# Patient Record
Sex: Female | Born: 1950
Health system: Southern US, Community
[De-identification: ages and names within clinical notes are randomized; demographics above are authoritative.]

## PROBLEM LIST (undated history)

## (undated) DIAGNOSIS — R011 Cardiac murmur, unspecified: Secondary | ICD-10-CM

## (undated) DIAGNOSIS — F419 Anxiety disorder, unspecified: Secondary | ICD-10-CM

## (undated) DIAGNOSIS — M1990 Unspecified osteoarthritis, unspecified site: Secondary | ICD-10-CM

## (undated) DIAGNOSIS — I10 Essential (primary) hypertension: Secondary | ICD-10-CM

## (undated) DIAGNOSIS — M109 Gout, unspecified: Secondary | ICD-10-CM

## (undated) DIAGNOSIS — M797 Fibromyalgia: Secondary | ICD-10-CM

## (undated) DIAGNOSIS — E119 Type 2 diabetes mellitus without complications: Secondary | ICD-10-CM

## (undated) DIAGNOSIS — T7840XA Allergy, unspecified, initial encounter: Secondary | ICD-10-CM

## (undated) DIAGNOSIS — E785 Hyperlipidemia, unspecified: Secondary | ICD-10-CM

## (undated) DIAGNOSIS — I6529 Occlusion and stenosis of unspecified carotid artery: Secondary | ICD-10-CM

## (undated) DIAGNOSIS — K219 Gastro-esophageal reflux disease without esophagitis: Secondary | ICD-10-CM

## (undated) HISTORY — PX: BREAST EXCISIONAL BIOPSY: SUR124

## (undated) HISTORY — DX: Fibromyalgia: M79.7

## (undated) HISTORY — PX: CHOLECYSTECTOMY: SHX55

## (undated) HISTORY — DX: Gastro-esophageal reflux disease without esophagitis: K21.9

## (undated) HISTORY — DX: Cardiac murmur, unspecified: R01.1

## (undated) HISTORY — DX: Anxiety disorder, unspecified: F41.9

## (undated) HISTORY — PX: JOINT REPLACEMENT: SHX530

## (undated) HISTORY — DX: Unspecified osteoarthritis, unspecified site: M19.90

## (undated) HISTORY — PX: EYE SURGERY: SHX253

## (undated) HISTORY — DX: Gout, unspecified: M10.9

## (undated) HISTORY — DX: Occlusion and stenosis of unspecified carotid artery: I65.29

## (undated) HISTORY — DX: Type 2 diabetes mellitus without complications: E11.9

## (undated) HISTORY — DX: Allergy, unspecified, initial encounter: T78.40XA

## (undated) HISTORY — PX: ABDOMINAL HYSTERECTOMY: SHX81

---
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
## 1998-12-06 ENCOUNTER — Other Ambulatory Visit: Admission: RE | Admit: 1998-12-06 | Discharge: 1998-12-06 | Payer: Self-pay | Admitting: Obstetrics and Gynecology

## 2000-05-03 ENCOUNTER — Emergency Department (HOSPITAL_COMMUNITY): Admission: EM | Admit: 2000-05-03 | Discharge: 2000-05-03 | Payer: Self-pay | Admitting: Internal Medicine

## 2004-04-30 ENCOUNTER — Emergency Department (HOSPITAL_COMMUNITY): Admission: EM | Admit: 2004-04-30 | Discharge: 2004-04-30 | Payer: Self-pay | Admitting: Emergency Medicine

## 2005-04-23 ENCOUNTER — Encounter: Admission: RE | Admit: 2005-04-23 | Discharge: 2005-04-23 | Payer: Self-pay | Admitting: General Surgery

## 2005-04-24 ENCOUNTER — Ambulatory Visit (HOSPITAL_BASED_OUTPATIENT_CLINIC_OR_DEPARTMENT_OTHER): Admission: RE | Admit: 2005-04-24 | Discharge: 2005-04-24 | Payer: Self-pay | Admitting: General Surgery

## 2005-04-24 ENCOUNTER — Ambulatory Visit (HOSPITAL_COMMUNITY): Admission: RE | Admit: 2005-04-24 | Discharge: 2005-04-24 | Payer: Self-pay | Admitting: General Surgery

## 2005-04-24 ENCOUNTER — Encounter (INDEPENDENT_AMBULATORY_CARE_PROVIDER_SITE_OTHER): Payer: Self-pay | Admitting: *Deleted

## 2006-06-26 ENCOUNTER — Ambulatory Visit: Payer: Self-pay | Admitting: Gastroenterology

## 2006-07-10 ENCOUNTER — Ambulatory Visit: Payer: Self-pay | Admitting: Gastroenterology

## 2007-12-15 ENCOUNTER — Encounter: Admission: RE | Admit: 2007-12-15 | Discharge: 2008-02-10 | Payer: Self-pay | Admitting: Internal Medicine

## 2009-02-04 ENCOUNTER — Ambulatory Visit: Payer: Self-pay | Admitting: Cardiovascular Disease

## 2009-02-05 ENCOUNTER — Encounter (INDEPENDENT_AMBULATORY_CARE_PROVIDER_SITE_OTHER): Payer: Self-pay | Admitting: *Deleted

## 2009-02-05 ENCOUNTER — Observation Stay (HOSPITAL_COMMUNITY): Admission: EM | Admit: 2009-02-05 | Discharge: 2009-02-06 | Payer: Self-pay | Admitting: Emergency Medicine

## 2009-06-03 ENCOUNTER — Encounter: Admission: RE | Admit: 2009-06-03 | Discharge: 2009-06-03 | Payer: Self-pay | Admitting: Internal Medicine

## 2009-08-09 ENCOUNTER — Ambulatory Visit (HOSPITAL_COMMUNITY): Admission: RE | Admit: 2009-08-09 | Discharge: 2009-08-10 | Payer: Self-pay | Admitting: Orthopedic Surgery

## 2010-04-10 IMAGING — CR DG CHEST 2V
2 series · 2 of 2 positions shown · non-contrast
Comparison: 04/23/2005

CLINICAL DATA: Chest pain for several days, worst tonight

CHEST - 2 VIEW

[w chest pa]
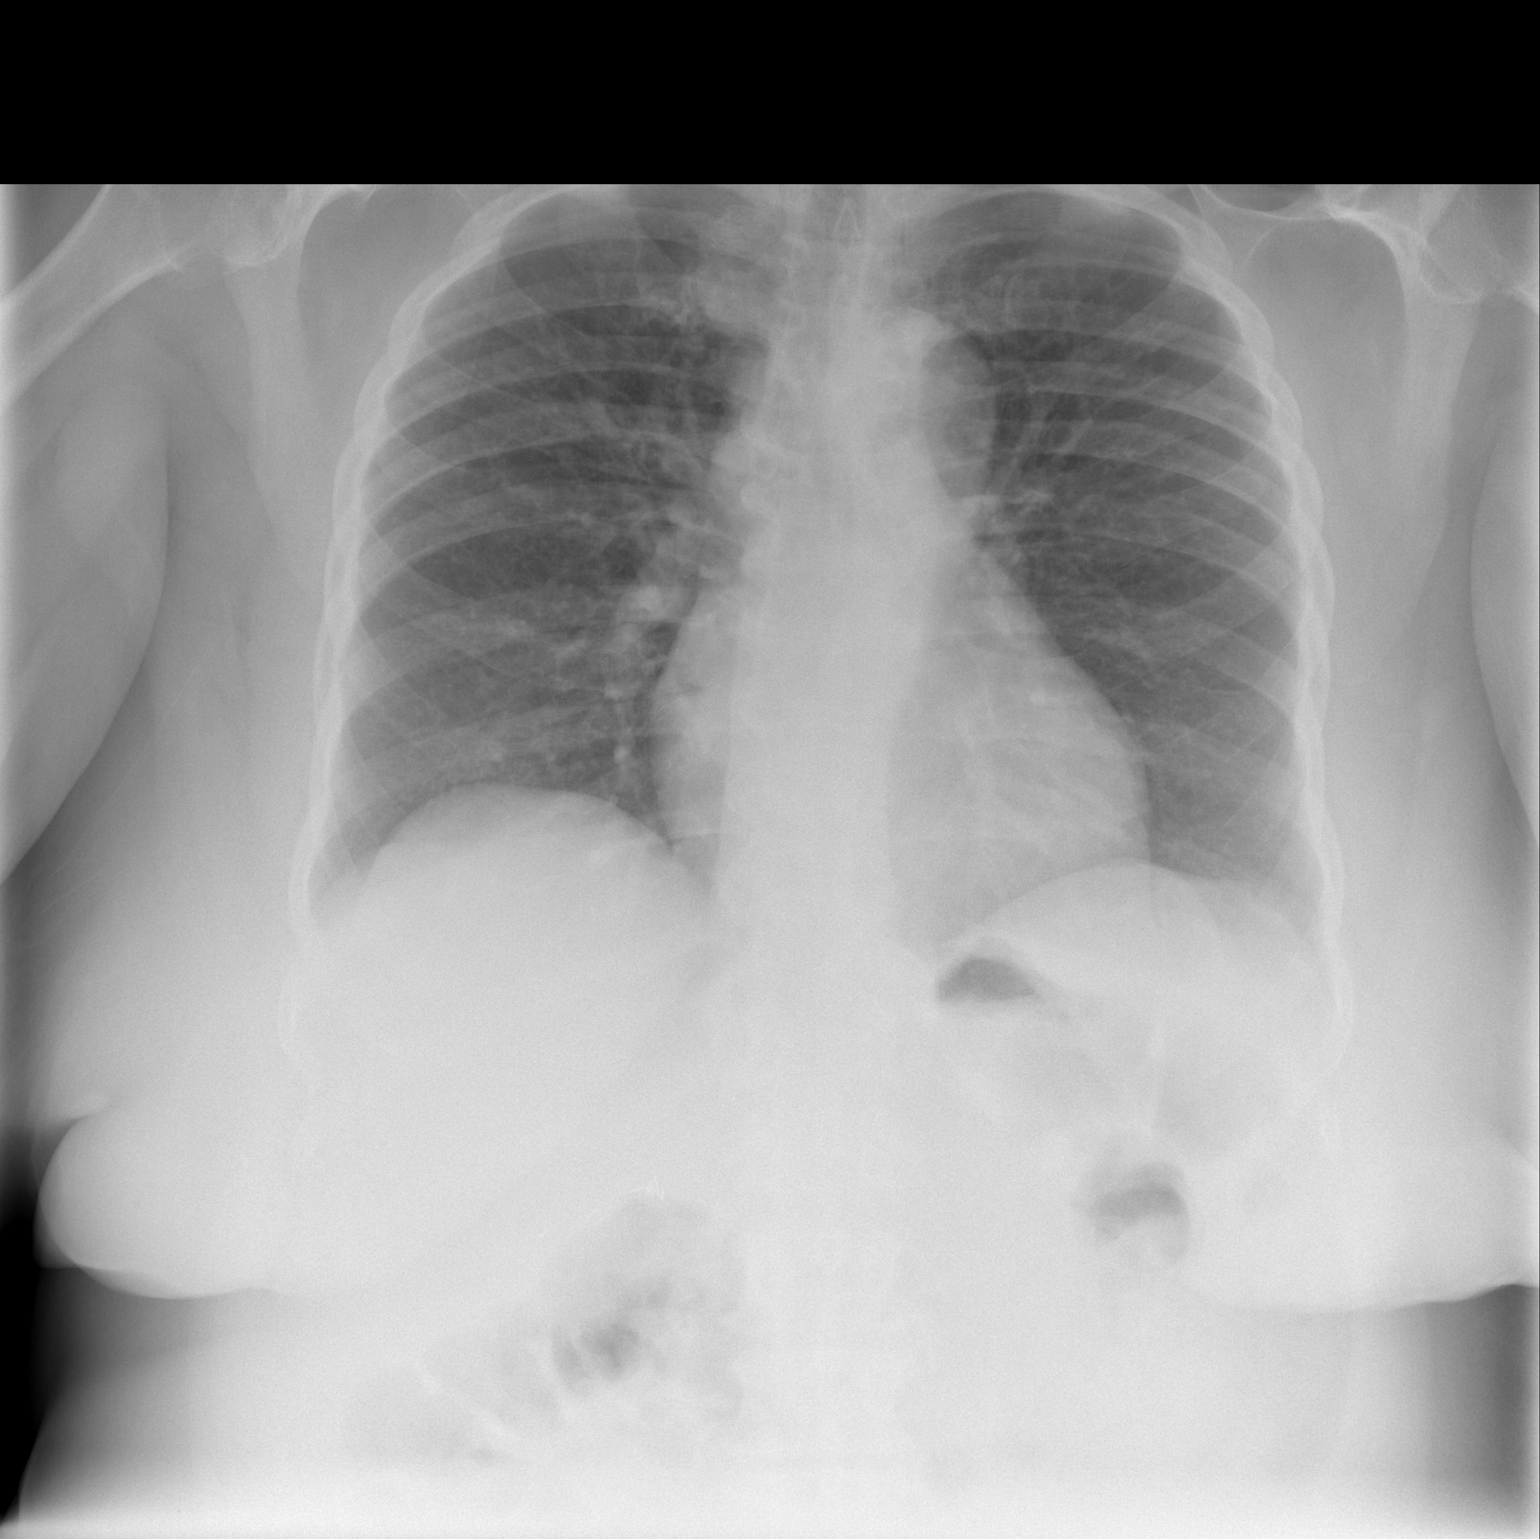

[w chest lat]
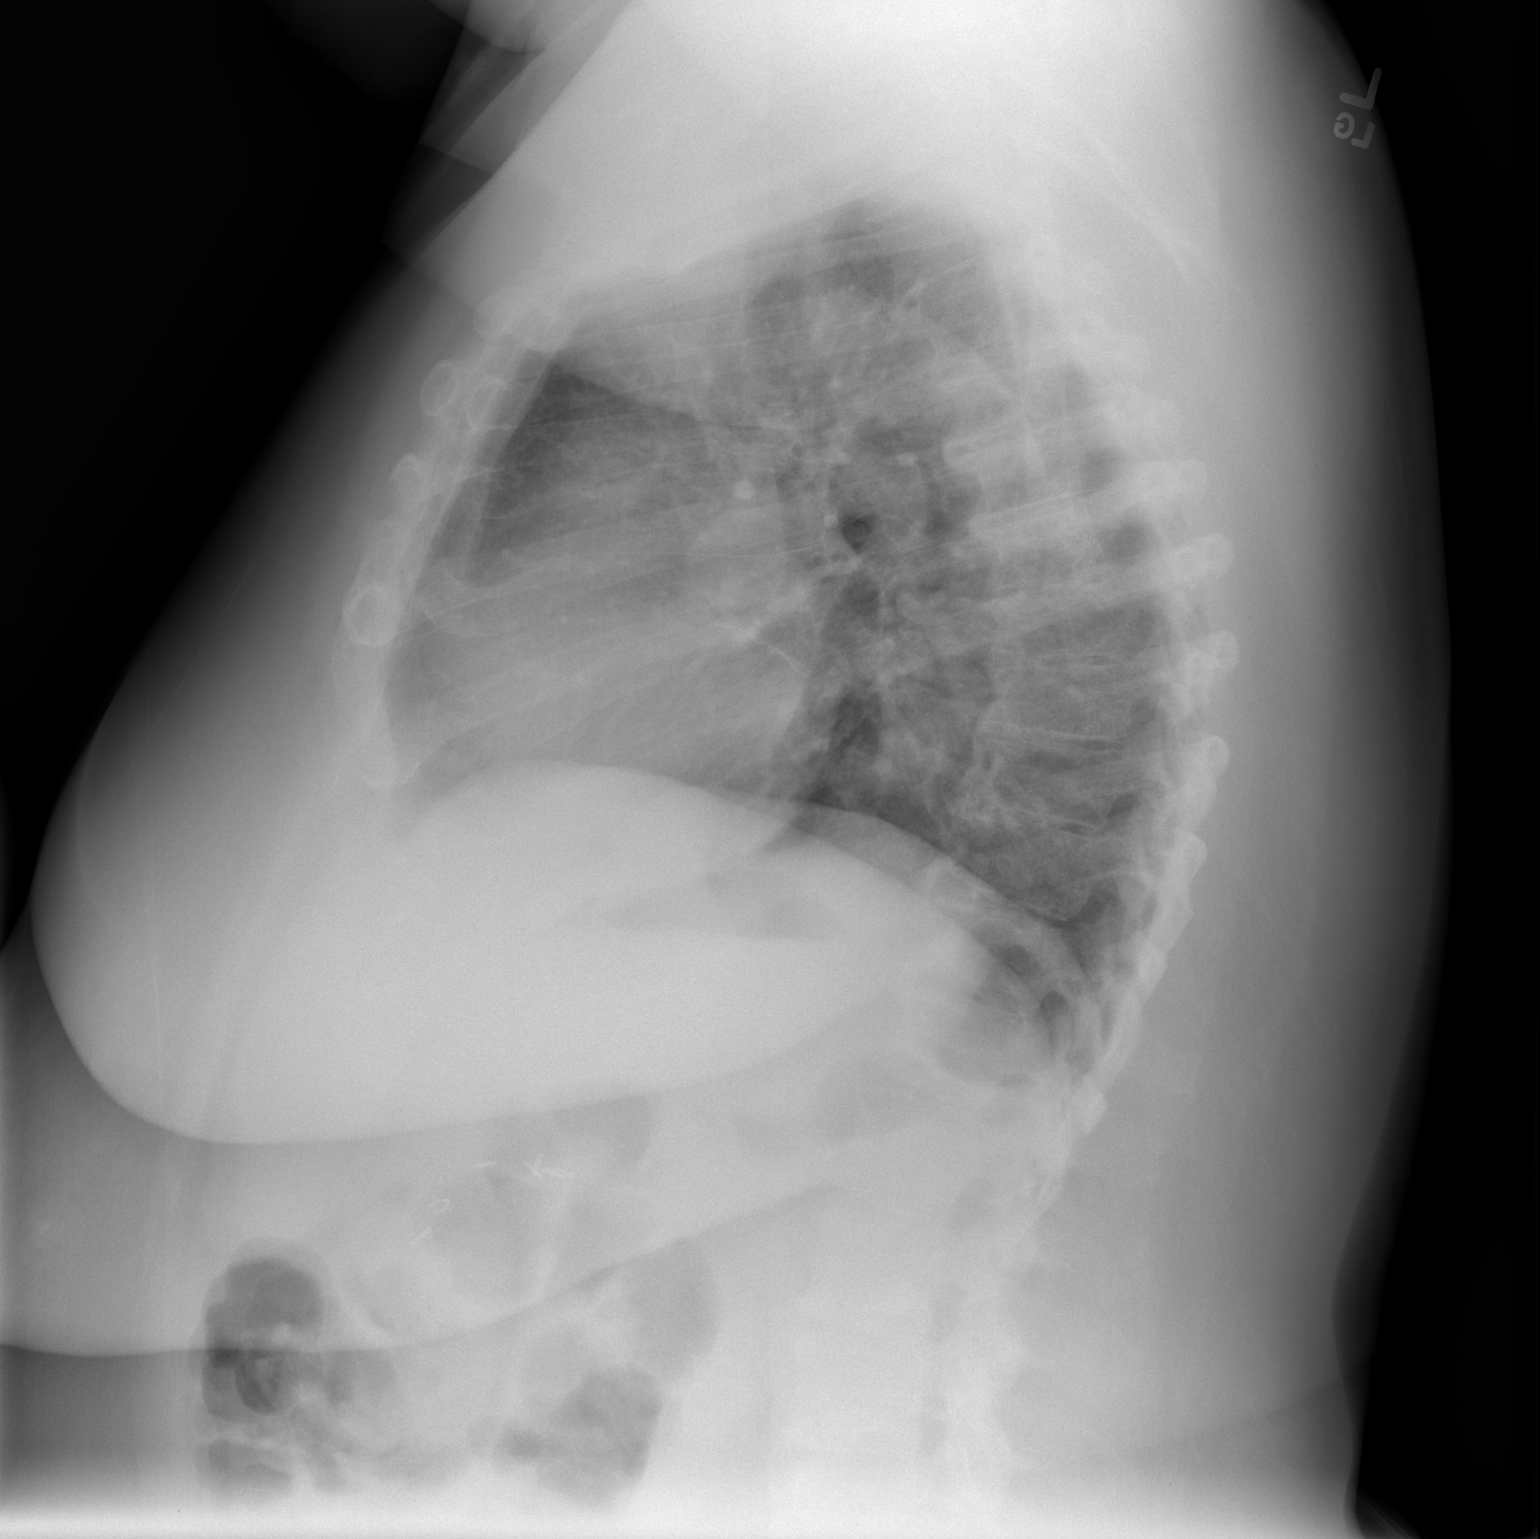

[2 of 2 positions shown; findings below may reference images not displayed]

FINDINGS: Mild cardiomegaly.  Mild vascular congestion without
overt failure or pneumonia.  Calcified tortuous aorta.  Bones
unremarkable. Previous cholecystectomy. Worsening aeration compared
with priors.
IMPRESSION: Cardiomegaly.  Mild vascular congestion without frank CHF.

## 2011-01-23 LAB — BASIC METABOLIC PANEL
CO2: 29 mEq/L (ref 19–32)
GFR calc Af Amer: >60 mL/min (ref >60)
Glucose, Bld: 138 mg/dL — ABNORMAL HIGH (ref 70–99)

## 2011-01-23 LAB — GLUCOSE, CAPILLARY: Glucose-Capillary: 112 mg/dL — ABNORMAL HIGH (ref 70–99)

## 2011-01-23 LAB — COMPREHENSIVE METABOLIC PANEL
ALT: 23 U/L (ref 0–35)
AST: 27 U/L (ref 0–37)
Albumin: 3.9 g/dL (ref 3.5–5.2)
Alkaline Phosphatase: 81 U/L (ref 39–117)
Calcium: 9.8 mg/dL (ref 8.4–10.5)
Creatinine, Ser: 0.82 mg/dL (ref 0.4–1.2)
GFR calc non Af Amer: >60 mL/min (ref >60)
Glucose, Bld: 80 mg/dL (ref 70–99)
Total Bilirubin: 0.4 mg/dL (ref 0.3–1.2)
Total Protein: 7.1 g/dL (ref 6.0–8.3)

## 2011-01-23 LAB — URINALYSIS, ROUTINE W REFLEX MICROSCOPIC
Glucose, UA: NEGATIVE mg/dL
Ketones, ur: NEGATIVE mg/dL
Nitrite: NEGATIVE
Protein, ur: NEGATIVE mg/dL
Specific Gravity, Urine: 1.018 (ref 1.005–1.030)
pH: 5.5 (ref 5.0–8.0)

## 2011-01-23 LAB — URINE MICROSCOPIC-ADD ON

## 2011-01-23 LAB — CBC: RDW: 14.1 % (ref 11.5–15.5)

## 2011-01-23 LAB — APTT: aPTT: 25 seconds (ref 24–37)

## 2011-01-29 LAB — LIPID PANEL
Cholesterol: 169 mg/dL (ref 0–200)
HDL: 35 mg/dL — ABNORMAL LOW (ref >39)
LDL Cholesterol: 115 mg/dL — ABNORMAL HIGH (ref 0–99)
Total CHOL/HDL Ratio: 4.8 RATIO
Triglycerides: 94 mg/dL (ref <150)

## 2011-01-29 LAB — BASIC METABOLIC PANEL
BUN: 7 mg/dL (ref 6–23)
CO2: 29 mEq/L (ref 19–32)
Calcium: 9.4 mg/dL (ref 8.4–10.5)
Glucose, Bld: 263 mg/dL — ABNORMAL HIGH (ref 70–99)
Sodium: 137 mEq/L (ref 135–145)

## 2011-01-29 LAB — GLUCOSE, CAPILLARY
Glucose-Capillary: 198 mg/dL — ABNORMAL HIGH (ref 70–99)
Glucose-Capillary: 263 mg/dL — ABNORMAL HIGH (ref 70–99)
Glucose-Capillary: 264 mg/dL — ABNORMAL HIGH (ref 70–99)

## 2011-01-29 LAB — URINE MICROSCOPIC-ADD ON

## 2011-01-29 LAB — COMPREHENSIVE METABOLIC PANEL
AST: 29 U/L (ref 0–37)
Alkaline Phosphatase: 115 U/L (ref 39–117)
Calcium: 9.2 mg/dL (ref 8.4–10.5)
Chloride: 102 mEq/L (ref 96–112)
Sodium: 137 mEq/L (ref 135–145)
Total Bilirubin: 1.2 mg/dL (ref 0.3–1.2)
Total Protein: 7 g/dL (ref 6.0–8.3)

## 2011-01-29 LAB — URINALYSIS, ROUTINE W REFLEX MICROSCOPIC
Glucose, UA: >1000 mg/dL — AB
Hgb urine dipstick: NEGATIVE
Leukocytes, UA: NEGATIVE
pH: 6 (ref 5.0–8.0)

## 2011-01-29 LAB — POCT CARDIAC MARKERS
CKMB, poc: 6.8 ng/mL (ref 1.0–8.0)
CKMB, poc: 8.7 ng/mL (ref 1.0–8.0)
Myoglobin, poc: 112 ng/mL (ref 12–200)
Troponin i, poc: <0.05 ng/mL (ref 0.00–0.09)

## 2011-01-29 LAB — CK TOTAL AND CKMB (NOT AT ARMC)
CK, MB: 9.3 ng/mL — ABNORMAL HIGH (ref 0.3–4.0)
Relative Index: 2.3 (ref 0.0–2.5)
Total CK: 403 U/L — ABNORMAL HIGH (ref 7–177)

## 2011-01-29 LAB — DIFFERENTIAL
Basophils Absolute: 0 10*3/uL (ref 0.0–0.1)
Basophils Relative: 0 % (ref 0–1)
Eosinophils Relative: 3 % (ref 0–5)
Monocytes Relative: 7 % (ref 3–12)
Neutro Abs: 4.8 10*3/uL (ref 1.7–7.7)

## 2011-01-29 LAB — CBC
Hemoglobin: 13.9 g/dL (ref 12.0–15.0)
Platelets: 214 10*3/uL (ref 150–400)
RBC: 4.59 MIL/uL (ref 3.87–5.11)
RDW: 13.6 % (ref 11.5–15.5)
WBC: 7.2 10*3/uL (ref 4.0–10.5)

## 2011-01-29 LAB — POCT I-STAT, CHEM 8
Chloride: 102 mEq/L (ref 96–112)
HCT: 46 % (ref 36.0–46.0)
Sodium: 138 mEq/L (ref 135–145)

## 2011-01-29 LAB — PROTIME-INR
INR: 1 (ref 0.00–1.49)
Prothrombin Time: 12.9 seconds (ref 11.6–15.2)

## 2011-01-29 LAB — CARDIAC PANEL(CRET KIN+CKTOT+MB+TROPI)
Relative Index: 2.4 (ref 0.0–2.5)
Troponin I: 0.01 ng/mL (ref 0.00–0.06)

## 2011-02-24 ENCOUNTER — Emergency Department (HOSPITAL_COMMUNITY): Payer: 59

## 2011-02-24 ENCOUNTER — Emergency Department (HOSPITAL_COMMUNITY)
Admission: EM | Admit: 2011-02-24 | Discharge: 2011-02-25 | Disposition: A | Discharge status: Home / Self Care | Payer: 59 | Attending: Emergency Medicine | Admitting: Emergency Medicine

## 2011-02-24 DIAGNOSIS — E119 Type 2 diabetes mellitus without complications: Secondary | ICD-10-CM | POA: Insufficient documentation

## 2011-02-24 DIAGNOSIS — R0609 Other forms of dyspnea: Secondary | ICD-10-CM | POA: Insufficient documentation

## 2011-02-24 DIAGNOSIS — R109 Unspecified abdominal pain: Secondary | ICD-10-CM | POA: Insufficient documentation

## 2011-02-24 DIAGNOSIS — I10 Essential (primary) hypertension: Secondary | ICD-10-CM | POA: Insufficient documentation

## 2011-02-24 DIAGNOSIS — E7800 Pure hypercholesterolemia, unspecified: Secondary | ICD-10-CM | POA: Insufficient documentation

## 2011-02-24 DIAGNOSIS — R0789 Other chest pain: Secondary | ICD-10-CM | POA: Insufficient documentation

## 2011-02-24 DIAGNOSIS — R748 Abnormal levels of other serum enzymes: Secondary | ICD-10-CM | POA: Insufficient documentation

## 2011-02-24 DIAGNOSIS — R0989 Other specified symptoms and signs involving the circulatory and respiratory systems: Secondary | ICD-10-CM | POA: Insufficient documentation

## 2011-02-24 DIAGNOSIS — R110 Nausea: Secondary | ICD-10-CM | POA: Insufficient documentation

## 2011-02-24 DIAGNOSIS — IMO0001 Reserved for inherently not codable concepts without codable children: Secondary | ICD-10-CM | POA: Insufficient documentation

## 2011-02-24 LAB — URINALYSIS, ROUTINE W REFLEX MICROSCOPIC
Bilirubin Urine: NEGATIVE
Nitrite: NEGATIVE
Specific Gravity, Urine: 1.021 (ref 1.005–1.030)
pH: 6 (ref 5.0–8.0)

## 2011-02-24 LAB — COMPREHENSIVE METABOLIC PANEL
ALT: 20 U/L (ref 0–35)
AST: 24 U/L (ref 0–37)
Calcium: 10.4 mg/dL (ref 8.4–10.5)
GFR calc Af Amer: >60 mL/min (ref >60)
Sodium: 135 mEq/L (ref 135–145)
Total Protein: 8.6 g/dL — ABNORMAL HIGH (ref 6.0–8.3)

## 2011-02-24 LAB — POCT CARDIAC MARKERS
CKMB, poc: 14.4 ng/mL (ref 1.0–8.0)
Myoglobin, poc: 295 ng/mL (ref 12–200)

## 2011-02-24 LAB — CK TOTAL AND CKMB (NOT AT ARMC): Relative Index: 2.1 (ref 0.0–2.5)

## 2011-02-24 LAB — DIFFERENTIAL
Basophils Absolute: 0 10*3/uL (ref 0.0–0.1)
Lymphocytes Relative: 33 % (ref 12–46)
Monocytes Relative: 9 % (ref 3–12)
Neutro Abs: 4.5 10*3/uL (ref 1.7–7.7)

## 2011-02-24 LAB — CBC
HCT: 40.8 % (ref 36.0–46.0)
Hemoglobin: 14 g/dL (ref 12.0–15.0)
WBC: 8 10*3/uL (ref 4.0–10.5)

## 2011-02-24 LAB — TROPONIN I: Troponin I: <0.3 ng/mL (ref <0.30)

## 2011-02-24 LAB — LIPASE, BLOOD: Lipase: 116 U/L — ABNORMAL HIGH (ref 11–59)

## 2011-02-25 ENCOUNTER — Encounter (HOSPITAL_COMMUNITY): Payer: Self-pay

## 2011-02-25 ENCOUNTER — Emergency Department (HOSPITAL_COMMUNITY): Payer: 59

## 2011-02-25 MED ORDER — IOHEXOL 300 MG/ML  SOLN
100.0000 mL | Freq: Once | INTRAMUSCULAR | Status: AC | PRN
  Administered 2011-02-25: 100 mL via INTRAVENOUS

## 2011-03-04 NOTE — H&P (Signed)
NAMEMarland Peters  NAYSHA, SHOLL NO.:  [id number]   MEDICAL RECORD NO.:  [id number]          PATIENT TYPE:  INP   LOCATION:                               FACILITY:  [facility]   PHYSICIAN:  Christell Faith, MD   DATE OF BIRTH:  02-27-1951   DATE OF ADMISSION:  02/05/2009  DATE OF DISCHARGE:                              HISTORY & PHYSICAL   PRIMARY CARE PHYSICIAN:  Pearson Grippe.   CARDIOLOGIST:  Previously unassigned and will be admitted to Dr. Lady Deutscher.   CHIEF COMPLAINT:  Chest pain.   HISTORY OF PRESENT ILLNESS:  This is a 60 year old African American  female who has been having intermittent left-sided chest pressure since  yesterday evening.  It is described as a catching feeling in her chest  that evolves into a pressure associated with shortness of breath.  The  discomfort radiates around the left side of her chest and down to her  flank.  At its worst tonight it was 8/10 in severity and the patient was  so uncomfortable she could not sleep.  She has had similar symptoms in  the past but never so severe.  In the emergency department she reports  that nitroglycerin was beneficial.  She is currently pain free.   PAST MEDICAL HISTORY:  1. Diabetes mellitus type 2, diagnosed 2 years ago.  2. Hypertension.  3. Gout.  4. Depression.  5. Fibromyalgia.  6. History of total abdominal hysterectomy.  7. History of cholecystectomy.  8. Chronic CK enzyme elevation.  Status post muscle biopsy, negative      for polymyositis 2 years ago.   SOCIAL HISTORY:  Lives in [place] with her husband.  She works at a  call center.  She smokes one pack a day of cigarettes, alcohol use is  rare.  No regular exercise.   FAMILY HISTORY:  Mother is alive with diabetes, she has also had a valve  replacement.  Father is alive with borderline diabetes.   REVIEW OF SYSTEMS:  Positive for recent URI treated with antibiotics and  positive for indigestion.   ALLERGIES:  NONE.    MEDICINES:  1. Metformin 500 mg p.o. daily.  2. Glipizide 10 mg p.o. t.i.d.  3. Pravachol 40 mg p.o. daily.  4. Lotrel 10/40 mg p.o. daily.  5. HCTZ 25 mg p.o. daily.  6. Allopurinol 100 mg p.o. daily.  7. Paxil 20 mg p.o. daily.  8. Meloxicam 15 mg p.o. daily.  9. Aspirin 81 mg p.o. daily.   PHYSICAL EXAM:  Temperature 97.5, pulse 88, respiratory rate 12, blood  pressure 141/84, saturation 99% on room air, weight 258 pounds.  GENERAL:  This is a very pleasant obese African American female in no  acute distress.  Pupils are round and reactive.  Sclerae are clear.  Mucous membranes are  moist.  Dentition is good.  NECK:  Supple, neck veins are flat, no carotid bruits, no cervical  adenopathy, no thyromegaly.  CARDIAC:  Normal rate, regular rhythm, no murmurs, 2+ dorsalis pedis and  radial pulses bilaterally.  LUNGS:  Clear to auscultation  bilaterally without wheezing or rales.  ABDOMEN:  Obese, soft, nontender, nondistended.  EXTREMITIES:  No edema, no rash, no petechiae.  NEUROLOGIC:  Awake, alert, oriented x3, 5/5 strength in all four  extremities, sensation is intact.  Facial expressions are normal and  symmetric.   DIAGNOSTIC TESTS:  Chest x-ray shows mild cardiomegaly with mild  vascular congestion but no edema.  EKG shows sinus rhythm 85 beats a  minute, no ST or T-wave changes.   LABS:  White blood cell 7.2, hemoglobin 13.9, platelets 214, sodium 138,  potassium 4.9, BUN 15, creatinine 0.8, glucose 273, AST 29, ALT 25, alk  phos 115, CK 403, CK-MB 9.3, troponin 0.02.   IMPRESSION:  A 60 year old African American female with multiple cardiac  risk factors and atypical chest pain.   PLAN:  1. Admit to Dr. Reyes Ivan to oupatient observation unit, monitor on      telemetry.  2. Rule out myocardial infarction by cycling serial EKGs, cardiac      enzymes.  3. Treat with aspirin and therapeutic Lovenox for the time being.  4. Check fasting lipid panel and continue statin.   5. We will hold her metformin while she is in the hospital, continue      glipizide and cover with insulin as needed.  6. Continue her home antihypertensives Lotrel and hydrochlorothiazide.  7. Tobacco cessation will be stressed.  8. She will need a stress test either an inpatient or outpatient.      Christell Faith, MD  Electronically Signed     NDL/MEDQ  D:  02/05/2009  T:  02/05/2009  Job:  [id number]

## 2011-03-07 NOTE — Discharge Summary (Signed)
Brandi Peters, MAIDEN               ACCOUNT NO.:  [id number]   MEDICAL RECORD NO.:  [id number]          PATIENT TYPE:  OBV   LOCATION:  1420                         FACILITY:  [facility]   PHYSICIAN:  Elmore Guise., M.D.DATE OF BIRTH:  16-Jan-1951   DATE OF ADMISSION:  02/04/2009  DATE OF DISCHARGE:  02/06/2009                               DISCHARGE SUMMARY   DISCHARGE DIAGNOSES:  1. Atypical chest pain.  2. Obesity.  3. Diabetes mellitus.  4. Hypertension.  5. Dyslipidemia.   A 59 year old [place] American female with multiple medical problems who  presented with off and on chest discomfort.  She was admitted for  observation.   HOSPITAL COURSE:  Her hospital course was uncomplicated.  She ruled out  for myocardial infarction.  Her ECG was normal.  She had an  echocardiogram performed which showed normal LV size and function with  an EF of 55-60%.  She had no wall motion abnormalities.  She was noted  to have mild aortic valve insufficiency.  She became chest-pain-free on  arrival.  She was treated with nitroglycerin and morphine.  She has now  been up and ambulatory with no further problems.  She has had no further  chest pain.  No orthostatic symptoms.  Her telemetry has shown normal  sinus rhythm.   DISCHARGE MEDICATIONS:  1. Paxil 20 mg daily.  2. Metformin 500 mg daily.  3. Allopurinol 100 mg daily.  4. Glipizide 10 mg three times daily.  5. Lotrel 10/40 mg daily.  6. Pravachol 40 mg daily.  7. Meloxicam 15 mg daily.  8. HCTZ 25 mg daily.   These are all as before.   Her new medications include:   1. Aspirin 81 mg daily.  2. Imdur 30 mg daily.   FOLLOWUP:  She will follow up with Dr. Reyes Ivan in two weeks.  If her  symptoms continue, she will be scheduled for outpatient stress test.  She may return to her normal work activities next Monday, April 26.      Elmore Guise., M.D.  Electronically Signed     TWK/MEDQ  D:  02/07/2009  T:  02/07/2009  Job:   [id number]   cc:   Dr. Selena Batten, [facility]

## 2011-03-07 NOTE — Op Note (Signed)
Brandi Peters, Brandi Peters               ACCOUNT NO.:  [id number]   MEDICAL RECORD NO.:  [id number]          PATIENT TYPE:  AMB   LOCATION:  DSC                          FACILITY:  [facility]   PHYSICIAN:  Angelia Mould. Derrell Lolling, M.D.DATE OF BIRTH:  [birth year]   DATE OF PROCEDURE:  04/24/2005  DATE OF DISCHARGE:                                 OPERATIVE REPORT   PREOPERATIVE DIAGNOSIS:  Polymyositis.   POSTOPERATIVE DIAGNOSIS:  Polymyositis.   OPERATION PERFORMED:  Left thigh muscle biopsy (quadriceps).   SURGEON:  Angelia Mould. Derrell Lolling, M.D.   OPERATIVE INDICATIONS:  A 60 year old black female with morbid obesity. She  has numerous rheumatologic complaints. Laboratory work shows an elevated CPK  and positive SSB antibiotic consistent with polymyositis. She has had  medication trials which were not effective. Dr.  Phylliss Bob has requested a left  thigh quadriceps muscle biopsy. She is brought to operating room electively.   OPERATIVE TECHNIQUE:  The patient was brought to operating room and placed  on the operating table. She was monitored and sedated by the anesthesia  department. The left thigh was prepped and draped in a sterile fashion. One  percent Xylocaine with epinephrine was used as local infiltration  anesthetic. A vertically oriented, sagittally oriented incision was made in  the left anterior thigh slightly lateral to the midline of the thigh.  Dissection was carried down through the subcutaneous tissue, which was quite  thick, until we encountered the deep investing fascia of the quadriceps  muscle. We incised the fascia, investing the quadriceps muscle  longitudinally and exposing the muscle. Self-retaining retractors were  placed.   We dissected out and excised three separate muscle specimens. Each of these  specimens was 2.5-3 cm long and about 1 cm in width. The muscles were  clamped proximally and distally with hemostats and the muscle excised. The  muscle was fixed to a tongue blade  with staples at full length, placed in  moistened gauze and sent to the laboratory. The cut ends of the muscle were  ligated with 2-0 Vicryl ties. After all three specimens were obtained, the  wound  was inspected. There was no bleeding. The wound was irrigated with saline.  The deep subcuticular was closed with skin staples. Clean bandages were  placed and the patient was taken to the recovery room in stable condition.  The estimated blood loss was about 10-15 mL. The sponge, needle and  instrument counts were correct.       HMI/MEDQ  D:  04/24/2005  T:  04/24/2005  Job:  [id number]   cc:   Areatha Keas, M.D.  [address]  [address]  [place]  [place] [address]  Fax: [phone]   Janae Bridgeman. Eloise Harman., M.D.  [address]  [place]  [place] [address]  Fax: [phone]

## 2012-04-29 IMAGING — CT CT ABD-PELV W/ CM
2 of 5 series · 17 of 46 positions shown, 19 images · IV contrast (omnipaque)
Comparison: None

CLINICAL DATA: Upper abdominal pain.  WBC 8.0.  Elevated lipase.
Prior cholecystectomy and hysterectomy.

CT ABDOMEN AND PELVIS WITH CONTRAST
TECHNIQUE: Multidetector CT imaging of the abdomen and pelvis was
performed following the standard protocol during bolus
administration of intravenous contrast.
Contrast: 100 ml Omnipaque 300

[Series 2: rtn ap with st · axial · 0.68mm/px · z∈[+916,+1276]mm · 14 of 80 slices shown, 16 images]
[im 4/80  soft-tissue]
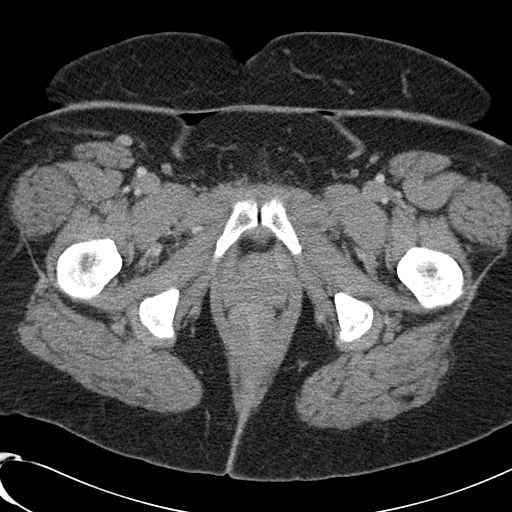
[im 4/80  bone]
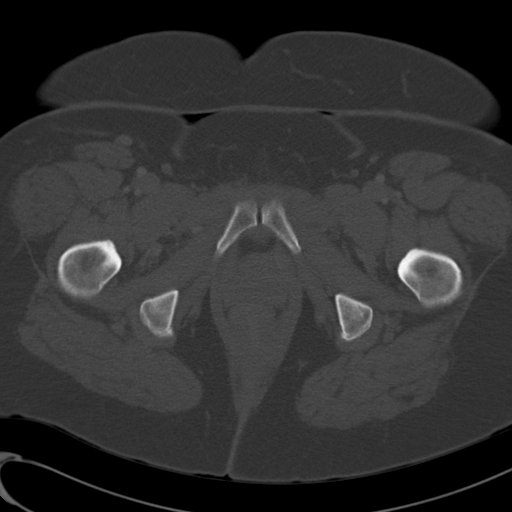
[im 12/80  soft-tissue]
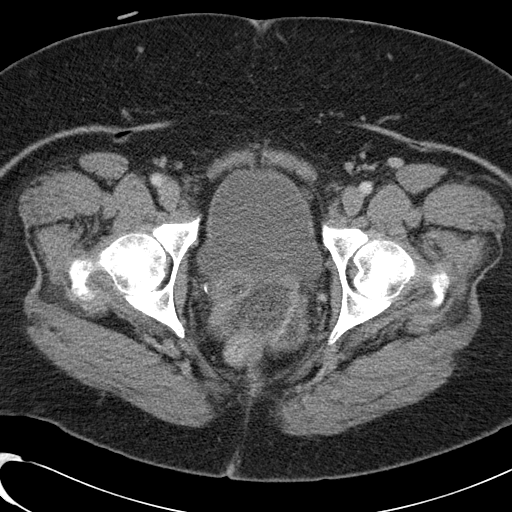
[im 16/80  soft-tissue]
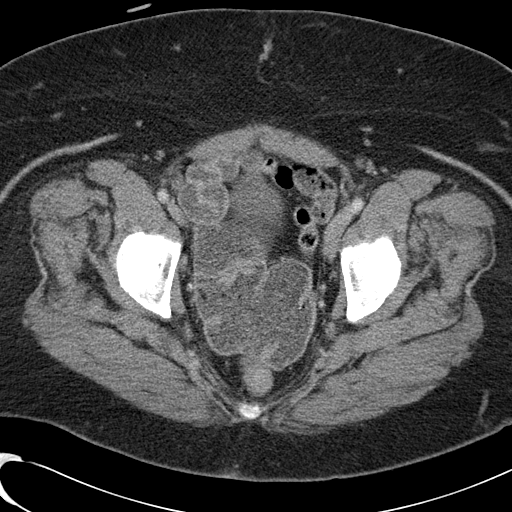
[im 20/80  soft-tissue]
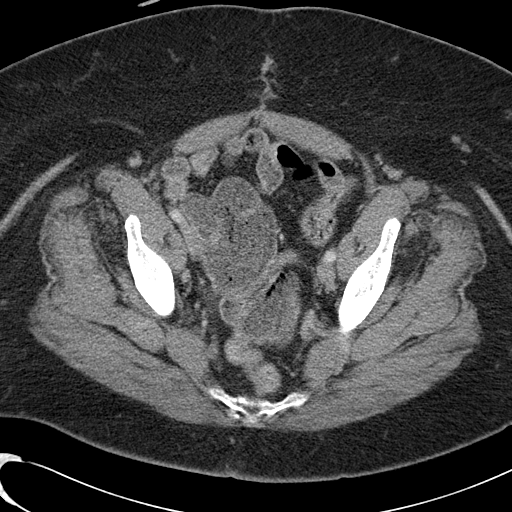
[im 28/80  soft-tissue]
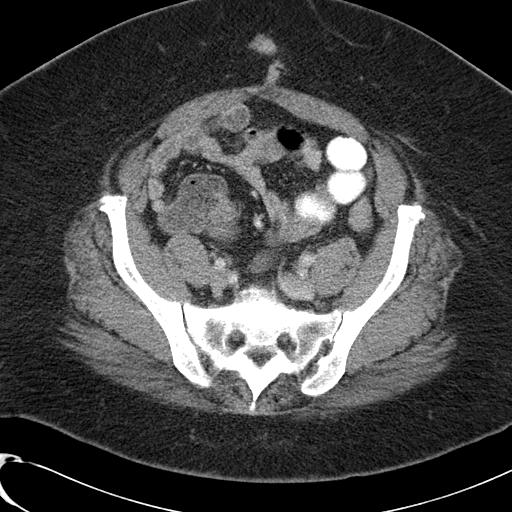
[im 32/80  soft-tissue]
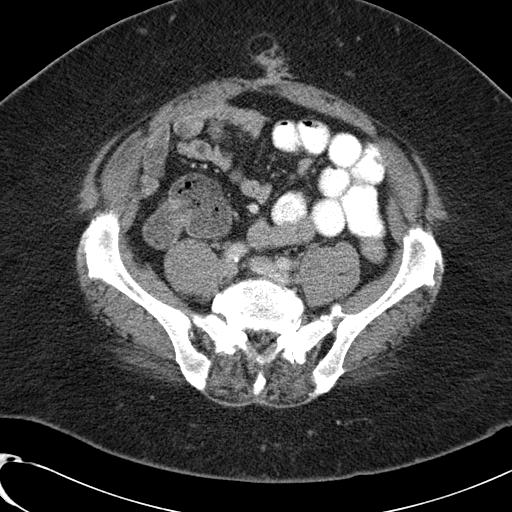
[im 36/80  soft-tissue]
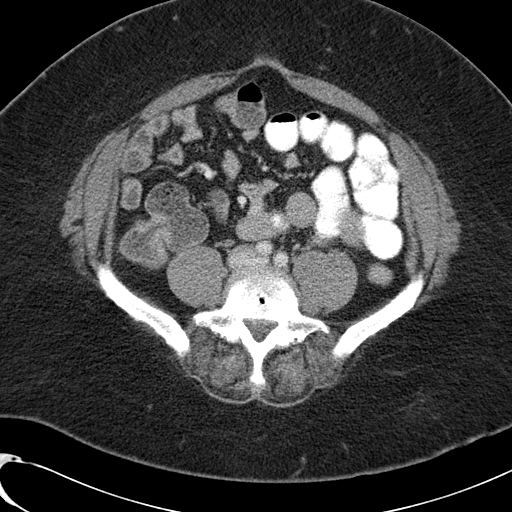
[im 44/80  soft-tissue]
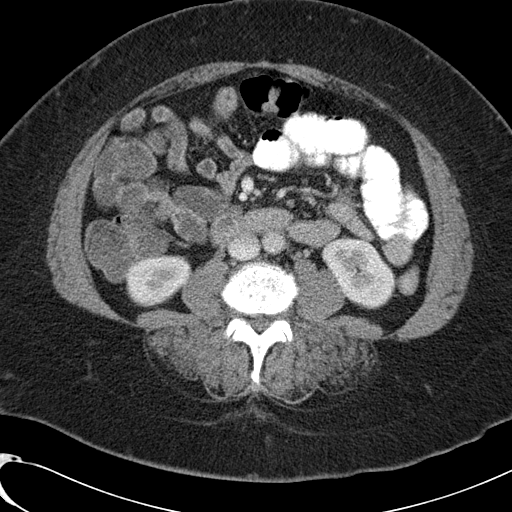
[im 48/80  soft-tissue]
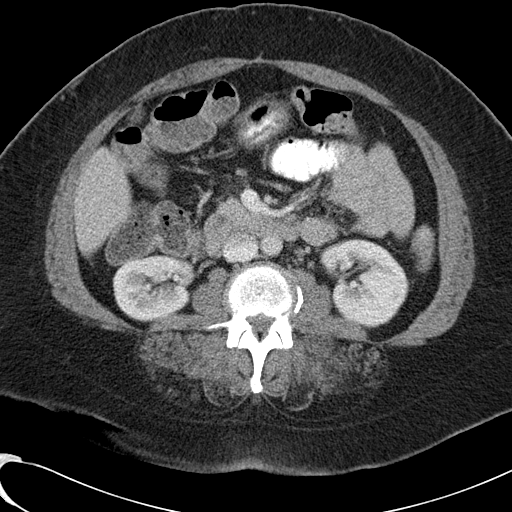
[im 48/80  bone]
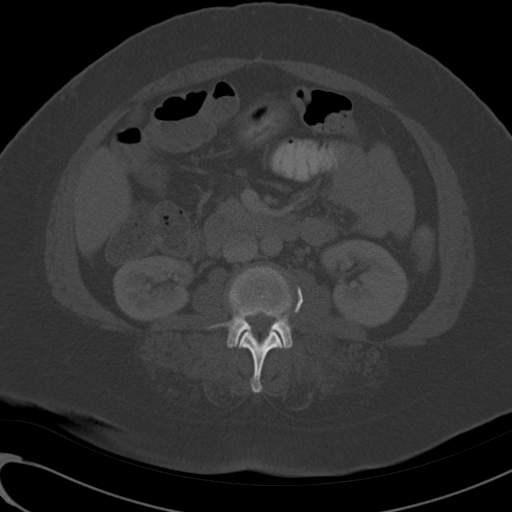
[im 52/80  soft-tissue]
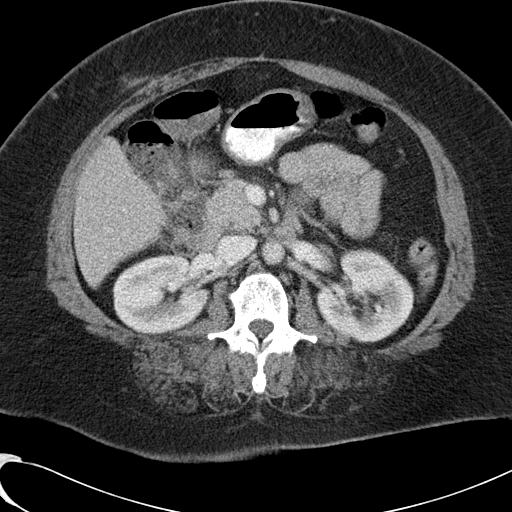
[im 60/80  soft-tissue]
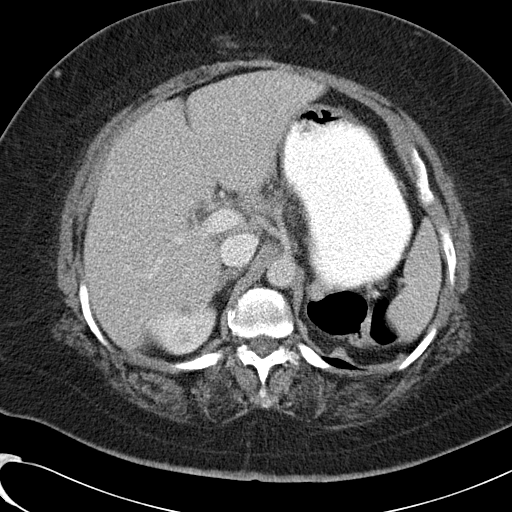
[im 64/80  soft-tissue]
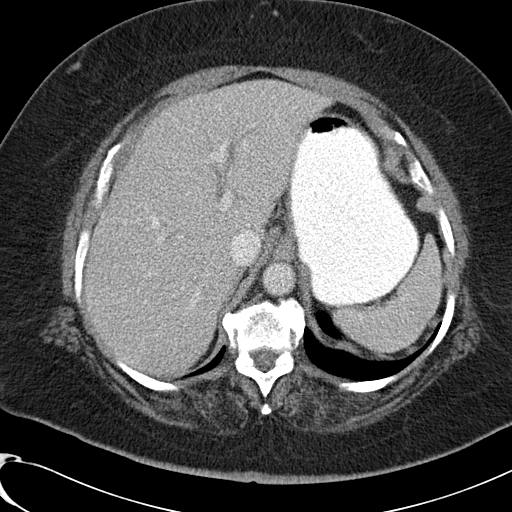
[im 68/80  soft-tissue]
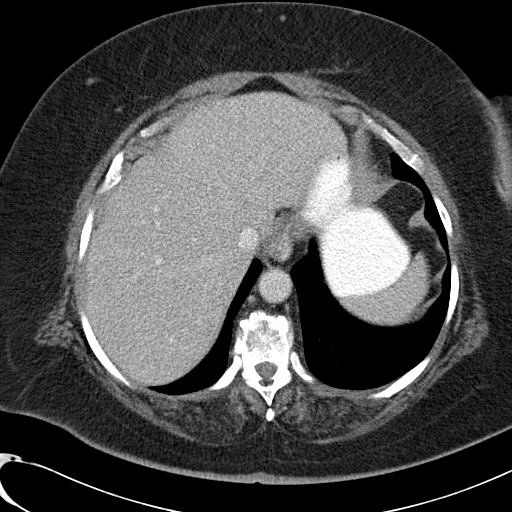
[im 76/80  soft-tissue]
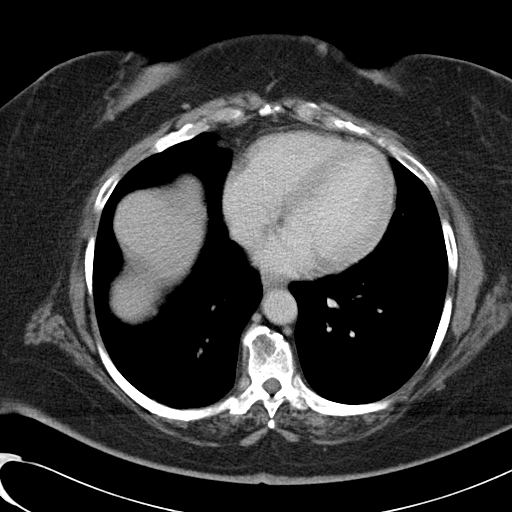

[Series 602: <mpr thick range> · coronal · 0.82mm/px · 3 of 86 slices shown]
[im 29/86  soft-tissue]
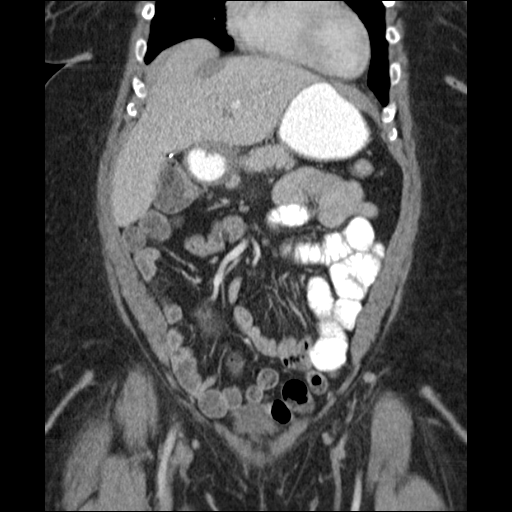
[im 38/86  soft-tissue]
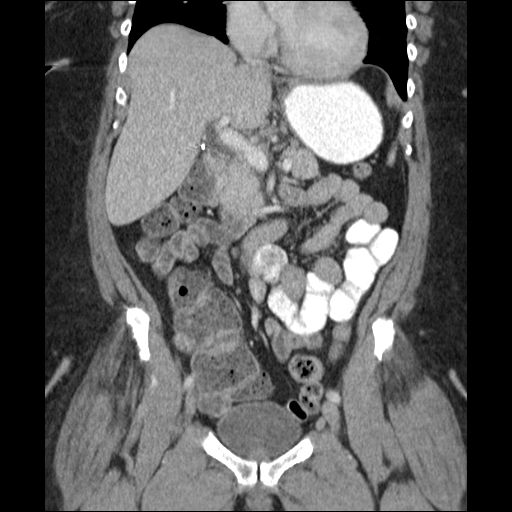
[im 48/86  soft-tissue]
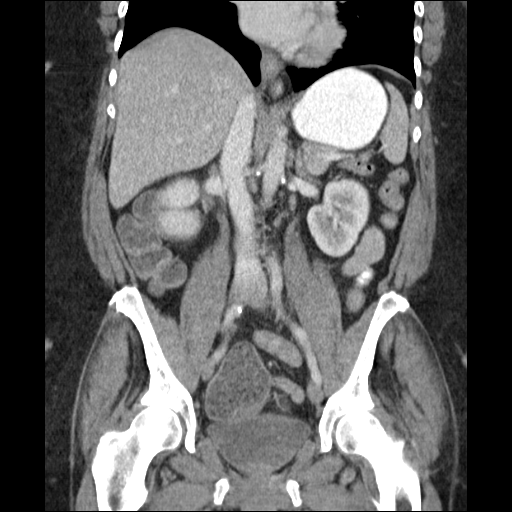

[17 of 46 positions shown; findings below may reference images not displayed]

FINDINGS: Lung bases are clear.  Surgical absence of the
gallbladder.  No bile duct dilatation.  The pancreas is
unremarkable.  No peripancreatic infiltration or edema.  Spleen
size is normal.  Small accessory spleens.  No adrenal gland
nodules.  Kidneys are symmetrical without solid mass or
hydronephrosis.  Normal caliber abdominal aorta.  Stomach and small
bowel are not distended.  No abdominal ascites or free air.  Small
periumbilical hernia containing fat.

Pelvis:  The bladder wall is not thickened.  Surgical absence of
the uterus.  The appendix is not demonstrated but no inflammatory
changes are identified.Mild degenerative changes in the spine.
IMPRESSION: Pancreatic parenchyma is homogeneous and no inflammatory changes
are demonstrated around the pancreas.  Surgical absence of the
gallbladder.  No bile duct dilatation.  Small periumbilical hernia
containing fat.

## 2012-06-10 ENCOUNTER — Other Ambulatory Visit: Payer: Self-pay | Admitting: Obstetrics and Gynecology

## 2012-06-18 ENCOUNTER — Other Ambulatory Visit: Payer: Self-pay | Admitting: Obstetrics and Gynecology

## 2012-06-18 DIAGNOSIS — R928 Other abnormal and inconclusive findings on diagnostic imaging of breast: Secondary | ICD-10-CM

## 2012-06-23 ENCOUNTER — Ambulatory Visit
Admission: RE | Admit: 2012-06-23 | Discharge: 2012-06-23 | Disposition: A | Discharge status: Home / Self Care | Payer: 59 | Source: Ambulatory Visit | Attending: Obstetrics and Gynecology | Admitting: Obstetrics and Gynecology

## 2012-06-23 ENCOUNTER — Other Ambulatory Visit: Payer: Self-pay | Admitting: Obstetrics and Gynecology

## 2012-06-23 DIAGNOSIS — R928 Other abnormal and inconclusive findings on diagnostic imaging of breast: Secondary | ICD-10-CM

## 2012-06-23 DIAGNOSIS — R921 Mammographic calcification found on diagnostic imaging of breast: Secondary | ICD-10-CM

## 2012-06-23 HISTORY — PX: BREAST BIOPSY: SHX20

## 2012-06-24 ENCOUNTER — Ambulatory Visit
Admission: RE | Admit: 2012-06-24 | Discharge: 2012-06-24 | Disposition: A | Discharge status: Home / Self Care | Payer: 59 | Source: Ambulatory Visit | Attending: Obstetrics and Gynecology | Admitting: Obstetrics and Gynecology

## 2012-06-24 DIAGNOSIS — R921 Mammographic calcification found on diagnostic imaging of breast: Secondary | ICD-10-CM

## 2014-11-22 ENCOUNTER — Other Ambulatory Visit: Payer: Self-pay | Admitting: Obstetrics and Gynecology

## 2014-11-22 DIAGNOSIS — R928 Other abnormal and inconclusive findings on diagnostic imaging of breast: Secondary | ICD-10-CM

## 2014-11-22 DIAGNOSIS — R921 Mammographic calcification found on diagnostic imaging of breast: Secondary | ICD-10-CM

## 2014-11-30 ENCOUNTER — Ambulatory Visit
Admission: RE | Admit: 2014-11-30 | Discharge: 2014-11-30 | Disposition: A | Discharge status: Home / Self Care | Payer: 59 | Source: Ambulatory Visit | Attending: Obstetrics and Gynecology | Admitting: Obstetrics and Gynecology

## 2014-11-30 DIAGNOSIS — R928 Other abnormal and inconclusive findings on diagnostic imaging of breast: Secondary | ICD-10-CM

## 2014-11-30 DIAGNOSIS — R921 Mammographic calcification found on diagnostic imaging of breast: Secondary | ICD-10-CM

## 2015-05-24 DIAGNOSIS — R0602 Shortness of breath: Secondary | ICD-10-CM | POA: Diagnosis not present

## 2015-05-24 DIAGNOSIS — I10 Essential (primary) hypertension: Secondary | ICD-10-CM | POA: Diagnosis not present

## 2015-06-07 DIAGNOSIS — I4510 Unspecified right bundle-branch block: Secondary | ICD-10-CM | POA: Diagnosis not present

## 2015-06-07 DIAGNOSIS — R0609 Other forms of dyspnea: Secondary | ICD-10-CM | POA: Diagnosis not present

## 2015-06-07 DIAGNOSIS — E1165 Type 2 diabetes mellitus with hyperglycemia: Secondary | ICD-10-CM | POA: Diagnosis not present

## 2015-06-07 DIAGNOSIS — R0989 Other specified symptoms and signs involving the circulatory and respiratory systems: Secondary | ICD-10-CM | POA: Diagnosis not present

## 2015-06-14 DIAGNOSIS — E780 Pure hypercholesterolemia: Secondary | ICD-10-CM | POA: Diagnosis not present

## 2015-06-14 DIAGNOSIS — R635 Abnormal weight gain: Secondary | ICD-10-CM | POA: Diagnosis not present

## 2015-06-14 DIAGNOSIS — E119 Type 2 diabetes mellitus without complications: Secondary | ICD-10-CM | POA: Diagnosis not present

## 2015-06-14 DIAGNOSIS — I10 Essential (primary) hypertension: Secondary | ICD-10-CM | POA: Diagnosis not present

## 2015-09-12 DIAGNOSIS — E7800 Pure hypercholesterolemia, unspecified: Secondary | ICD-10-CM | POA: Diagnosis not present

## 2015-09-12 DIAGNOSIS — E119 Type 2 diabetes mellitus without complications: Secondary | ICD-10-CM | POA: Diagnosis not present

## 2015-09-12 DIAGNOSIS — I10 Essential (primary) hypertension: Secondary | ICD-10-CM | POA: Diagnosis not present

## 2015-09-12 DIAGNOSIS — R635 Abnormal weight gain: Secondary | ICD-10-CM | POA: Diagnosis not present

## 2015-09-19 DIAGNOSIS — M549 Dorsalgia, unspecified: Secondary | ICD-10-CM | POA: Diagnosis not present

## 2015-09-19 DIAGNOSIS — I10 Essential (primary) hypertension: Secondary | ICD-10-CM | POA: Diagnosis not present

## 2015-09-19 DIAGNOSIS — Z23 Encounter for immunization: Secondary | ICD-10-CM | POA: Diagnosis not present

## 2015-09-19 DIAGNOSIS — E7800 Pure hypercholesterolemia, unspecified: Secondary | ICD-10-CM | POA: Diagnosis not present

## 2015-09-19 DIAGNOSIS — E119 Type 2 diabetes mellitus without complications: Secondary | ICD-10-CM | POA: Diagnosis not present

## 2015-11-08 DIAGNOSIS — H25011 Cortical age-related cataract, right eye: Secondary | ICD-10-CM | POA: Diagnosis not present

## 2015-11-08 DIAGNOSIS — H2511 Age-related nuclear cataract, right eye: Secondary | ICD-10-CM | POA: Diagnosis not present

## 2015-11-08 DIAGNOSIS — H2513 Age-related nuclear cataract, bilateral: Secondary | ICD-10-CM | POA: Diagnosis not present

## 2015-11-08 DIAGNOSIS — H25013 Cortical age-related cataract, bilateral: Secondary | ICD-10-CM | POA: Diagnosis not present

## 2015-11-08 DIAGNOSIS — E119 Type 2 diabetes mellitus without complications: Secondary | ICD-10-CM | POA: Diagnosis not present

## 2015-11-14 DIAGNOSIS — H25012 Cortical age-related cataract, left eye: Secondary | ICD-10-CM | POA: Diagnosis not present

## 2015-11-14 DIAGNOSIS — H2512 Age-related nuclear cataract, left eye: Secondary | ICD-10-CM | POA: Diagnosis not present

## 2015-11-14 DIAGNOSIS — H25011 Cortical age-related cataract, right eye: Secondary | ICD-10-CM | POA: Diagnosis not present

## 2015-11-14 DIAGNOSIS — H2511 Age-related nuclear cataract, right eye: Secondary | ICD-10-CM | POA: Diagnosis not present

## 2015-11-21 DIAGNOSIS — H25012 Cortical age-related cataract, left eye: Secondary | ICD-10-CM | POA: Diagnosis not present

## 2015-11-21 DIAGNOSIS — H2512 Age-related nuclear cataract, left eye: Secondary | ICD-10-CM | POA: Diagnosis not present

## 2015-12-12 DIAGNOSIS — E662 Morbid (severe) obesity with alveolar hypoventilation: Secondary | ICD-10-CM | POA: Diagnosis not present

## 2015-12-12 DIAGNOSIS — E1165 Type 2 diabetes mellitus with hyperglycemia: Secondary | ICD-10-CM | POA: Diagnosis not present

## 2015-12-12 DIAGNOSIS — E7800 Pure hypercholesterolemia, unspecified: Secondary | ICD-10-CM | POA: Diagnosis not present

## 2015-12-12 DIAGNOSIS — R0609 Other forms of dyspnea: Secondary | ICD-10-CM | POA: Diagnosis not present

## 2016-03-26 DIAGNOSIS — M545 Low back pain: Secondary | ICD-10-CM | POA: Diagnosis not present

## 2016-03-26 DIAGNOSIS — M1288 Other specific arthropathies, not elsewhere classified, other specified site: Secondary | ICD-10-CM | POA: Diagnosis not present

## 2016-03-26 DIAGNOSIS — M5136 Other intervertebral disc degeneration, lumbar region: Secondary | ICD-10-CM | POA: Diagnosis not present

## 2016-03-27 DIAGNOSIS — Z961 Presence of intraocular lens: Secondary | ICD-10-CM | POA: Diagnosis not present

## 2016-04-09 DIAGNOSIS — M5136 Other intervertebral disc degeneration, lumbar region: Secondary | ICD-10-CM | POA: Diagnosis not present

## 2016-04-29 DIAGNOSIS — M5136 Other intervertebral disc degeneration, lumbar region: Secondary | ICD-10-CM | POA: Diagnosis not present

## 2016-05-09 DIAGNOSIS — M5136 Other intervertebral disc degeneration, lumbar region: Secondary | ICD-10-CM | POA: Diagnosis not present

## 2016-05-21 DIAGNOSIS — M545 Low back pain: Secondary | ICD-10-CM | POA: Diagnosis not present

## 2016-05-21 DIAGNOSIS — M5136 Other intervertebral disc degeneration, lumbar region: Secondary | ICD-10-CM | POA: Diagnosis not present

## 2016-05-21 DIAGNOSIS — M1288 Other specific arthropathies, not elsewhere classified, other specified site: Secondary | ICD-10-CM | POA: Diagnosis not present

## 2016-06-24 DIAGNOSIS — I10 Essential (primary) hypertension: Secondary | ICD-10-CM | POA: Diagnosis not present

## 2016-06-24 DIAGNOSIS — E7800 Pure hypercholesterolemia, unspecified: Secondary | ICD-10-CM | POA: Diagnosis not present

## 2016-06-24 DIAGNOSIS — E119 Type 2 diabetes mellitus without complications: Secondary | ICD-10-CM | POA: Diagnosis not present

## 2016-07-01 DIAGNOSIS — I10 Essential (primary) hypertension: Secondary | ICD-10-CM | POA: Diagnosis not present

## 2016-07-01 DIAGNOSIS — E119 Type 2 diabetes mellitus without complications: Secondary | ICD-10-CM | POA: Diagnosis not present

## 2016-07-01 DIAGNOSIS — E7800 Pure hypercholesterolemia, unspecified: Secondary | ICD-10-CM | POA: Diagnosis not present

## 2016-08-01 DIAGNOSIS — I10 Essential (primary) hypertension: Secondary | ICD-10-CM | POA: Diagnosis not present

## 2016-08-01 DIAGNOSIS — E119 Type 2 diabetes mellitus without complications: Secondary | ICD-10-CM | POA: Diagnosis not present

## 2016-08-01 DIAGNOSIS — E7800 Pure hypercholesterolemia, unspecified: Secondary | ICD-10-CM | POA: Diagnosis not present

## 2016-10-27 DIAGNOSIS — I10 Essential (primary) hypertension: Secondary | ICD-10-CM | POA: Diagnosis not present

## 2016-10-27 DIAGNOSIS — E119 Type 2 diabetes mellitus without complications: Secondary | ICD-10-CM | POA: Diagnosis not present

## 2016-10-27 DIAGNOSIS — E7800 Pure hypercholesterolemia, unspecified: Secondary | ICD-10-CM | POA: Diagnosis not present

## 2016-10-30 DIAGNOSIS — Z0000 Encounter for general adult medical examination without abnormal findings: Secondary | ICD-10-CM | POA: Diagnosis not present

## 2016-10-30 DIAGNOSIS — E119 Type 2 diabetes mellitus without complications: Secondary | ICD-10-CM | POA: Diagnosis not present

## 2016-10-30 DIAGNOSIS — E7800 Pure hypercholesterolemia, unspecified: Secondary | ICD-10-CM | POA: Diagnosis not present

## 2016-10-30 DIAGNOSIS — I10 Essential (primary) hypertension: Secondary | ICD-10-CM | POA: Diagnosis not present

## 2017-02-10 ENCOUNTER — Other Ambulatory Visit: Payer: Self-pay | Admitting: Internal Medicine

## 2017-02-10 DIAGNOSIS — Z1231 Encounter for screening mammogram for malignant neoplasm of breast: Secondary | ICD-10-CM

## 2017-03-05 ENCOUNTER — Ambulatory Visit
Admission: RE | Admit: 2017-03-05 | Discharge: 2017-03-05 | Disposition: A | Discharge status: Home / Self Care | Payer: Medicare Other | Source: Ambulatory Visit | Attending: Internal Medicine | Admitting: Internal Medicine

## 2017-03-05 DIAGNOSIS — Z1231 Encounter for screening mammogram for malignant neoplasm of breast: Secondary | ICD-10-CM | POA: Diagnosis not present

## 2017-03-26 DIAGNOSIS — L218 Other seborrheic dermatitis: Secondary | ICD-10-CM | POA: Diagnosis not present

## 2017-03-26 DIAGNOSIS — L309 Dermatitis, unspecified: Secondary | ICD-10-CM | POA: Diagnosis not present

## 2017-04-16 DIAGNOSIS — L218 Other seborrheic dermatitis: Secondary | ICD-10-CM | POA: Diagnosis not present

## 2017-05-04 DIAGNOSIS — E119 Type 2 diabetes mellitus without complications: Secondary | ICD-10-CM | POA: Diagnosis not present

## 2017-05-04 DIAGNOSIS — I10 Essential (primary) hypertension: Secondary | ICD-10-CM | POA: Diagnosis not present

## 2017-05-04 DIAGNOSIS — R5383 Other fatigue: Secondary | ICD-10-CM | POA: Diagnosis not present

## 2017-05-08 DIAGNOSIS — E7800 Pure hypercholesterolemia, unspecified: Secondary | ICD-10-CM | POA: Diagnosis not present

## 2017-05-08 DIAGNOSIS — I10 Essential (primary) hypertension: Secondary | ICD-10-CM | POA: Diagnosis not present

## 2017-05-08 DIAGNOSIS — Z23 Encounter for immunization: Secondary | ICD-10-CM | POA: Diagnosis not present

## 2017-05-08 DIAGNOSIS — E119 Type 2 diabetes mellitus without complications: Secondary | ICD-10-CM | POA: Diagnosis not present

## 2017-05-27 DIAGNOSIS — I351 Nonrheumatic aortic (valve) insufficiency: Secondary | ICD-10-CM | POA: Diagnosis not present

## 2017-06-03 DIAGNOSIS — I351 Nonrheumatic aortic (valve) insufficiency: Secondary | ICD-10-CM | POA: Diagnosis not present

## 2017-06-03 DIAGNOSIS — R0989 Other specified symptoms and signs involving the circulatory and respiratory systems: Secondary | ICD-10-CM | POA: Diagnosis not present

## 2017-06-03 DIAGNOSIS — R0609 Other forms of dyspnea: Secondary | ICD-10-CM | POA: Diagnosis not present

## 2017-06-03 DIAGNOSIS — I10 Essential (primary) hypertension: Secondary | ICD-10-CM | POA: Diagnosis not present

## 2017-07-07 DIAGNOSIS — R0989 Other specified symptoms and signs involving the circulatory and respiratory systems: Secondary | ICD-10-CM | POA: Diagnosis not present

## 2017-08-10 DIAGNOSIS — E119 Type 2 diabetes mellitus without complications: Secondary | ICD-10-CM | POA: Diagnosis not present

## 2017-08-10 DIAGNOSIS — E7800 Pure hypercholesterolemia, unspecified: Secondary | ICD-10-CM | POA: Diagnosis not present

## 2017-08-17 DIAGNOSIS — E7800 Pure hypercholesterolemia, unspecified: Secondary | ICD-10-CM | POA: Diagnosis not present

## 2017-08-17 DIAGNOSIS — G8929 Other chronic pain: Secondary | ICD-10-CM | POA: Diagnosis not present

## 2017-08-17 DIAGNOSIS — E119 Type 2 diabetes mellitus without complications: Secondary | ICD-10-CM | POA: Diagnosis not present

## 2017-08-17 DIAGNOSIS — I10 Essential (primary) hypertension: Secondary | ICD-10-CM | POA: Diagnosis not present

## 2017-08-17 DIAGNOSIS — Z23 Encounter for immunization: Secondary | ICD-10-CM | POA: Diagnosis not present

## 2017-08-17 DIAGNOSIS — M545 Low back pain: Secondary | ICD-10-CM | POA: Diagnosis not present

## 2017-08-25 DIAGNOSIS — M545 Low back pain: Secondary | ICD-10-CM | POA: Diagnosis not present

## 2017-08-25 DIAGNOSIS — E119 Type 2 diabetes mellitus without complications: Secondary | ICD-10-CM | POA: Diagnosis not present

## 2017-08-25 DIAGNOSIS — G8929 Other chronic pain: Secondary | ICD-10-CM | POA: Diagnosis not present

## 2017-08-25 DIAGNOSIS — I10 Essential (primary) hypertension: Secondary | ICD-10-CM | POA: Diagnosis not present

## 2017-08-25 DIAGNOSIS — Z5181 Encounter for therapeutic drug level monitoring: Secondary | ICD-10-CM | POA: Diagnosis not present

## 2017-08-27 DIAGNOSIS — Z6841 Body Mass Index (BMI) 40.0 and over, adult: Secondary | ICD-10-CM | POA: Diagnosis not present

## 2017-08-27 DIAGNOSIS — I10 Essential (primary) hypertension: Secondary | ICD-10-CM | POA: Diagnosis not present

## 2017-08-27 DIAGNOSIS — M5126 Other intervertebral disc displacement, lumbar region: Secondary | ICD-10-CM | POA: Diagnosis not present

## 2017-08-27 DIAGNOSIS — M4150 Other secondary scoliosis, site unspecified: Secondary | ICD-10-CM | POA: Diagnosis not present

## 2017-09-24 DIAGNOSIS — M545 Low back pain: Secondary | ICD-10-CM | POA: Diagnosis not present

## 2017-09-24 DIAGNOSIS — G8929 Other chronic pain: Secondary | ICD-10-CM | POA: Diagnosis not present

## 2017-10-29 DIAGNOSIS — E7800 Pure hypercholesterolemia, unspecified: Secondary | ICD-10-CM | POA: Diagnosis not present

## 2017-10-29 DIAGNOSIS — M545 Low back pain: Secondary | ICD-10-CM | POA: Diagnosis not present

## 2017-10-29 DIAGNOSIS — I10 Essential (primary) hypertension: Secondary | ICD-10-CM | POA: Diagnosis not present

## 2017-10-29 DIAGNOSIS — G8929 Other chronic pain: Secondary | ICD-10-CM | POA: Diagnosis not present

## 2017-10-29 DIAGNOSIS — E119 Type 2 diabetes mellitus without complications: Secondary | ICD-10-CM | POA: Diagnosis not present

## 2017-12-04 DIAGNOSIS — M48061 Spinal stenosis, lumbar region without neurogenic claudication: Secondary | ICD-10-CM | POA: Diagnosis not present

## 2017-12-04 DIAGNOSIS — M5136 Other intervertebral disc degeneration, lumbar region: Secondary | ICD-10-CM | POA: Diagnosis not present

## 2017-12-15 DIAGNOSIS — M5136 Other intervertebral disc degeneration, lumbar region: Secondary | ICD-10-CM | POA: Diagnosis not present

## 2017-12-15 DIAGNOSIS — M48061 Spinal stenosis, lumbar region without neurogenic claudication: Secondary | ICD-10-CM | POA: Diagnosis not present

## 2018-01-06 DIAGNOSIS — M5416 Radiculopathy, lumbar region: Secondary | ICD-10-CM | POA: Diagnosis not present

## 2018-01-06 DIAGNOSIS — M48061 Spinal stenosis, lumbar region without neurogenic claudication: Secondary | ICD-10-CM | POA: Diagnosis not present

## 2018-01-14 DIAGNOSIS — M5416 Radiculopathy, lumbar region: Secondary | ICD-10-CM | POA: Diagnosis not present

## 2018-01-14 DIAGNOSIS — M545 Low back pain: Secondary | ICD-10-CM | POA: Diagnosis not present

## 2018-01-27 DIAGNOSIS — M5136 Other intervertebral disc degeneration, lumbar region: Secondary | ICD-10-CM | POA: Diagnosis not present

## 2018-01-27 DIAGNOSIS — M545 Low back pain: Secondary | ICD-10-CM | POA: Diagnosis not present

## 2018-02-03 DIAGNOSIS — M545 Low back pain: Secondary | ICD-10-CM | POA: Diagnosis not present

## 2018-02-12 DIAGNOSIS — M545 Low back pain: Secondary | ICD-10-CM | POA: Diagnosis not present

## 2018-02-17 DIAGNOSIS — M5416 Radiculopathy, lumbar region: Secondary | ICD-10-CM | POA: Diagnosis not present

## 2018-02-24 DIAGNOSIS — M5416 Radiculopathy, lumbar region: Secondary | ICD-10-CM | POA: Diagnosis not present

## 2018-03-03 DIAGNOSIS — M5416 Radiculopathy, lumbar region: Secondary | ICD-10-CM | POA: Diagnosis not present

## 2018-03-04 ENCOUNTER — Other Ambulatory Visit: Payer: Self-pay | Admitting: Internal Medicine

## 2018-03-04 DIAGNOSIS — Z1231 Encounter for screening mammogram for malignant neoplasm of breast: Secondary | ICD-10-CM

## 2018-03-22 DIAGNOSIS — E119 Type 2 diabetes mellitus without complications: Secondary | ICD-10-CM | POA: Diagnosis not present

## 2018-03-22 DIAGNOSIS — I10 Essential (primary) hypertension: Secondary | ICD-10-CM | POA: Diagnosis not present

## 2018-03-22 DIAGNOSIS — N390 Urinary tract infection, site not specified: Secondary | ICD-10-CM | POA: Diagnosis not present

## 2018-03-26 ENCOUNTER — Ambulatory Visit
Admission: RE | Admit: 2018-03-26 | Discharge: 2018-03-26 | Disposition: A | Discharge status: Home / Self Care | Payer: Medicare Other | Source: Ambulatory Visit | Attending: Internal Medicine | Admitting: Internal Medicine

## 2018-03-26 DIAGNOSIS — Z1231 Encounter for screening mammogram for malignant neoplasm of breast: Secondary | ICD-10-CM | POA: Diagnosis not present

## 2018-03-29 DIAGNOSIS — Z0000 Encounter for general adult medical examination without abnormal findings: Secondary | ICD-10-CM | POA: Diagnosis not present

## 2018-03-29 DIAGNOSIS — E119 Type 2 diabetes mellitus without complications: Secondary | ICD-10-CM | POA: Diagnosis not present

## 2018-03-29 DIAGNOSIS — I10 Essential (primary) hypertension: Secondary | ICD-10-CM | POA: Diagnosis not present

## 2018-03-29 DIAGNOSIS — E7800 Pure hypercholesterolemia, unspecified: Secondary | ICD-10-CM | POA: Diagnosis not present

## 2018-06-28 DIAGNOSIS — E119 Type 2 diabetes mellitus without complications: Secondary | ICD-10-CM | POA: Diagnosis not present

## 2018-06-28 DIAGNOSIS — I10 Essential (primary) hypertension: Secondary | ICD-10-CM | POA: Diagnosis not present

## 2018-08-02 DIAGNOSIS — R011 Cardiac murmur, unspecified: Secondary | ICD-10-CM | POA: Diagnosis not present

## 2018-08-02 DIAGNOSIS — M5136 Other intervertebral disc degeneration, lumbar region: Secondary | ICD-10-CM | POA: Diagnosis not present

## 2018-08-02 DIAGNOSIS — E119 Type 2 diabetes mellitus without complications: Secondary | ICD-10-CM | POA: Diagnosis not present

## 2018-08-02 DIAGNOSIS — I10 Essential (primary) hypertension: Secondary | ICD-10-CM | POA: Diagnosis not present

## 2018-08-02 DIAGNOSIS — N183 Chronic kidney disease, stage 3 (moderate): Secondary | ICD-10-CM | POA: Diagnosis not present

## 2018-08-02 DIAGNOSIS — Z23 Encounter for immunization: Secondary | ICD-10-CM | POA: Diagnosis not present

## 2018-08-09 DIAGNOSIS — I6521 Occlusion and stenosis of right carotid artery: Secondary | ICD-10-CM | POA: Diagnosis not present

## 2018-08-09 DIAGNOSIS — R0989 Other specified symptoms and signs involving the circulatory and respiratory systems: Secondary | ICD-10-CM | POA: Diagnosis not present

## 2018-08-23 DIAGNOSIS — I351 Nonrheumatic aortic (valve) insufficiency: Secondary | ICD-10-CM | POA: Diagnosis not present

## 2018-08-23 DIAGNOSIS — I10 Essential (primary) hypertension: Secondary | ICD-10-CM | POA: Diagnosis not present

## 2018-08-23 DIAGNOSIS — R0609 Other forms of dyspnea: Secondary | ICD-10-CM | POA: Diagnosis not present

## 2018-08-23 DIAGNOSIS — R0989 Other specified symptoms and signs involving the circulatory and respiratory systems: Secondary | ICD-10-CM | POA: Diagnosis not present

## 2018-09-10 DIAGNOSIS — R0602 Shortness of breath: Secondary | ICD-10-CM | POA: Diagnosis not present

## 2018-09-10 DIAGNOSIS — R609 Edema, unspecified: Secondary | ICD-10-CM | POA: Diagnosis not present

## 2018-09-13 DIAGNOSIS — R0609 Other forms of dyspnea: Secondary | ICD-10-CM | POA: Diagnosis not present

## 2018-09-13 DIAGNOSIS — R0989 Other specified symptoms and signs involving the circulatory and respiratory systems: Secondary | ICD-10-CM | POA: Diagnosis not present

## 2018-09-13 DIAGNOSIS — I351 Nonrheumatic aortic (valve) insufficiency: Secondary | ICD-10-CM | POA: Diagnosis not present

## 2018-09-13 DIAGNOSIS — I10 Essential (primary) hypertension: Secondary | ICD-10-CM | POA: Diagnosis not present

## 2018-11-24 DIAGNOSIS — E119 Type 2 diabetes mellitus without complications: Secondary | ICD-10-CM | POA: Diagnosis not present

## 2018-11-24 DIAGNOSIS — N183 Chronic kidney disease, stage 3 (moderate): Secondary | ICD-10-CM | POA: Diagnosis not present

## 2018-12-01 DIAGNOSIS — M5136 Other intervertebral disc degeneration, lumbar region: Secondary | ICD-10-CM | POA: Diagnosis not present

## 2018-12-01 DIAGNOSIS — E119 Type 2 diabetes mellitus without complications: Secondary | ICD-10-CM | POA: Diagnosis not present

## 2018-12-01 DIAGNOSIS — I10 Essential (primary) hypertension: Secondary | ICD-10-CM | POA: Diagnosis not present

## 2018-12-01 DIAGNOSIS — R6889 Other general symptoms and signs: Secondary | ICD-10-CM | POA: Diagnosis not present

## 2018-12-01 DIAGNOSIS — J988 Other specified respiratory disorders: Secondary | ICD-10-CM | POA: Diagnosis not present

## 2018-12-15 ENCOUNTER — Other Ambulatory Visit: Payer: Self-pay | Admitting: Cardiology

## 2018-12-15 DIAGNOSIS — R0989 Other specified symptoms and signs involving the circulatory and respiratory systems: Secondary | ICD-10-CM

## 2019-01-12 ENCOUNTER — Other Ambulatory Visit: Payer: Self-pay | Admitting: Cardiology

## 2019-02-08 ENCOUNTER — Other Ambulatory Visit: Payer: Self-pay

## 2019-03-24 ENCOUNTER — Other Ambulatory Visit: Payer: Self-pay

## 2019-03-24 ENCOUNTER — Encounter: Payer: Self-pay | Admitting: Cardiology

## 2019-03-24 ENCOUNTER — Ambulatory Visit (INDEPENDENT_AMBULATORY_CARE_PROVIDER_SITE_OTHER): Payer: Medicare Other | Admitting: Cardiology

## 2019-03-24 VITALS — Ht 63.0 in | Wt 240.0 lb

## 2019-03-24 DIAGNOSIS — I10 Essential (primary) hypertension: Secondary | ICD-10-CM | POA: Diagnosis not present

## 2019-03-24 DIAGNOSIS — I6521 Occlusion and stenosis of right carotid artery: Secondary | ICD-10-CM | POA: Diagnosis not present

## 2019-03-24 DIAGNOSIS — E7800 Pure hypercholesterolemia, unspecified: Secondary | ICD-10-CM | POA: Diagnosis not present

## 2019-03-24 DIAGNOSIS — E118 Type 2 diabetes mellitus with unspecified complications: Secondary | ICD-10-CM | POA: Diagnosis not present

## 2019-03-24 DIAGNOSIS — E785 Hyperlipidemia, unspecified: Secondary | ICD-10-CM | POA: Insufficient documentation

## 2019-03-24 HISTORY — DX: Type 2 diabetes mellitus with unspecified complications: E11.8

## 2019-03-24 HISTORY — DX: Essential (primary) hypertension: I10

## 2019-03-24 HISTORY — DX: Hyperlipidemia, unspecified: E78.5

## 2019-03-24 NOTE — Progress Notes (Signed)
Virtual Visit via Telephone Note: Patient unable to use video assisted device.  This visit type was conducted due to national recommendations for restrictions regarding the COVID-19 Pandemic (e.g. social distancing).  This format is felt to be most appropriate for this patient at this time.  All issues noted in this document were discussed and addressed.  No physical exam was performed.  The patient has consented to conduct a Telehealth visit and understands insurance will be billed.   I connected  on 03/24/19 at  by TELEPHONE and verified that I am speaking with the correct person using two identifiers.   I discussed the limitations of evaluation and management by telemedicine and the availability of in person appointments. The patient expressed understanding and agreed to proceed.   I have discussed with patient regarding the safety during COVID Pandemic and steps and precautions to be taken including social distancing, frequent hand wash and use of detergent soap, gels with the patient. I asked the patient to avoid touching mouth, nose, eyes, ears with the hands. I encouraged regular walking around the neighborhood and exercise and regular diet, as long as social distancing can be maintained.  Primary Physician/Referring:  Janie Morning, DO  Patient ID: Brandi Peters, female    DOB: 10-13-1951, 68 y.o.   MRN: [id number]  Chief Complaint  Patient presents with   Hypertension   Carotid    stenosis   Follow-up    HPI: Brandi Peters  is a 68 y.o. female  with uncontrolled diabetes mellitus without complications, fibromyalgia, morbid obesity, mild to moderate aortic stenosis and mild aortic regurgitation, asymptomatic right carotid artery stenosis.  Due to bradycardia I have switched her from metoprolol to labetalol, this is her 69-monthoffice visit.  She states that she is doing well and since making changes to her medications, blood pressure has been well controlled, she has not had  any further leg edema as well.  Denies any symptoms of TIA or claudication, no visual disturbances.  No significant change in her weight recently.  Past Medical History:  Diagnosis Date   Diabetes mellitus without complication ([place]    HTN (hypertension) 03/24/2019   Hyperlipemia 03/24/2019   Hyperlipidemia    Hypertension    Type 2 diabetes mellitus with complication, without long-term current use of insulin ([place] 03/24/2019    Past Surgical History:  Procedure Laterality Date   BREAST BIOPSY Left 06/23/2012   BREAST EXCISIONAL BIOPSY Left a long time ago per pt   no visible scar    Social History   Socioeconomic History   Marital status: Married    Spouse name: Not on file   Number of children: 3   Years of education: Not on file   Highest education level: Not on file  Occupational History   Not on file  Social Needs   Financial resource strain: Not on file   Food insecurity:    Worry: Not on file    Inability: Not on file   Transportation needs:    Medical: Not on file    Non-medical: Not on file  Tobacco Use   Smoking status: Former Smoker    Packs/day: 1.00    Years: 25.00    Pack years: 25.00    Types: Cigarettes    Last attempt to quit: 2015    Years since quitting: 5.4   Smokeless tobacco: Never Used  Substance and Sexual Activity   Alcohol use: Yes    Comment: on holidays  Drug use: Never   Sexual activity: Not on file  Lifestyle   Physical activity:    Days per week: Not on file    Minutes per session: Not on file   Stress: Not on file  Relationships   Social connections:    Talks on phone: Not on file    Gets together: Not on file    Attends religious service: Not on file    Active member of club or organization: Not on file    Attends meetings of clubs or organizations: Not on file    Relationship status: Not on file   Intimate partner violence:    Fear of current or ex partner: Not on file    Emotionally abused: Not  on file    Physically abused: Not on file    Forced sexual activity: Not on file  Other Topics Concern   Not on file  Social History Narrative   Not on file   Review of Systems  Constitution: Negative for chills, decreased appetite, malaise/fatigue and weight gain.  Cardiovascular: Positive for dyspnea on exertion (stable). Negative for leg swelling and syncope.  Endocrine: Negative for cold intolerance.  Hematologic/Lymphatic: Does not bruise/bleed easily.  Musculoskeletal: Positive for back pain and joint pain. Negative for joint swelling.  Gastrointestinal: Negative for abdominal pain, anorexia, change in bowel habit, hematochezia and melena.  Neurological: Negative for headaches and light-headedness.  Psychiatric/Behavioral: Negative for depression and substance abuse.  All other systems reviewed and are negative.     Objective  Height '5\' 3"'$  (1.6 m), weight 240 lb (108.9 kg). Body mass index is 42.51 kg/m.   Physical exam not performed or limited due to virtual visit.   Please see exam details from prior visit is as below.  Physical Exam  Constitutional: She appears well-developed and well-nourished. No distress.  HENT:  Head: Atraumatic.  Eyes: Conjunctivae are normal.  Neck: Neck supple. No JVD present. No thyromegaly present.  Cardiovascular: Normal rate, regular rhythm, S1 normal and S2 normal. Exam reveals no gallop.  Murmur heard.  Systolic murmur is present with a grade of 3/6. High-pitched blowing decrescendo early diastolic murmur is present with a grade of 2/6 at the upper right sternal border radiating to the apex. Femoral and popliteal pulse difficult to feel. Normal pedal pulse. Bilateral carotid bruit present.  Trace edema.  Pulmonary/Chest: Effort normal and breath sounds normal.  Abdominal: Soft. Bowel sounds are normal.  Obese and pannus present.   Musculoskeletal: Normal range of motion.        General: No edema.  Neurological: She is alert.  Skin:  Skin is warm and dry.  Psychiatric: She has a normal mood and affect.   Radiology: No results found.  Laboratory examination:   06/29/2018: Glucose 220, creatinine 1.0, EGFR 58/67, potassium 4.7, CMP otherwise normal.  Cholesterol 176, triglycerides 85, HDL 50, hemoglobin A1c 7.6%.  PRN Meds:. There are no discontinued medications. Current Meds  Medication Sig   aspirin 81 MG chewable tablet Chew 81 mg by mouth daily.   glipiZIDE (GLUCOTROL) 5 MG tablet Take 5 mg by mouth. 2 two times daily   hydrochlorothiazide (HYDRODIURIL) 25 MG tablet Take 25 mg by mouth daily.   labetalol (NORMODYNE) 100 MG tablet Take 1 tablet by mouth twice daily   labetalol (NORMODYNE) 200 MG tablet Take 200 mg by mouth 2 (two) times daily.   metFORMIN (GLUCOPHAGE) 500 MG tablet Take by mouth 2 (two) times daily with a meal.   PARoxetine (  PAXIL) 20 MG tablet Take 20 mg by mouth daily.   pioglitazone (ACTOS) 15 MG tablet Take 15 mg by mouth daily.   pravastatin (PRAVACHOL) 40 MG tablet Take 40 mg by mouth daily.    Cardiac Studies:   Carotid artery duplex Aug 23, 2018: Stenosis in the right internal carotid artery (50-69%). Stenosis in the right external carotid artery (<50%). Antegrade right vertebral artery flow. Antegrade left vertebral artery flow. Compared to the study done on 07/07/2017, right ICA velocity has increased from less than 50%. Follow up in six months is appropriate if clinically indicated.  Echocardiogram 09/10/2018: Left ventricle cavity is normal in size. Mild to moderate concentric hypertrophy of the left ventricle. Normal global wall motion. Indeterminate diastolic function due to lack of data. Calculated EF 56%. Left atrial cavity is mildly dilated LA measures at 3.9 cm. Mild aortic regurgitation. Moderate aortic valve leaflet calcification. Moderately restricted aortic valve leaflets. Mild to at most moderate aortic valve stenosis. Aortic valve peak pressure gradient of  42  and  mean gradient of 20 mmHg, calculated aortic valve area 1.38 cm. Compared to the study done on 05/27/2017, no significant change.  Lexiscan Myoview stress test 02/24/2014: 1. Resting EKG NSR. Stress EKG was non diagnostic for ischemia. No ST-T changes of ischemia noted with pharmacologic stress testing.  Stress symptoms included nausea/vomiting and SOB. Stress terminated due to completion of protocol. 2. Perfusion imaging study demonstrated mild soft tissue attenuation in the anterior and inferior wall. There was no e/o ischemia or scar. The left ventricular systolic function was normal at 77%. This is a low risk study.  Assessment   Asymptomatic stenosis of right carotid artery  Essential hypertension  Pure hypercholesterolemia  Type 2 diabetes mellitus with complication, without long-term current use of insulin ([place])  EKG 08/23/2018: Normal sinus rhythm at rate of 60 bpm, normal axis.  Right bundle branch block.  No evidence of ischemia. No significant change from EKG 06/03/2017.  Recommendations:   Patient continues to have uncontrolled diabetes mellitus.  However fortunately she has not had complications from the same.  No significant change in her weight.  She had carotid artery duplex scheduled for February 2020 for surveillance, due to COVID-19, it is not done.  I will schedule her for the same test and I will like to see him back in the office after that.  Patient has not recorded a blood pressure, states that previously when she checked it was told to be normal.  I do not have a recent labs, lipids have been well controlled on her prior labs in September 2019.  I will see her back in 2 to 3 months following carotid artery duplex.  She is presently tolerating pravastatin and she is on 81 mg of aspirin, continue the same.  Adrian Prows, MD, [facility] 03/25/2019, 10:47 AM [facility]. [place] Pager: [phone] Office: [phone] If no answer Cell [phone]

## 2019-03-25 ENCOUNTER — Telehealth: Payer: Self-pay

## 2019-03-25 NOTE — Telephone Encounter (Signed)
Pt called says you left a msg and she didn't get the full msg if you can please give her a call back

## 2019-03-26 NOTE — Telephone Encounter (Signed)
She needs Carotid duplex and OV later. I have sent message to the front staff

## 2019-03-29 ENCOUNTER — Other Ambulatory Visit: Payer: Self-pay | Admitting: Internal Medicine

## 2019-03-29 ENCOUNTER — Other Ambulatory Visit: Payer: Self-pay | Admitting: Family Medicine

## 2019-03-29 DIAGNOSIS — Z1231 Encounter for screening mammogram for malignant neoplasm of breast: Secondary | ICD-10-CM

## 2019-04-01 DIAGNOSIS — I10 Essential (primary) hypertension: Secondary | ICD-10-CM | POA: Diagnosis not present

## 2019-04-01 DIAGNOSIS — E119 Type 2 diabetes mellitus without complications: Secondary | ICD-10-CM | POA: Diagnosis not present

## 2019-04-01 DIAGNOSIS — E7800 Pure hypercholesterolemia, unspecified: Secondary | ICD-10-CM | POA: Diagnosis not present

## 2019-04-12 DIAGNOSIS — R21 Rash and other nonspecific skin eruption: Secondary | ICD-10-CM | POA: Diagnosis not present

## 2019-04-12 DIAGNOSIS — Z7189 Other specified counseling: Secondary | ICD-10-CM | POA: Diagnosis not present

## 2019-04-12 DIAGNOSIS — N183 Chronic kidney disease, stage 3 (moderate): Secondary | ICD-10-CM | POA: Diagnosis not present

## 2019-04-12 DIAGNOSIS — I350 Nonrheumatic aortic (valve) stenosis: Secondary | ICD-10-CM | POA: Diagnosis not present

## 2019-04-12 DIAGNOSIS — M5136 Other intervertebral disc degeneration, lumbar region: Secondary | ICD-10-CM | POA: Diagnosis not present

## 2019-04-12 DIAGNOSIS — E119 Type 2 diabetes mellitus without complications: Secondary | ICD-10-CM | POA: Diagnosis not present

## 2019-04-12 DIAGNOSIS — I6529 Occlusion and stenosis of unspecified carotid artery: Secondary | ICD-10-CM | POA: Diagnosis not present

## 2019-04-12 DIAGNOSIS — R252 Cramp and spasm: Secondary | ICD-10-CM | POA: Diagnosis not present

## 2019-04-12 DIAGNOSIS — I10 Essential (primary) hypertension: Secondary | ICD-10-CM | POA: Diagnosis not present

## 2019-04-12 DIAGNOSIS — R011 Cardiac murmur, unspecified: Secondary | ICD-10-CM | POA: Diagnosis not present

## 2019-05-16 ENCOUNTER — Ambulatory Visit
Admission: RE | Admit: 2019-05-16 | Discharge: 2019-05-16 | Disposition: A | Discharge status: Home / Self Care | Payer: Medicare Other | Source: Ambulatory Visit | Attending: Family Medicine | Admitting: Family Medicine

## 2019-05-16 ENCOUNTER — Other Ambulatory Visit: Payer: Self-pay

## 2019-05-16 DIAGNOSIS — Z1231 Encounter for screening mammogram for malignant neoplasm of breast: Secondary | ICD-10-CM | POA: Diagnosis not present

## 2019-05-19 DIAGNOSIS — E119 Type 2 diabetes mellitus without complications: Secondary | ICD-10-CM | POA: Diagnosis not present

## 2019-05-19 DIAGNOSIS — I10 Essential (primary) hypertension: Secondary | ICD-10-CM | POA: Diagnosis not present

## 2019-05-19 DIAGNOSIS — I350 Nonrheumatic aortic (valve) stenosis: Secondary | ICD-10-CM | POA: Diagnosis not present

## 2019-05-19 DIAGNOSIS — E7800 Pure hypercholesterolemia, unspecified: Secondary | ICD-10-CM | POA: Diagnosis not present

## 2019-05-19 DIAGNOSIS — M5136 Other intervertebral disc degeneration, lumbar region: Secondary | ICD-10-CM | POA: Diagnosis not present

## 2019-05-19 DIAGNOSIS — R252 Cramp and spasm: Secondary | ICD-10-CM | POA: Diagnosis not present

## 2019-05-19 DIAGNOSIS — R946 Abnormal results of thyroid function studies: Secondary | ICD-10-CM | POA: Diagnosis not present

## 2019-05-19 DIAGNOSIS — Z7189 Other specified counseling: Secondary | ICD-10-CM | POA: Diagnosis not present

## 2019-05-25 ENCOUNTER — Ambulatory Visit: Payer: Medicare Other | Admitting: Registered"

## 2019-06-22 ENCOUNTER — Other Ambulatory Visit: Payer: Self-pay

## 2019-06-22 ENCOUNTER — Encounter: Payer: Medicare Other | Attending: Family Medicine | Admitting: *Deleted

## 2019-06-22 DIAGNOSIS — E118 Type 2 diabetes mellitus with unspecified complications: Secondary | ICD-10-CM | POA: Insufficient documentation

## 2019-06-22 NOTE — Patient Instructions (Signed)
Plan:  Aim for 2-3 Carb Choices per meal (30-45 grams)  Aim for 0-2 Carbs per snack if hungry  Include protein in moderation with your meals and snacks Consider reading food labels for Total Carbohydrate of foods Consider  increasing your activity level by doing some Arm Chair exercises for 10-15 minutes daily as tolerated Continue taking medication as directed by MD

## 2019-06-28 NOTE — Progress Notes (Signed)
Diabetes Self-Management Education  Visit Type: First/Initial  Appt. Start Time: 0815 Appt. End Time: 0935  06/28/2019  Brandi Peters, identified by name and date of birth, is a 68 y.o. female with a diagnosis of Diabetes: Type 2. Patient states she rarely checks her BG because it hurts her fingers. She is most interested in losing weight right now. She states her activity level is limited due to chronic back pain and fibromyalgia  ASSESSMENT  There were no vitals taken for this visit. There is no height or weight on file to calculate BMI.  Diabetes Self-Management Education - 06/22/19 0831      Visit Information   Visit Type  First/Initial      Initial Visit   Diabetes Type  Type 2    Are you currently following a meal plan?  No    Are you taking your medications as prescribed?  Yes    Date Diagnosed  5 years      Health Coping   How would you rate your overall health?  Fair      Psychosocial Assessment   Patient Belief/Attitude about Diabetes  Denial    Other persons present  Patient    Patient Concerns  Nutrition/Meal planning;Monitoring;Medication;Glycemic Control;Weight Control    Special Needs  Simplified materials    Preferred Learning Style  Auditory;Visual;Hands on    Learning Readiness  Contemplating    How often do you need to have someone help you when you read instructions, pamphlets, or other written materials from your doctor or pharmacy?  1 - Never    What is the last grade level you completed in school?  2 years college      Pre-Education Assessment   Patient understands the diabetes disease and treatment process.  Needs Instruction    Patient understands incorporating nutritional management into lifestyle.  Needs Instruction    Patient undertands incorporating physical activity into lifestyle.  Needs Instruction    Patient understands using medications safely.  Needs Instruction    Patient understands monitoring blood glucose, interpreting and using  results  Needs Instruction    Patient understands prevention, detection, and treatment of acute complications.  Needs Instruction    Patient understands prevention, detection, and treatment of chronic complications.  Needs Instruction    Patient understands how to develop strategies to address psychosocial issues.  Needs Instruction    Patient understands how to develop strategies to promote health/change behavior.  Needs Instruction      Complications   Last HgB A1C per patient/outside source  7.5 %    How often do you check your blood sugar?  3-4 times / week    Have you had a dilated eye exam in the past 12 months?  No    Have you had a dental exam in the past 12 months?  No   has dentures     Dietary Intake   Breakfast  skips often    Lunch  varies daily left overs or whatever is in refrigerator or goes out to get fast food    Snack (afternoon)  chips, PNB sandwich, fresh fruit,    Dinner  cooks for herself and husband: meat, starch, vegetables, occasionally bread and salad OR go out to eat    Snack (evening)  same as afternoon, or ice cream in bowl or cone    Beverage(s)  regular soda, crystal light, unsweet tea with Splenda, a little water      Exercise   Exercise Type  ADL's   back pain and fibromyalgia limits activity   How many days per week to you exercise?  0    How many minutes per day do you exercise?  0    Total minutes per week of exercise  0      Patient Education   Previous Diabetes Education  No    Disease state   Factors that contribute to the development of diabetes    Nutrition management   Role of diet in the treatment of diabetes and the relationship between the three main macronutrients and blood glucose level;Carbohydrate counting    Physical activity and exercise   Helped patient identify appropriate exercises in relation to his/her diabetes, diabetes complications and other health issue.    Medications  Reviewed patients medication for diabetes, action,  purpose, timing of dose and side effects.    Monitoring  Identified appropriate SMBG and/or A1C goals.;Other (comment)   informed her of Libre CGM as option for BG monitoring   Chronic complications  Relationship between chronic complications and blood glucose control    Psychosocial adjustment  Role of stress on diabetes      Individualized Goals (developed by patient)   Nutrition  Follow meal plan discussed    Physical Activity  15 minutes per day    Medications  take my medication as prescribed    Monitoring   test my blood glucose as discussed      Post-Education Assessment   Patient understands incorporating nutritional management into lifestyle.  Demonstrates understanding / competency    Patient undertands incorporating physical activity into lifestyle.  Demonstrates understanding / competency    Patient understands using medications safely.  Demonstrates understanding / competency    Patient understands monitoring blood glucose, interpreting and using results  Needs Review      Outcomes   Expected Outcomes  Demonstrated interest in learning. Expect positive outcomes    Future DMSE  PRN    Program Status  Not Completed       Individualized Plan for Diabetes Self-Management Training:   Learning Objective:  Patient will have a greater understanding of diabetes self-management. Patient education plan is to attend individual and/or group sessions per assessed needs and concerns.   Plan:   Patient Instructions  Plan:  Aim for 2-3 Carb Choices per meal (30-45 grams)  Aim for 0-2 Carbs per snack if hungry  Include protein in moderation with your meals and snacks Consider reading food labels for Total Carbohydrate of foods Consider  increasing your activity level by doing some Arm Chair exercises for 10-15 minutes daily as tolerated Continue taking medication as directed by MD  Expected Outcomes:  Demonstrated interest in learning. Expect positive outcomes  Education  material provided: A1C conversion sheet, Meal plan card and Carbohydrate counting sheet, Arm Chair Exercise handout  If problems or questions, patient to contact team via:  Phone  Future DSME appointment: PRN

## 2019-07-19 DIAGNOSIS — I872 Venous insufficiency (chronic) (peripheral): Secondary | ICD-10-CM | POA: Diagnosis not present

## 2019-08-12 DIAGNOSIS — E7800 Pure hypercholesterolemia, unspecified: Secondary | ICD-10-CM | POA: Diagnosis not present

## 2019-08-12 DIAGNOSIS — R946 Abnormal results of thyroid function studies: Secondary | ICD-10-CM | POA: Diagnosis not present

## 2019-08-12 DIAGNOSIS — E119 Type 2 diabetes mellitus without complications: Secondary | ICD-10-CM | POA: Diagnosis not present

## 2019-08-12 DIAGNOSIS — I10 Essential (primary) hypertension: Secondary | ICD-10-CM | POA: Diagnosis not present

## 2019-08-15 ENCOUNTER — Ambulatory Visit (INDEPENDENT_AMBULATORY_CARE_PROVIDER_SITE_OTHER): Payer: Medicare Other

## 2019-08-15 ENCOUNTER — Other Ambulatory Visit: Payer: Self-pay

## 2019-08-15 DIAGNOSIS — R0989 Other specified symptoms and signs involving the circulatory and respiratory systems: Secondary | ICD-10-CM | POA: Diagnosis not present

## 2019-08-16 ENCOUNTER — Other Ambulatory Visit: Payer: Self-pay | Admitting: Cardiology

## 2019-08-16 DIAGNOSIS — I6521 Occlusion and stenosis of right carotid artery: Secondary | ICD-10-CM

## 2019-08-18 DIAGNOSIS — I10 Essential (primary) hypertension: Secondary | ICD-10-CM | POA: Diagnosis not present

## 2019-08-18 DIAGNOSIS — Z23 Encounter for immunization: Secondary | ICD-10-CM | POA: Diagnosis not present

## 2019-08-18 DIAGNOSIS — N183 Chronic kidney disease, stage 3 unspecified: Secondary | ICD-10-CM | POA: Diagnosis not present

## 2019-08-18 DIAGNOSIS — I350 Nonrheumatic aortic (valve) stenosis: Secondary | ICD-10-CM | POA: Diagnosis not present

## 2019-08-18 DIAGNOSIS — I6529 Occlusion and stenosis of unspecified carotid artery: Secondary | ICD-10-CM | POA: Diagnosis not present

## 2019-08-18 DIAGNOSIS — E1122 Type 2 diabetes mellitus with diabetic chronic kidney disease: Secondary | ICD-10-CM | POA: Diagnosis not present

## 2019-08-18 DIAGNOSIS — M5136 Other intervertebral disc degeneration, lumbar region: Secondary | ICD-10-CM | POA: Diagnosis not present

## 2019-08-18 NOTE — Progress Notes (Signed)
Spoke with patient gave her results. She verbalized understanding

## 2019-09-27 ENCOUNTER — Encounter: Payer: Self-pay | Admitting: Cardiology

## 2019-09-27 ENCOUNTER — Ambulatory Visit (INDEPENDENT_AMBULATORY_CARE_PROVIDER_SITE_OTHER): Payer: Medicare Other | Admitting: Cardiology

## 2019-09-27 ENCOUNTER — Other Ambulatory Visit: Payer: Self-pay

## 2019-09-27 VITALS — BP 130/90 | HR 89 | Temp 96.2°F | Resp 12 | Ht 63.0 in | Wt 241.0 lb

## 2019-09-27 DIAGNOSIS — I10 Essential (primary) hypertension: Secondary | ICD-10-CM

## 2019-09-27 DIAGNOSIS — I6521 Occlusion and stenosis of right carotid artery: Secondary | ICD-10-CM | POA: Diagnosis not present

## 2019-09-27 DIAGNOSIS — M109 Gout, unspecified: Secondary | ICD-10-CM | POA: Diagnosis not present

## 2019-09-27 MED ORDER — COLCHICINE 0.6 MG PO TABS: 0.6000 mg | ORAL_TABLET | ORAL | 0 refills | Status: DC

## 2019-09-27 NOTE — Progress Notes (Signed)
Primary Physician/Referring:  Janie Morning, DO  Patient ID: Brandi Peters, female    DOB: 1951/06/24, 68 y.o.   MRN: [id number]  Chief Complaint  Patient presents with  . Hypertension  . Shortness of Breath  . Leg Swelling  . Follow-up    3 month    HPI: Brandi Peters  is a 68 y.o. female  with uncontrolled diabetes mellitus without complications, fibromyalgia, morbid obesity, mild to moderate aortic stenosis and mild aortic regurgitation, asymptomatic right carotid artery stenosis, Bradycardia presents here for 6 month office visit for hypertension and carotid stenosis management.  She was switched from metoprolol to labetalol due to bradycardia which is tolerating.  Blood pressure per patient has been fairly stable.  She started having severe pain in the right ankle 2 weeks ago and has been walking with a cane with great difficulty.  Dyspnea has remained stable, no PND or orthopnea.  Denies chest pain or palpitations.  Past Medical History:  Diagnosis Date  . Diabetes mellitus without complication ([place])   . HTN (hypertension) 03/24/2019  . Hyperlipemia 03/24/2019  . Hyperlipidemia   . Hypertension   . Type 2 diabetes mellitus with complication, without long-term current use of insulin ([place]) 03/24/2019    Past Surgical History:  Procedure Laterality Date  . BREAST BIOPSY Left 06/23/2012  . BREAST EXCISIONAL BIOPSY Left a long time ago per pt   no visible scar    Social History   Socioeconomic History  . Marital status: Married    Spouse name: Not on file  . Number of children: 3  . Years of education: Not on file  . Highest education level: Not on file  Occupational History  . Not on file  Social Needs  . Financial resource strain: Not on file  . Food insecurity    Worry: Not on file    Inability: Not on file  . Transportation needs    Medical: Not on file    Non-medical: Not on file  Tobacco Use  . Smoking status: Former Smoker    Packs/day: 1.00    Years:  25.00    Pack years: 25.00    Types: Cigarettes    Quit date: 2015    Years since quitting: 5.9  . Smokeless tobacco: Never Used  Substance and Sexual Activity  . Alcohol use: Yes    Comment: on holidays   . Drug use: Never  . Sexual activity: Not on file  Lifestyle  . Physical activity    Days per week: Not on file    Minutes per session: Not on file  . Stress: Not on file  Relationships  . Social Herbalist on phone: Not on file    Gets together: Not on file    Attends religious service: Not on file    Active member of club or organization: Not on file    Attends meetings of clubs or organizations: Not on file    Relationship status: Not on file  . Intimate partner violence    Fear of current or ex partner: Not on file    Emotionally abused: Not on file    Physically abused: Not on file    Forced sexual activity: Not on file  Other Topics Concern  . Not on file  Social History Narrative  . Not on file   Review of Systems  Constitution: Negative for chills, decreased appetite, malaise/fatigue and weight gain.  Cardiovascular: Positive for dyspnea on exertion (  stable) and leg swelling (right ankle). Negative for syncope.  Endocrine: Negative for cold intolerance.  Hematologic/Lymphatic: Does not bruise/bleed easily.  Musculoskeletal: Positive for arthritis (right ankle), back pain and joint pain. Negative for joint swelling.  Gastrointestinal: Negative for abdominal pain, anorexia, change in bowel habit, hematochezia and melena.  Neurological: Negative for headaches and light-headedness.  Psychiatric/Behavioral: Negative for depression and substance abuse.  All other systems reviewed and are negative.  Objective   Vitals with BMI 09/27/2019 03/24/2019  Height '5\' 3"'$  '5\' 3"'$   Weight 241 lbs 240 lbs  BMI 29.5 18.84  Systolic 166 -  Diastolic 90 -  Pulse 89 -    Blood pressure 130/90, pulse 89, temperature (!) 96.2 F (35.7 C), resp. rate 12, height '5\' 3"'$  (1.6  m), weight 241 lb (109.3 kg), SpO2 99 %.   Physical Exam  Constitutional: She appears well-developed and well-nourished. No distress.  HENT:  Head: Atraumatic.  Eyes: Conjunctivae are normal.  Neck: Neck supple. No JVD present. No thyromegaly present.  Cardiovascular: Normal rate, regular rhythm, S1 normal, S2 normal, intact distal pulses and normal pulses. Exam reveals no gallop.  Murmur heard.  Systolic murmur is present with a grade of 3/6. High-pitched blowing decrescendo early diastolic murmur is present with a grade of 2/6 at the upper right sternal border radiating to the apex. Pulses:      Carotid pulses are on the right side with bruit and on the left side with bruit. Femoral and popliteal pulse difficult to feel. Normal Normal pedal pulse. Bilateral carotid bruit present.  Right ankle warm and joint mildly red and tender with 2 plus pitting edema. No calf tenderness or warmth.   Pulmonary/Chest: Effort normal and breath sounds normal.  Abdominal: Soft. Bowel sounds are normal.  Obese and pannus present.   Musculoskeletal: Normal range of motion.        General: No edema.  Neurological: She is alert.  Skin: Skin is warm and dry.  Psychiatric: She has a normal mood and affect.   Radiology: No results found.  Laboratory examination:   06/29/2018: Glucose 220, creatinine 1.0, EGFR 58/67, potassium 4.7, CMP otherwise normal.  Cholesterol 176, triglycerides 85, HDL 50, hemoglobin A1c 7.6%.  PRN Meds:. There are no discontinued medications. Current Meds  Medication Sig  . aspirin 81 MG chewable tablet Chew 81 mg by mouth daily.  Marland Kitchen glipiZIDE (GLUCOTROL) 5 MG tablet Take 5 mg by mouth. 2 two times daily  . hydrochlorothiazide (HYDRODIURIL) 25 MG tablet Take 25 mg by mouth daily.  Marland Kitchen labetalol (NORMODYNE) 100 MG tablet Take 1 tablet by mouth twice daily  . labetalol (NORMODYNE) 200 MG tablet Take 200 mg by mouth 2 (two) times daily.  . metFORMIN (GLUCOPHAGE) 500 MG tablet Take  by mouth 2 (two) times daily with a meal.  . PARoxetine (PAXIL) 20 MG tablet Take 20 mg by mouth daily.  . pravastatin (PRAVACHOL) 40 MG tablet Take 40 mg by mouth daily.    Cardiac Studies:   Echocardiogram 09/10/2018: Left ventricle cavity is normal in size. Mild to moderate concentric hypertrophy of the left ventricle. Normal global wall motion. Indeterminate diastolic function due to lack of data. Calculated EF 56%. Left atrial cavity is mildly dilated LA measures at 3.9 cm. Mild aortic regurgitation. Moderate aortic valve leaflet calcification. Moderately restricted aortic valve leaflets. Mild to at most moderate aortic valve stenosis. Aortic valve peak pressure gradient of  42  and mean gradient of 20 mmHg, calculated aortic valve area  1.38 cm. Compared to the study done on 05/27/2017, no significant change.  Lexiscan Myoview stress test 02/24/2014: 1. Resting EKG NSR. Stress EKG was non diagnostic for ischemia. No ST-T changes of ischemia noted with pharmacologic stress testing.  Stress symptoms included nausea/vomiting and SOB. Stress terminated due to completion of protocol. 2. Perfusion imaging study demonstrated mild soft tissue attenuation in the anterior and inferior wall. There was no e/o ischemia or scar. The left ventricular systolic function was normal at 77%. This is a low risk study.  Carotid artery duplex  08/15/2019: Stenosis in the right internal carotid artery (50-69%). Stenosis in the right external carotid artery (<50%). Minimal plaque left ICA. Antegrade right vertebral artery flow. Antegrade left vertebral artery flow. Compared to the study done on 08/09/2018, no significant change. Follow up in six months is appropriate if clinically indicated.  Assessment     ICD-10-CM   1. Essential hypertension  I10 EKG 12-Lead  2. Asymptomatic stenosis of right carotid artery  I65.21     EKG 812/05/2019: Normal sinus rhythm at rate of 78 bpm, normal axis, right bundle  branch block.  No evidence of ischemia.  No significant change from 08/23/2018.  Recommendations:   Brandi Peters  is a 68 y.o. female  with uncontrolled diabetes mellitus without complications, fibromyalgia, morbid obesity, mild to moderate aortic stenosis and mild aortic regurgitation, asymptomatic right carotid artery stenosis, Bradycardia presents here for 6 month office visit for hypertension and carotid stenosis management.  Patient is aware not to take pravastatin for the next 3 days after taking colchicine to reduce rhabdomyolysis.  She'll also take Aleve 200 mg b.i.d. for the next 3 days.  She'll contact her PCP or she will contact me if leg pain is not improved.  There is no clinical evidence of DVT.  Blood pressure is elevated today but however due to severe pain I did not make any changes to her medications.  I'd like to see her back in 6 weeks for follow-up of hypertension.  She is also scheduled for complete blood work to be done by her PCP in the next few weeks, patient will bring me copies of the same.  With regard to carotid artery duplex, right carotid stenosis appears to remain stable.  We'll continue to do surveillance.  With regard to diabetes mellitus, morbid obesity, I have again discussed with her regarding making lifestyle changes.  Adrian Prows, MD, [facility] 09/27/2019, 3:31 PM [facility] Pager: [phone] Office: [phone] If no answer Cell [phone]  NL:GXQJ Collins, DO

## 2019-10-10 ENCOUNTER — Other Ambulatory Visit: Payer: Self-pay | Admitting: Cardiology

## 2019-10-10 DIAGNOSIS — M109 Gout, unspecified: Secondary | ICD-10-CM

## 2019-11-05 ENCOUNTER — Other Ambulatory Visit: Payer: Self-pay | Admitting: Cardiology

## 2019-11-05 DIAGNOSIS — M109 Gout, unspecified: Secondary | ICD-10-CM

## 2019-11-09 DIAGNOSIS — M79673 Pain in unspecified foot: Secondary | ICD-10-CM | POA: Diagnosis not present

## 2019-11-09 DIAGNOSIS — I10 Essential (primary) hypertension: Secondary | ICD-10-CM | POA: Diagnosis not present

## 2019-11-09 DIAGNOSIS — E1122 Type 2 diabetes mellitus with diabetic chronic kidney disease: Secondary | ICD-10-CM | POA: Diagnosis not present

## 2019-11-09 DIAGNOSIS — N183 Chronic kidney disease, stage 3 unspecified: Secondary | ICD-10-CM | POA: Diagnosis not present

## 2019-11-11 ENCOUNTER — Encounter: Payer: Self-pay | Admitting: Cardiology

## 2019-11-11 ENCOUNTER — Other Ambulatory Visit: Payer: Self-pay

## 2019-11-11 ENCOUNTER — Ambulatory Visit (INDEPENDENT_AMBULATORY_CARE_PROVIDER_SITE_OTHER): Payer: Medicare Other | Admitting: Cardiology

## 2019-11-11 VITALS — BP 180/69 | HR 75 | Temp 93.4°F | Ht 63.0 in | Wt 237.0 lb

## 2019-11-11 DIAGNOSIS — I10 Essential (primary) hypertension: Secondary | ICD-10-CM

## 2019-11-11 DIAGNOSIS — E7800 Pure hypercholesterolemia, unspecified: Secondary | ICD-10-CM

## 2019-11-11 DIAGNOSIS — I351 Nonrheumatic aortic (valve) insufficiency: Secondary | ICD-10-CM | POA: Diagnosis not present

## 2019-11-11 MED ORDER — AMLODIPINE BESYLATE 10 MG PO TABS: 10.0000 mg | ORAL_TABLET | Freq: Every day | ORAL | 2 refills | Status: DC

## 2019-11-11 NOTE — Progress Notes (Signed)
Primary Physician/Referring:  Janie Morning, DO  Patient ID: Brandi Peters, female    DOB: April 26, 1951, 69 y.o.   MRN: [id number]  Chief Complaint  Patient presents with  . Hypertension  . Follow-up    HPI: Brandi Peters  is a 69 y.o. female  with uncontrolled diabetes mellitus without complications, fibromyalgia, morbid obesity, mild to moderate aortic stenosis and mild aortic regurgitation, asymptomatic right carotid artery stenosis, Bradycardia presents here for 6 month office visit for hypertension and carotid stenosis management.  I had seen her 4 weeks ago with acute gouty arthritis, prescribed her colchicine she now presents for follow-up, blood pressure was markedly elevated.  She is presently on labetalol due to bradycardia which is tolerating.  Dyspnea has remained stable, no PND or orthopnea.  Denies chest pain or palpitations. Symptoms of gout and gouty arthritis is essentially resolved being on colchicine.  She has a follow-up appointment with the PCP.  Past Medical History:  Diagnosis Date  . Diabetes mellitus without complication ([place])   . HTN (hypertension) 03/24/2019  . Hyperlipemia 03/24/2019  . Hyperlipidemia   . Hypertension   . Type 2 diabetes mellitus with complication, without long-term current use of insulin ([place]) 03/24/2019    Past Surgical History:  Procedure Laterality Date  . BREAST BIOPSY Left 06/23/2012  . BREAST EXCISIONAL BIOPSY Left a long time ago per pt   no visible scar    Social History   Socioeconomic History  . Marital status: Married    Spouse name: Not on file  . Number of children: 3  . Years of education: Not on file  . Highest education level: Not on file  Occupational History  . Not on file  Tobacco Use  . Smoking status: Former Smoker    Packs/day: 1.00    Years: 25.00    Pack years: 25.00    Types: Cigarettes    Quit date: 2015    Years since quitting: 6.0  . Smokeless tobacco: Never Used  Substance and Sexual Activity    . Alcohol use: Yes    Comment: on holidays   . Drug use: Never  . Sexual activity: Not on file  Other Topics Concern  . Not on file  Social History Narrative  . Not on file   Social Determinants of Health   Financial Resource Strain:   . Difficulty of Paying Living Expenses: Not on file  Food Insecurity:   . Worried About Charity fundraiser in the Last Year: Not on file  . Ran Out of Food in the Last Year: Not on file  Transportation Needs:   . Lack of Transportation (Medical): Not on file  . Lack of Transportation (Non-Medical): Not on file  Physical Activity:   . Days of Exercise per Week: Not on file  . Minutes of Exercise per Session: Not on file  Stress:   . Feeling of Stress : Not on file  Social Connections:   . Frequency of Communication with Friends and Family: Not on file  . Frequency of Social Gatherings with Friends and Family: Not on file  . Attends Religious Services: Not on file  . Active Member of Clubs or Organizations: Not on file  . Attends Archivist Meetings: Not on file  . Marital Status: Not on file  Intimate Partner Violence:   . Fear of Current or Ex-Partner: Not on file  . Emotionally Abused: Not on file  . Physically Abused: Not on file  .  Sexually Abused: Not on file   Review of Systems  Constitution: Negative for chills, decreased appetite, malaise/fatigue and weight gain.  Cardiovascular: Positive for dyspnea on exertion (stable). Negative for leg swelling and syncope.  Endocrine: Negative for cold intolerance.  Hematologic/Lymphatic: Does not bruise/bleed easily.  Musculoskeletal: Positive for back pain and joint pain. Negative for arthritis and joint swelling.  Gastrointestinal: Negative for abdominal pain, anorexia, change in bowel habit, hematochezia and melena.  Neurological: Negative for headaches and light-headedness.  Psychiatric/Behavioral: Negative for depression and substance abuse.  All other systems reviewed and are  negative.  Objective   Vitals with BMI 11/11/2019 09/27/2019 03/24/2019  Height '5\' 3"'$  '5\' 3"'$  '5\' 3"'$   Weight 237 lbs 241 lbs 240 lbs  BMI 41.99 09.2 33.00  Systolic 762 263 -  Diastolic 69 90 -  Pulse 75 89 -    Blood pressure (!) 180/69, pulse 75, temperature (!) 93.4 F (34.1 C), height '5\' 3"'$  (1.6 m), weight 237 lb (107.5 kg), SpO2 99 %.   Physical Exam  Constitutional: She appears well-developed and well-nourished. No distress.  HENT:  Head: Atraumatic.  Eyes: Conjunctivae are normal.  Neck: No JVD present. No thyromegaly present.  Cardiovascular: Normal rate, regular rhythm, S1 normal, S2 normal, intact distal pulses and normal pulses. Exam reveals no gallop.  Murmur heard.  Systolic murmur is present with a grade of 3/6. High-pitched blowing decrescendo early diastolic murmur is present with a grade of 2/6 at the upper right sternal border radiating to the apex. Pulses:      Carotid pulses are on the right side with bruit and on the left side with bruit. Femoral and popliteal pulse difficult to feel. Normal Normal pedal pulse. Bilateral carotid bruit present.  Right ankle warm and joint mildly red and tender with 2 plus pitting edema. No calf tenderness or warmth.   Pulmonary/Chest: Effort normal and breath sounds normal.  Abdominal: Soft. Bowel sounds are normal.  Obese and pannus present.   Musculoskeletal:        General: Normal range of motion.     Cervical back: Neck supple.  Neurological: She is alert.  Skin: Skin is warm and dry.  Psychiatric: She has a normal mood and affect.   Radiology: No results found.  Laboratory examination:   06/29/2018: Glucose 220, creatinine 1.0, EGFR 58/67, potassium 4.7, CMP otherwise normal.  Cholesterol 176, triglycerides 85, HDL 50, hemoglobin A1c 7.6%. Labs 04/01/2019:  HB 11.4/HCT 35.7, platelets 272.  BUN any 5, serum glucose 135, creatinine 1.2, eGFR >60 mL, potassium 4.6.  CMP normal. A1c 7.7%.  TSH normal.   Total cholesterol  180, triglycerides 91, HDL 55, LDL 107.  Non-HDL cholesterol 125.  PRN Meds:. Medications Discontinued During This Encounter  Medication Reason  . pravastatin (PRAVACHOL) 40 MG tablet Change in therapy   Current Meds  Medication Sig  . amLODipine (NORVASC) 10 MG tablet Take 1 tablet (10 mg total) by mouth daily.  Marland Kitchen aspirin 81 MG chewable tablet Chew 81 mg by mouth daily.  . colchicine 0.6 MG tablet TAKE 2 TABELTS BY MOUTH ONCE , REPEAT 1 TABLET ONE HOUR LATER  . glipiZIDE (GLUCOTROL) 5 MG tablet Take 5 mg by mouth. 2 two times daily  . hydrochlorothiazide (HYDRODIURIL) 25 MG tablet Take 25 mg by mouth daily.  Marland Kitchen labetalol (NORMODYNE) 100 MG tablet Take 1 tablet by mouth twice daily  . labetalol (NORMODYNE) 200 MG tablet Take 200 mg by mouth 2 (two) times daily. Takes with 100  mg  . labetalol (NORMODYNE) 300 MG tablet Take 300 mg by mouth 2 (two) times daily.  . metFORMIN (GLUCOPHAGE) 500 MG tablet Take by mouth 2 (two) times daily with a meal.  . PARoxetine (PAXIL) 20 MG tablet Take 20 mg by mouth daily.  . pioglitazone (ACTOS) 15 MG tablet Take 15 mg by mouth daily.  . pravastatin (PRAVACHOL) 40 MG tablet Take 40 mg by mouth every evening.  . [DISCONTINUED] pravastatin (PRAVACHOL) 40 MG tablet Take 40 mg by mouth daily.    Cardiac Studies:   Echocardiogram 09/10/2018: Left ventricle cavity is normal in size. Mild to moderate concentric hypertrophy of the left ventricle. Normal global wall motion. Indeterminate diastolic function due to lack of data. Calculated EF 56%. Left atrial cavity is mildly dilated LA measures at 3.9 cm. Mild aortic regurgitation. Moderate aortic valve leaflet calcification. Moderately restricted aortic valve leaflets. Mild to at most moderate aortic valve stenosis. Aortic valve peak pressure gradient of  42  and mean gradient of 20 mmHg, calculated aortic valve area 1.38 cm. Compared to the study done on 05/27/2017, no significant change.  Lexiscan Myoview  stress test 02/24/2014: 1. Resting EKG NSR. Stress EKG was non diagnostic for ischemia. No ST-T changes of ischemia noted with pharmacologic stress testing.  Stress symptoms included nausea/vomiting and SOB. Stress terminated due to completion of protocol. 2. Perfusion imaging study demonstrated mild soft tissue attenuation in the anterior and inferior wall. There was no e/o ischemia or scar. The left ventricular systolic function was normal at 77%. This is a low risk study.  Carotid artery duplex  08/15/2019: Stenosis in the right internal carotid artery (50-69%). Stenosis in the right external carotid artery (<50%). Minimal plaque left ICA. Antegrade right vertebral artery flow. Antegrade left vertebral artery flow. Compared to the study done on 08/09/2018, no significant change. Follow up in six months is appropriate if clinically indicated.  Assessment     ICD-10-CM   1. Essential hypertension  I10 amLODipine (NORVASC) 10 MG tablet  2. Pure hypercholesterolemia  E78.00   3. Moderate aortic regurgitation  I35.1     EKG 812/05/2019: Normal sinus rhythm at rate of 78 bpm, normal axis, right bundle branch block.  No evidence of ischemia.  No significant change from 08/23/2018.  Recommendations:   Brandi Peters  is a 70 y.o. female  with uncontrolled diabetes mellitus without complications, fibromyalgia, morbid obesity, mild to moderate aortic stenosis and mild aortic regurgitation, asymptomatic right carotid artery stenosis, Bradycardia presents here for 6 month office visit for hypertension and carotid stenosis management.  I had seen her 4 weeks ago with acute gouty arthritis, prescribed her colchicine she now presents for follow-up, blood pressure is elevated. I have added amlodipine 10 mg daily.  I reviewed her labs from her PCP, LDL is not at goal.  She does have carotid artery stenosis, goal LDL in view of diabetes and carotid disease is less than or equal to 70.  I would recommend  discontinuing pravastatin if the lipids are not at goal and switching her to Crestor 20 mg daily.  Clear cut instructions regarding this was given to the patient.  As patient states that she had labs just a week ago which I do not have, I did not discontinue pravastatin.  I will see her back in 6 months.  She'll follow-up with her PCP for hypertension management.   With regard to urinate regurgitation, she is remained stable without clinical evidence of heart failure.  Will  continue carotid artery surveillance.  Adrian Prows, MD, [facility] 11/11/2019, 10:13 AM [facility]   ZV:GJFT Collins, DO

## 2019-11-16 DIAGNOSIS — Z7189 Other specified counseling: Secondary | ICD-10-CM | POA: Diagnosis not present

## 2019-11-16 DIAGNOSIS — M109 Gout, unspecified: Secondary | ICD-10-CM | POA: Diagnosis not present

## 2019-11-16 DIAGNOSIS — E1122 Type 2 diabetes mellitus with diabetic chronic kidney disease: Secondary | ICD-10-CM | POA: Diagnosis not present

## 2019-11-16 DIAGNOSIS — I10 Essential (primary) hypertension: Secondary | ICD-10-CM | POA: Diagnosis not present

## 2019-11-16 DIAGNOSIS — I350 Nonrheumatic aortic (valve) stenosis: Secondary | ICD-10-CM | POA: Diagnosis not present

## 2019-11-16 DIAGNOSIS — M5136 Other intervertebral disc degeneration, lumbar region: Secondary | ICD-10-CM | POA: Diagnosis not present

## 2019-11-16 DIAGNOSIS — I6529 Occlusion and stenosis of unspecified carotid artery: Secondary | ICD-10-CM | POA: Diagnosis not present

## 2019-12-18 ENCOUNTER — Ambulatory Visit: Payer: Medicare Other | Attending: Internal Medicine

## 2019-12-18 DIAGNOSIS — Z23 Encounter for immunization: Secondary | ICD-10-CM

## 2019-12-18 NOTE — Progress Notes (Signed)
   Covid-19 Vaccination Clinic  Name:  Brandi Peters    MRN: [id number] DOB: [birth year]  12/18/2019  Brandi Peters was observed post Covid-19 immunization for 15 minutes without incidence. She was provided with Vaccine Information Sheet and instruction to access the V-Safe system.   Brandi Peters was instructed to call 911 with any severe reactions post vaccine: Marland Kitchen Difficulty breathing  . Swelling of your face and throat  . A fast heartbeat  . A bad rash all over your body  . Dizziness and weakness    Immunizations Administered    Name Date Dose VIS Date Route   Moderna COVID-19 Vaccine 12/18/2019 12:49 PM 0.5 mL 09/20/2019 Intramuscular   Manufacturer: Moderna   Lot: [id number]   NDC: 44514-604-79

## 2020-01-21 ENCOUNTER — Ambulatory Visit: Payer: Medicare Other | Attending: Internal Medicine

## 2020-01-21 DIAGNOSIS — Z23 Encounter for immunization: Secondary | ICD-10-CM

## 2020-01-21 NOTE — Progress Notes (Signed)
   Covid-19 Vaccination Clinic  Name:  Brandi Peters    MRN: [id number] DOB: 09-Nov-1950  01/21/2020  Brandi Peters was observed post Covid-19 immunization for 15 minutes without incident. She was provided with Vaccine Information Sheet and instruction to access the V-Safe system.   Brandi Peters was instructed to call 911 with any severe reactions post vaccine: Marland Kitchen Difficulty breathing  . Swelling of face and throat  . A fast heartbeat  . A bad rash all over body  . Dizziness and weakness   Immunizations Administered    Name Date Dose VIS Date Route   Moderna COVID-19 Vaccine 01/21/2020 12:30 PM 0.5 mL 09/20/2019 Intramuscular   Manufacturer: Moderna   Lot: [id number]   NDC: 61224-497-53

## 2020-02-09 DIAGNOSIS — E1122 Type 2 diabetes mellitus with diabetic chronic kidney disease: Secondary | ICD-10-CM | POA: Diagnosis not present

## 2020-02-09 DIAGNOSIS — M109 Gout, unspecified: Secondary | ICD-10-CM | POA: Diagnosis not present

## 2020-02-09 DIAGNOSIS — I10 Essential (primary) hypertension: Secondary | ICD-10-CM | POA: Diagnosis not present

## 2020-02-09 DIAGNOSIS — E559 Vitamin D deficiency, unspecified: Secondary | ICD-10-CM | POA: Diagnosis not present

## 2020-02-14 ENCOUNTER — Other Ambulatory Visit: Payer: Medicare Other

## 2020-02-16 DIAGNOSIS — I350 Nonrheumatic aortic (valve) stenosis: Secondary | ICD-10-CM | POA: Diagnosis not present

## 2020-02-16 DIAGNOSIS — N183 Chronic kidney disease, stage 3 unspecified: Secondary | ICD-10-CM | POA: Diagnosis not present

## 2020-02-16 DIAGNOSIS — E1122 Type 2 diabetes mellitus with diabetic chronic kidney disease: Secondary | ICD-10-CM | POA: Diagnosis not present

## 2020-02-16 DIAGNOSIS — I10 Essential (primary) hypertension: Secondary | ICD-10-CM | POA: Diagnosis not present

## 2020-02-16 DIAGNOSIS — E7800 Pure hypercholesterolemia, unspecified: Secondary | ICD-10-CM | POA: Diagnosis not present

## 2020-02-16 DIAGNOSIS — M109 Gout, unspecified: Secondary | ICD-10-CM | POA: Diagnosis not present

## 2020-02-16 DIAGNOSIS — M5136 Other intervertebral disc degeneration, lumbar region: Secondary | ICD-10-CM | POA: Diagnosis not present

## 2020-02-16 DIAGNOSIS — E559 Vitamin D deficiency, unspecified: Secondary | ICD-10-CM | POA: Diagnosis not present

## 2020-02-16 DIAGNOSIS — Z0000 Encounter for general adult medical examination without abnormal findings: Secondary | ICD-10-CM | POA: Diagnosis not present

## 2020-02-24 ENCOUNTER — Ambulatory Visit (INDEPENDENT_AMBULATORY_CARE_PROVIDER_SITE_OTHER): Payer: Medicare Other

## 2020-02-24 ENCOUNTER — Other Ambulatory Visit: Payer: Self-pay

## 2020-02-24 DIAGNOSIS — I6521 Occlusion and stenosis of right carotid artery: Secondary | ICD-10-CM

## 2020-02-26 ENCOUNTER — Other Ambulatory Visit: Payer: Self-pay | Admitting: Cardiology

## 2020-02-26 DIAGNOSIS — I6521 Occlusion and stenosis of right carotid artery: Secondary | ICD-10-CM

## 2020-02-29 NOTE — Progress Notes (Signed)
Right ICA 50-60% stenosis, discuss on OV. No change, stable

## 2020-04-19 ENCOUNTER — Other Ambulatory Visit: Payer: Self-pay | Admitting: Family Medicine

## 2020-04-19 DIAGNOSIS — Z1231 Encounter for screening mammogram for malignant neoplasm of breast: Secondary | ICD-10-CM

## 2020-05-08 DIAGNOSIS — E559 Vitamin D deficiency, unspecified: Secondary | ICD-10-CM | POA: Diagnosis not present

## 2020-05-08 DIAGNOSIS — M5136 Other intervertebral disc degeneration, lumbar region: Secondary | ICD-10-CM | POA: Diagnosis not present

## 2020-05-08 DIAGNOSIS — E7800 Pure hypercholesterolemia, unspecified: Secondary | ICD-10-CM | POA: Diagnosis not present

## 2020-05-08 DIAGNOSIS — E1122 Type 2 diabetes mellitus with diabetic chronic kidney disease: Secondary | ICD-10-CM | POA: Diagnosis not present

## 2020-05-10 ENCOUNTER — Encounter: Payer: Self-pay | Admitting: Cardiology

## 2020-05-10 ENCOUNTER — Other Ambulatory Visit: Payer: Self-pay

## 2020-05-10 ENCOUNTER — Ambulatory Visit (INDEPENDENT_AMBULATORY_CARE_PROVIDER_SITE_OTHER): Payer: Medicare Other | Admitting: Cardiology

## 2020-05-10 VITALS — BP 125/39 | HR 68 | Resp 16 | Ht 63.0 in | Wt 253.0 lb

## 2020-05-10 DIAGNOSIS — I10 Essential (primary) hypertension: Secondary | ICD-10-CM

## 2020-05-10 DIAGNOSIS — I351 Nonrheumatic aortic (valve) insufficiency: Secondary | ICD-10-CM

## 2020-05-10 DIAGNOSIS — E7800 Pure hypercholesterolemia, unspecified: Secondary | ICD-10-CM

## 2020-05-10 DIAGNOSIS — I6521 Occlusion and stenosis of right carotid artery: Secondary | ICD-10-CM | POA: Diagnosis not present

## 2020-05-10 MED ORDER — ROSUVASTATIN CALCIUM 20 MG PO TABS: 20.0000 mg | ORAL_TABLET | Freq: Every day | ORAL | 2 refills | Status: DC

## 2020-05-10 NOTE — Patient Instructions (Signed)
Please get blood work done in 2 months some time in Sept 20th week,

## 2020-05-10 NOTE — Progress Notes (Signed)
Primary Physician/Referring:  Janie Morning, DO  Patient ID: Brandi Peters, female    DOB: May 11, 1951, 69 y.o.   MRN: [id number]  Chief Complaint  Patient presents with  . Hypertension  . Carotid  . Follow-up    6 month    HPI: Brandi Peters  is a 69 y.o. female  with uncontrolled diabetes mellitus without complications, fibromyalgia, morbid obesity, mild to moderate aortic stenosis and mild aortic regurgitation, asymptomatic right carotid artery stenosis, Bradycardia presents here for 6 month office visit for hypertension and carotid stenosis management.  She is presently doing well and states that her blood pressure has been very well controlled.  She has noticed gradual increasing leg edema.  Dyspnea has remained stable, no PND or orthopnea.  Denies chest pain or palpitations. Symptoms of gout and gouty arthritis is essentially resolved being on colchicine.  She has a follow-up appointment with the PCP.  Past Medical History:  Diagnosis Date  . Diabetes mellitus without complication ([place])   . HTN (hypertension) 03/24/2019  . Hyperlipemia 03/24/2019  . Hyperlipidemia   . Hypertension   . Type 2 diabetes mellitus with complication, without long-term current use of insulin ([place]) 03/24/2019    Past Surgical History:  Procedure Laterality Date  . BREAST BIOPSY Left 06/23/2012  . BREAST EXCISIONAL BIOPSY Left a long time ago per pt   no visible scar    Social History   Tobacco Use  . Smoking status: Former Smoker    Packs/day: 1.00    Years: 25.00    Pack years: 25.00    Types: Cigarettes    Quit date: 2015    Years since quitting: 6.5  . Smokeless tobacco: Never Used  Substance Use Topics  . Alcohol use: Yes    Comment: on holidays    Marital Status: Married   Review of Systems  Cardiovascular: Positive for dyspnea on exertion. Negative for chest pain and leg swelling.  Musculoskeletal: Positive for arthritis and joint pain.  Gastrointestinal: Negative for melena.    Objective   Vitals with BMI 05/10/2020 11/11/2019 09/27/2019  Height 5' 3" 5' 3" 5' 3"  Weight 253 lbs 237 lbs 241 lbs  BMI 44.83 35.59 74.1  Systolic 638 453 646  Diastolic 39 69 90  Pulse 68 75 89    Blood pressure (!) 125/39, pulse 68, resp. rate 16, height 5' 3" (1.6 m), weight 253 lb (114.8 kg), SpO2 99 %.   Physical Exam Constitutional:      General: She is not in acute distress.    Appearance: She is well-developed.  Neck:     Thyroid: No thyromegaly.     Vascular: No JVD.  Cardiovascular:     Rate and Rhythm: Normal rate and regular rhythm.     Pulses: Normal pulses and intact distal pulses.          Carotid pulses are on the right side with bruit and on the left side with bruit.    Heart sounds: S1 normal and S2 normal. Murmur heard.  Systolic murmur is present with a grade of 3/6. High-pitched blowing decrescendo early diastolic murmur is present with a grade of 2/4 at the upper right sternal border radiating to the apex.  No gallop.      Comments: Femoral and popliteal pulse difficult to feel. Normal Normal pedal pulse. Bilateral carotid bruit present.  Right ankle warm and joint mildly red and tender with 2 plus pitting edema. No calf tenderness or warmth.  Pulmonary:     Effort: Pulmonary effort is normal.     Breath sounds: Normal breath sounds.  Abdominal:     General: Bowel sounds are normal.     Palpations: Abdomen is soft.     Comments: Obese and pannus present.     Radiology: No results found.  Laboratory examination:   External labs:  Lab 04/01/2019: Hb 11.4/HCT 35.7, platelets 272, normal indicis.    Serum glucose 135 mg, BUN 25, creatinine 1.2, EGFR greater than 60 mL, potassium 4.6, CMP otherwise normal.  Total cholesterol 180, triglycerides 91, HDL 55, LDL 107.  Non-HDL cholesterol 125.  TSH mildly elevated at 3.86.  06/29/2018: Glucose 220, creatinine 1.0, EGFR 58/67, potassium 4.7, CMP otherwise normal.    Cholesterol 176, triglycerides  85, HDL 50, hemoglobin A1c 7.6%.  Labs 04/01/2019:  HB 11.4/HCT 35.7, platelets 272.  BUN any 5, serum glucose 135, creatinine 1.2, eGFR >60 mL, potassium 4.6.  CMP normal. A1c 7.7%.  TSH normal.   Total cholesterol 180, triglycerides 91, HDL 55, LDL 107.  Non-HDL cholesterol 125.  Cardiac Studies:   Lexiscan Myoview stress test 02/24/2014: 1. Resting EKG NSR. Stress EKG was non diagnostic for ischemia. No ST-T changes of ischemia noted with pharmacologic stress testing.  Stress symptoms included nausea/vomiting and SOB. Stress terminated due to completion of protocol. 2. Perfusion imaging study demonstrated mild soft tissue attenuation in the anterior and inferior wall. There was no e/o ischemia or scar. The left ventricular systolic function was normal at 77%. This is a low risk study.  Echocardiogram 09/10/2018: Left ventricle cavity is normal in size. Mild to moderate concentric hypertrophy of the left ventricle. Normal global wall motion. Indeterminate diastolic function due to lack of data. Calculated EF 56%. Left atrial cavity is mildly dilated LA measures at 3.9 cm. Mild aortic regurgitation. Moderate aortic valve leaflet calcification. Moderately restricted aortic valve leaflets. Mild to at most moderate aortic valve stenosis. Aortic valve peak pressure gradient of  42  and mean gradient of 20 mmHg, calculated aortic valve area 1.38 cm. Compared to the study done on 05/27/2017, no significant change.  Carotid artery duplex 02/24/2020:  Stenosis in the right internal carotid artery (50-69%). Stenosis in the  right external carotid artery (<50%).  Minimal plaque left ICA.  Antegrade right vertebral artery flow. Antegrade left vertebral artery  flow.  Compared to the study done on 08/15/2019, no significant change. Follow up  in six months is appropriate if clinically indicated.  EKG:  EKG 05/10/2020: Normal sinus rhythm with rate of 74 bpm, normal axis.  Right bundle branch block.   No evidence of ischemia.  No significant change from 09/27/2019.  Assessment     ICD-10-CM   1. Essential hypertension  I10 EKG 12-Lead  2. Pure hypercholesterolemia  E78.00 rosuvastatin (CRESTOR) 20 MG tablet    Lipid Panel With LDL/HDL Ratio  3. Asymptomatic stenosis of right carotid artery  I65.21   4. Moderate aortic regurgitation  I35.1      Recommendations:   Brandi Peters  is a 69 y.o. female  with uncontrolled diabetes mellitus without complications, fibromyalgia, morbid obesity, mild to moderate aortic stenosis and mild aortic regurgitation, asymptomatic right carotid artery stenosis, Bradycardia presents here for 6 month office visit for hypertension and carotid stenosis management.   I reviewed her labs from her PCP, LDL is not at goal.  She does have carotid artery stenosis, goal LDL in view of diabetes and carotid disease is less than or  equal to 70.  I would recommend discontinuing pravastatin and switching her to Crestor 20 mg daily.  We will check lipids in 2 months.  Carotid artery duplex has remained stable.  She has had chronic leg edema and no change in aortic murmur and no clinical evidence of heart failure.  I suspect her leg edema is related to her diet and lack of physical activity.  I do not want to use diuretics in view of history of gout in the past.  However if after she makes lifestyle changes, she still has leg edema, either spironolactone 25 mg daily or Lasix on a as needed basis can be used.  I will see her back in 6 months.  She'll follow-up with her PCP for hypertension management.     , MD, FACC 05/10/2020, 9:47 AM Office: [phone]  

## 2020-05-17 DIAGNOSIS — N183 Chronic kidney disease, stage 3 unspecified: Secondary | ICD-10-CM | POA: Diagnosis not present

## 2020-05-17 DIAGNOSIS — E1122 Type 2 diabetes mellitus with diabetic chronic kidney disease: Secondary | ICD-10-CM | POA: Diagnosis not present

## 2020-05-17 DIAGNOSIS — I350 Nonrheumatic aortic (valve) stenosis: Secondary | ICD-10-CM | POA: Diagnosis not present

## 2020-05-17 DIAGNOSIS — M109 Gout, unspecified: Secondary | ICD-10-CM | POA: Diagnosis not present

## 2020-05-17 DIAGNOSIS — E7800 Pure hypercholesterolemia, unspecified: Secondary | ICD-10-CM | POA: Diagnosis not present

## 2020-05-17 DIAGNOSIS — M5410 Radiculopathy, site unspecified: Secondary | ICD-10-CM | POA: Diagnosis not present

## 2020-05-17 DIAGNOSIS — R011 Cardiac murmur, unspecified: Secondary | ICD-10-CM | POA: Diagnosis not present

## 2020-05-18 ENCOUNTER — Ambulatory Visit: Payer: Medicare Other

## 2020-06-06 ENCOUNTER — Other Ambulatory Visit: Payer: Self-pay

## 2020-06-06 DIAGNOSIS — E7800 Pure hypercholesterolemia, unspecified: Secondary | ICD-10-CM

## 2020-06-06 MED ORDER — ROSUVASTATIN CALCIUM 20 MG PO TABS: 20.0000 mg | ORAL_TABLET | Freq: Every day | ORAL | 3 refills | Status: DC

## 2020-06-27 ENCOUNTER — Other Ambulatory Visit: Payer: Self-pay

## 2020-06-27 ENCOUNTER — Ambulatory Visit
Admission: RE | Admit: 2020-06-27 | Discharge: 2020-06-27 | Disposition: A | Discharge status: Home / Self Care | Payer: Medicare Other | Source: Ambulatory Visit | Attending: Family Medicine | Admitting: Family Medicine

## 2020-06-27 DIAGNOSIS — Z1231 Encounter for screening mammogram for malignant neoplasm of breast: Secondary | ICD-10-CM

## 2020-07-02 DIAGNOSIS — Z961 Presence of intraocular lens: Secondary | ICD-10-CM | POA: Diagnosis not present

## 2020-07-02 DIAGNOSIS — H524 Presbyopia: Secondary | ICD-10-CM | POA: Diagnosis not present

## 2020-07-02 DIAGNOSIS — E119 Type 2 diabetes mellitus without complications: Secondary | ICD-10-CM | POA: Diagnosis not present

## 2020-07-09 ENCOUNTER — Other Ambulatory Visit: Payer: Self-pay | Admitting: Cardiology

## 2020-07-09 DIAGNOSIS — E7800 Pure hypercholesterolemia, unspecified: Secondary | ICD-10-CM | POA: Diagnosis not present

## 2020-07-10 LAB — LIPID PANEL WITH LDL/HDL RATIO
Cholesterol, Total: 149 mg/dL (ref 100–199)
HDL: 59 mg/dL (ref >39)
LDL Chol Calc (NIH): 81 mg/dL (ref 0–99)
LDL/HDL Ratio: 1.4 ratio (ref 0.0–3.2)
Triglycerides: 41 mg/dL (ref 0–149)
VLDL Cholesterol Cal: 9 mg/dL (ref 5–40)

## 2020-07-10 NOTE — Progress Notes (Signed)
LDL has improved from 107 with Crestor and need to reduce from 81 mg to LDL to <70. Would add Zetia if patient is willing to take

## 2020-07-11 NOTE — Progress Notes (Signed)
Called patient, NA

## 2020-07-12 NOTE — Progress Notes (Signed)
2nd attempt :  Called patient, did not answer her telephone when I called. I left her a message on her voicemail box asking her to call back for lab results.

## 2020-08-03 ENCOUNTER — Other Ambulatory Visit: Payer: Self-pay | Admitting: Cardiology

## 2020-08-03 DIAGNOSIS — E7800 Pure hypercholesterolemia, unspecified: Secondary | ICD-10-CM

## 2020-08-10 DIAGNOSIS — M109 Gout, unspecified: Secondary | ICD-10-CM | POA: Diagnosis not present

## 2020-08-10 DIAGNOSIS — N183 Chronic kidney disease, stage 3 unspecified: Secondary | ICD-10-CM | POA: Diagnosis not present

## 2020-08-10 DIAGNOSIS — E1122 Type 2 diabetes mellitus with diabetic chronic kidney disease: Secondary | ICD-10-CM | POA: Diagnosis not present

## 2020-08-17 DIAGNOSIS — Z23 Encounter for immunization: Secondary | ICD-10-CM | POA: Diagnosis not present

## 2020-08-17 DIAGNOSIS — R600 Localized edema: Secondary | ICD-10-CM | POA: Diagnosis not present

## 2020-08-17 DIAGNOSIS — I351 Nonrheumatic aortic (valve) insufficiency: Secondary | ICD-10-CM | POA: Diagnosis not present

## 2020-08-17 DIAGNOSIS — E1122 Type 2 diabetes mellitus with diabetic chronic kidney disease: Secondary | ICD-10-CM | POA: Diagnosis not present

## 2020-08-17 DIAGNOSIS — N183 Chronic kidney disease, stage 3 unspecified: Secondary | ICD-10-CM | POA: Diagnosis not present

## 2020-08-17 DIAGNOSIS — K5900 Constipation, unspecified: Secondary | ICD-10-CM | POA: Diagnosis not present

## 2020-08-22 DIAGNOSIS — Z23 Encounter for immunization: Secondary | ICD-10-CM | POA: Diagnosis not present

## 2020-08-29 ENCOUNTER — Ambulatory Visit: Payer: Medicare Other

## 2020-08-29 ENCOUNTER — Other Ambulatory Visit: Payer: Self-pay

## 2020-08-29 DIAGNOSIS — I6521 Occlusion and stenosis of right carotid artery: Secondary | ICD-10-CM | POA: Diagnosis not present

## 2020-09-02 ENCOUNTER — Telehealth: Payer: Self-pay | Admitting: Cardiology

## 2020-09-03 ENCOUNTER — Other Ambulatory Visit: Payer: Self-pay | Admitting: Cardiology

## 2020-09-03 DIAGNOSIS — I6523 Occlusion and stenosis of bilateral carotid arteries: Secondary | ICD-10-CM

## 2020-09-04 ENCOUNTER — Other Ambulatory Visit: Payer: Self-pay

## 2020-09-04 ENCOUNTER — Ambulatory Visit: Payer: Medicare Other

## 2020-09-04 DIAGNOSIS — I6523 Occlusion and stenosis of bilateral carotid arteries: Secondary | ICD-10-CM | POA: Diagnosis not present

## 2020-09-04 DIAGNOSIS — I6521 Occlusion and stenosis of right carotid artery: Secondary | ICD-10-CM

## 2020-09-10 NOTE — Progress Notes (Signed)
Yes, I will call you and discuss.

## 2020-09-10 NOTE — Progress Notes (Signed)
I will call her, not you apologies

## 2020-09-10 NOTE — Telephone Encounter (Signed)
Spoke with patient regarding results of carotid artery duplex. She verbalized understanding.

## 2020-09-10 NOTE — Progress Notes (Signed)
Called patient and discussed results. She verbalized understanding.

## 2020-09-17 DIAGNOSIS — E7800 Pure hypercholesterolemia, unspecified: Secondary | ICD-10-CM | POA: Diagnosis not present

## 2020-09-17 DIAGNOSIS — N183 Chronic kidney disease, stage 3 unspecified: Secondary | ICD-10-CM | POA: Diagnosis not present

## 2020-09-17 DIAGNOSIS — E559 Vitamin D deficiency, unspecified: Secondary | ICD-10-CM | POA: Diagnosis not present

## 2020-09-17 DIAGNOSIS — I350 Nonrheumatic aortic (valve) stenosis: Secondary | ICD-10-CM | POA: Diagnosis not present

## 2020-09-17 DIAGNOSIS — M5136 Other intervertebral disc degeneration, lumbar region: Secondary | ICD-10-CM | POA: Diagnosis not present

## 2020-09-17 DIAGNOSIS — I6529 Occlusion and stenosis of unspecified carotid artery: Secondary | ICD-10-CM | POA: Diagnosis not present

## 2020-09-17 DIAGNOSIS — K5900 Constipation, unspecified: Secondary | ICD-10-CM | POA: Diagnosis not present

## 2020-09-17 DIAGNOSIS — I351 Nonrheumatic aortic (valve) insufficiency: Secondary | ICD-10-CM | POA: Diagnosis not present

## 2020-09-17 DIAGNOSIS — E1122 Type 2 diabetes mellitus with diabetic chronic kidney disease: Secondary | ICD-10-CM | POA: Diagnosis not present

## 2020-10-31 DIAGNOSIS — R946 Abnormal results of thyroid function studies: Secondary | ICD-10-CM | POA: Diagnosis not present

## 2020-10-31 DIAGNOSIS — E559 Vitamin D deficiency, unspecified: Secondary | ICD-10-CM | POA: Diagnosis not present

## 2020-10-31 DIAGNOSIS — I6529 Occlusion and stenosis of unspecified carotid artery: Secondary | ICD-10-CM | POA: Diagnosis not present

## 2020-10-31 DIAGNOSIS — N183 Chronic kidney disease, stage 3 unspecified: Secondary | ICD-10-CM | POA: Diagnosis not present

## 2020-10-31 DIAGNOSIS — E7800 Pure hypercholesterolemia, unspecified: Secondary | ICD-10-CM | POA: Diagnosis not present

## 2020-10-31 DIAGNOSIS — E1122 Type 2 diabetes mellitus with diabetic chronic kidney disease: Secondary | ICD-10-CM | POA: Diagnosis not present

## 2020-11-05 ENCOUNTER — Ambulatory Visit: Payer: Medicare Other | Admitting: Cardiology

## 2020-11-12 ENCOUNTER — Ambulatory Visit: Payer: Medicare Other | Admitting: Cardiology

## 2020-11-14 ENCOUNTER — Encounter: Payer: Self-pay | Admitting: Cardiology

## 2020-11-14 ENCOUNTER — Other Ambulatory Visit: Payer: Self-pay

## 2020-11-14 ENCOUNTER — Ambulatory Visit: Payer: Medicare Other | Admitting: Cardiology

## 2020-11-14 VITALS — BP 130/78 | HR 79 | Temp 97.0°F | Resp 16 | Ht 63.0 in | Wt 263.0 lb

## 2020-11-14 DIAGNOSIS — I6529 Occlusion and stenosis of unspecified carotid artery: Secondary | ICD-10-CM | POA: Diagnosis not present

## 2020-11-14 DIAGNOSIS — I10 Essential (primary) hypertension: Secondary | ICD-10-CM | POA: Diagnosis not present

## 2020-11-14 DIAGNOSIS — E7800 Pure hypercholesterolemia, unspecified: Secondary | ICD-10-CM | POA: Diagnosis not present

## 2020-11-14 DIAGNOSIS — E1122 Type 2 diabetes mellitus with diabetic chronic kidney disease: Secondary | ICD-10-CM | POA: Diagnosis not present

## 2020-11-14 DIAGNOSIS — M5136 Other intervertebral disc degeneration, lumbar region: Secondary | ICD-10-CM | POA: Diagnosis not present

## 2020-11-14 DIAGNOSIS — I129 Hypertensive chronic kidney disease with stage 1 through stage 4 chronic kidney disease, or unspecified chronic kidney disease: Secondary | ICD-10-CM | POA: Diagnosis not present

## 2020-11-14 DIAGNOSIS — I6521 Occlusion and stenosis of right carotid artery: Secondary | ICD-10-CM | POA: Diagnosis not present

## 2020-11-14 DIAGNOSIS — N183 Chronic kidney disease, stage 3 unspecified: Secondary | ICD-10-CM | POA: Diagnosis not present

## 2020-11-14 DIAGNOSIS — I351 Nonrheumatic aortic (valve) insufficiency: Secondary | ICD-10-CM | POA: Diagnosis not present

## 2020-11-14 DIAGNOSIS — E559 Vitamin D deficiency, unspecified: Secondary | ICD-10-CM | POA: Diagnosis not present

## 2020-11-14 DIAGNOSIS — M109 Gout, unspecified: Secondary | ICD-10-CM | POA: Diagnosis not present

## 2020-11-14 NOTE — Progress Notes (Signed)
Primary Physician/Referring:  Brandi Morning, DO  Patient ID: Brandi Peters, female    DOB: Jul 17, 1951, 70 y.o.   MRN: [id number]  Chief Complaint  Patient presents with  . Hypertension  . Hyperlipidemia  . Asymptomatic stenosis of right carotid artery    HPI: Brandi Peters  is a 70 y.o. female  with uncontrolled diabetes mellitus without complications, fibromyalgia, morbid obesity, mild to moderate aortic stenosis and mild aortic regurgitation, asymptomatic right carotid artery stenosis (occlusion by duplex in Nov 2021), Bradycardia presents here for 6 month office visit for hypertension and carotid stenosis management.  She is presently doing well and states that her blood pressure has been very well controlled. Dyspnea has remained stable, no PND or orthopnea.  Denies chest pain or palpitations. No symptoms to suggest amaurosis or TIA or claudication.    Past Medical History:  Diagnosis Date  . Diabetes mellitus without complication ([place])   . HTN (hypertension) 03/24/2019  . Hyperlipemia 03/24/2019  . Type 2 diabetes mellitus with complication, without long-term current use of insulin ([place]) 03/24/2019    Past Surgical History:  Procedure Laterality Date  . BREAST BIOPSY Left 06/23/2012  . BREAST EXCISIONAL BIOPSY Left a long time ago per pt   no visible scar    Social History   Tobacco Use  . Smoking status: Former Smoker    Packs/day: 1.00    Years: 25.00    Pack years: 25.00    Types: Cigarettes    Quit date: 2015    Years since quitting: 7.0  . Smokeless tobacco: Never Used  Substance Use Topics  . Alcohol use: Yes    Comment: on holidays    Marital Status: Married   Review of Systems  Cardiovascular: Positive for dyspnea on exertion (stable). Negative for chest pain and leg swelling.  Musculoskeletal: Positive for arthritis and joint pain.  Gastrointestinal: Negative for melena.   Objective   Vitals with BMI 11/14/2020 11/14/2020 05/10/2020  Height - $Remove'5\' 3"'wQCfADb$  5'  3"  Weight - 263 lbs 253 lbs  BMI - 02.7 25.36  Systolic 644 034 742  Diastolic 78 76 39  Pulse 79 79 68    Blood pressure 130/78, pulse 79, temperature (!) 97 F (36.1 C), temperature source Temporal, resp. rate 16, height $RemoveBe'5\' 3"'enAwoohuN$  (1.6 m), weight 263 lb (119.3 kg), SpO2 99 %.   Physical Exam Constitutional:      General: She is not in acute distress.    Appearance: She is well-developed. She is obese.  Neck:     Thyroid: No thyromegaly.     Vascular: No JVD.  Cardiovascular:     Rate and Rhythm: Normal rate and regular rhythm.     Pulses: Normal pulses and intact distal pulses.          Carotid pulses are on the right side with bruit and on the left side with bruit.    Heart sounds: S1 normal and S2 normal. Murmur heard.   Systolic murmur is present with a grade of 3/6. High-pitched blowing decrescendo early diastolic murmur is present with a grade of 2/4 at the upper right sternal border radiating to the apex. No gallop.      Comments: Femoral and popliteal pulse difficult to feel. Normal Normal pedal pulse. Bilateral carotid bruit present.   No edema.  Pulmonary:     Effort: Pulmonary effort is normal.     Breath sounds: Normal breath sounds.  Abdominal:     General: Bowel  sounds are normal.     Palpations: Abdomen is soft.     Comments: Obese and large pannus present.    No Known Allergies  Current Outpatient Medications on File Prior to Visit  Medication Sig Dispense Refill  . allopurinol (ZYLOPRIM) 100 MG tablet Take 1 tablet by mouth daily.    Marland Kitchen aspirin 81 MG chewable tablet Chew 81 mg by mouth daily.    Marland Kitchen glipiZIDE (GLUCOTROL) 5 MG tablet Take 5 mg by mouth. 2 two times daily    . labetalol (NORMODYNE) 300 MG tablet Take 300 mg by mouth 2 (two) times daily.    Marland Kitchen losartan-hydrochlorothiazide (HYZAAR) 100-25 MG tablet Take 1 tablet by mouth daily.    Marland Kitchen PARoxetine (PAXIL) 20 MG tablet Take 20 mg by mouth daily.    . rosuvastatin (CRESTOR) 20 MG tablet TAKE 1 TABLET EVERY  DAY 90 tablet 1  . Vitamin D, Ergocalciferol, (DRISDOL) 1.25 MG (50000 UNIT) CAPS capsule Take 1 capsule by mouth once a week.     No current facility-administered medications on file prior to visit.    Radiology: No results found.  Laboratory examination:   External labs:   Lab 04/01/2019: Hb 11.4/HCT 35.7, platelets 272, normal indicis.    Serum glucose 135 mg, BUN 25, creatinine 1.2, EGFR greater than 60 mL, potassium 4.6, CMP otherwise normal.  Total cholesterol 180, triglycerides 91, HDL 55, LDL 107.  Non-HDL cholesterol 125.  TSH mildly elevated at 3.86.  06/29/2018: Glucose 220, creatinine 1.0, EGFR 58/67, potassium 4.7, CMP otherwise normal.    Cholesterol 176, triglycerides 85, HDL 50, hemoglobin A1c 7.6%.  Labs 04/01/2019:  HB 11.4/HCT 35.7, platelets 272.  BUN any 5, serum glucose 135, creatinine 1.2, eGFR >60 mL, potassium 4.6.  CMP normal. A1c 7.7%.  TSH normal.   Total cholesterol 180, triglycerides 91, HDL 55, LDL 107.  Non-HDL cholesterol 125.  Cardiac Studies:   Lexiscan Myoview stress test 02/24/2014: 1. Resting EKG NSR. Stress EKG was non diagnostic for ischemia. No ST-T changes of ischemia noted with pharmacologic stress testing.  Stress symptoms included nausea/vomiting and SOB. Stress terminated due to completion of protocol. 2. Perfusion imaging study demonstrated mild soft tissue attenuation in the anterior and inferior wall. There was no e/o ischemia or scar. The left ventricular systolic function was normal at 77%. This is a low risk study.  Echocardiogram 09/10/2018: Left ventricle cavity is normal in size. Mild to moderate concentric hypertrophy of the left ventricle. Normal global wall motion. Indeterminate diastolic function due to lack of data. Calculated EF 56%. Left atrial cavity is mildly dilated LA measures at 3.9 cm. Mild aortic regurgitation. Moderate aortic valve leaflet calcification. Moderately restricted aortic valve leaflets. Mild to at  most moderate aortic valve stenosis. Aortic valve peak pressure gradient of  42  and mean gradient of 20 mmHg, calculated aortic valve area 1.38 cm. Compared to the study done on 05/27/2017, no significant change.  Carotid artery duplex 08/29/2020: Doppler suggests stenosis in the right internal carotid artery (total occlusion) in the right mid ICA and a 50-69% stenosis in the right proximal ICA.  The right PSV internal/common carotid artery ratio of 4.72 is consistent with a stenosis of >70%. Stenosis in the right external carotid artery (<50%). Suggests stenosis in the left external carotid artery (<50%). Antegrade right vertebral artery flow. Antegrade left vertebral artery flow. Compared to 02/24/2020, right ICA occlusion is new and previously was 50-69% range. Follow up in four months is appropriate if clinically indicated.  Carotid artery duplex 09/04/2020 unilateral - right: Study done to confirm 08/29/2020 study findings of new right ICA occlusion. Doppler suggests stenosis in the right internal carotid artery (total occlusion) in the mid to distal right ICA and a 50-69% proximal ICA stenosis. Doppler velocity suggests stenosis in the right external carotid artery (<50%). Antegrade right vertebral artery flow. Study confirms right mid to distal ICA occlusion compared to 02/24/2020. Follow up in six months is appropriate if clinically Indicated.   EKG:   EKG 11/14/2020: Normal sinus rhythm with rate of 78 bpm, left atrial enlargement, normal axis, right bundle branch block.  No evidence of ischemia.  No significant change from 05/10/2020.  Assessment     ICD-10-CM   1. Asymptomatic stenosis of right carotid artery  I65.21 EKG 12-Lead    PCV CAROTID DUPLEX (BILATERAL)  2. Essential hypertension  I10   3. Pure hypercholesterolemia  E78.00   4. Moderate aortic regurgitation  I35.1     No orders of the defined types were placed in this encounter.  Medications Discontinued During  This Encounter  Medication Reason  . pioglitazone (ACTOS) 15 MG tablet Error   Orders Placed This Encounter  Procedures  . EKG 12-Lead       Recommendations:   Brandi Peters  is a 70 y.o. with uncontrolled diabetes mellitus without complications, fibromyalgia, morbid obesity, mild to moderate aortic stenosis and mild aortic regurgitation, asymptomatic right carotid artery stenosis (occlusion by duplex in Nov 2021), Bradycardia presents here for 6 month office visit for hypertension and carotid stenosis management.    Prior labs reviewed, she has had recent labs and she is seeing Dr. Theda Sers today. She does have carotid artery stenosis, goal LDL in view of diabetes and carotid disease is less than or equal to 70.  Her last office visit pravastatin was discontinued and she was started on rosuvastatin which she is tolerating.  If LDL is not at goal, consider addition of Zetia 10 mg daily.  Blood pressure is well controlled.  We discussed regarding complete avoidance of vaping.  We have a 10 pound to 15 pound weight loss goal.  Otherwise she is doing well, in spite of right carotid artery occlusion that was noted recently during her carotid duplex, she is asymptomatic.  We will continue to monitor this, will repeat carotid duplex in 6 months and I would like to see her back then.  It is great to see her today.    Adrian Prows, MD, [facility] 11/14/2020, 4:07 PM Office: [phone]

## 2020-11-19 NOTE — Progress Notes (Signed)
Lab 10/31/2020:  Hb 11.5/HCT 36.3, platelets 259, normal indicis.  Serum glucose 180 mg, BUN 27, creatinine 1.07, EGFR 61 mL. Sodium 134, potassium 5.0, CMP otherwise normal.  A1c 7.9%. TSH 2.990, normal.  Vitamin D 46.9.  Total cholesterol 177, triglycerides 74, HDL 62, LDL 101.

## 2021-03-07 DIAGNOSIS — E559 Vitamin D deficiency, unspecified: Secondary | ICD-10-CM | POA: Diagnosis not present

## 2021-03-07 DIAGNOSIS — N183 Chronic kidney disease, stage 3 unspecified: Secondary | ICD-10-CM | POA: Diagnosis not present

## 2021-03-07 DIAGNOSIS — E7800 Pure hypercholesterolemia, unspecified: Secondary | ICD-10-CM | POA: Diagnosis not present

## 2021-03-07 DIAGNOSIS — E1122 Type 2 diabetes mellitus with diabetic chronic kidney disease: Secondary | ICD-10-CM | POA: Diagnosis not present

## 2021-03-14 DIAGNOSIS — Z0000 Encounter for general adult medical examination without abnormal findings: Secondary | ICD-10-CM | POA: Diagnosis not present

## 2021-03-14 DIAGNOSIS — E1122 Type 2 diabetes mellitus with diabetic chronic kidney disease: Secondary | ICD-10-CM | POA: Diagnosis not present

## 2021-03-14 DIAGNOSIS — E7800 Pure hypercholesterolemia, unspecified: Secondary | ICD-10-CM | POA: Diagnosis not present

## 2021-03-14 DIAGNOSIS — E559 Vitamin D deficiency, unspecified: Secondary | ICD-10-CM | POA: Diagnosis not present

## 2021-03-14 DIAGNOSIS — I6529 Occlusion and stenosis of unspecified carotid artery: Secondary | ICD-10-CM | POA: Diagnosis not present

## 2021-03-14 DIAGNOSIS — M5136 Other intervertebral disc degeneration, lumbar region: Secondary | ICD-10-CM | POA: Diagnosis not present

## 2021-03-14 DIAGNOSIS — I351 Nonrheumatic aortic (valve) insufficiency: Secondary | ICD-10-CM | POA: Diagnosis not present

## 2021-03-25 ENCOUNTER — Other Ambulatory Visit: Payer: Self-pay | Admitting: Cardiology

## 2021-03-25 DIAGNOSIS — E7800 Pure hypercholesterolemia, unspecified: Secondary | ICD-10-CM

## 2021-03-25 DIAGNOSIS — Z23 Encounter for immunization: Secondary | ICD-10-CM | POA: Diagnosis not present

## 2021-05-01 ENCOUNTER — Other Ambulatory Visit: Payer: Self-pay

## 2021-05-01 ENCOUNTER — Ambulatory Visit: Payer: Medicare Other

## 2021-05-01 DIAGNOSIS — I6521 Occlusion and stenosis of right carotid artery: Secondary | ICD-10-CM | POA: Diagnosis not present

## 2021-05-16 ENCOUNTER — Other Ambulatory Visit: Payer: Self-pay

## 2021-05-16 ENCOUNTER — Ambulatory Visit: Payer: Medicare Other | Admitting: Cardiology

## 2021-05-16 ENCOUNTER — Encounter: Payer: Self-pay | Admitting: Cardiology

## 2021-05-16 VITALS — BP 130/70 | HR 91 | Temp 97.2°F | Ht 63.0 in | Wt 245.0 lb

## 2021-05-16 DIAGNOSIS — R0609 Other forms of dyspnea: Secondary | ICD-10-CM | POA: Diagnosis not present

## 2021-05-16 DIAGNOSIS — I350 Nonrheumatic aortic (valve) stenosis: Secondary | ICD-10-CM | POA: Diagnosis not present

## 2021-05-16 DIAGNOSIS — R0600 Dyspnea, unspecified: Secondary | ICD-10-CM

## 2021-05-16 DIAGNOSIS — I10 Essential (primary) hypertension: Secondary | ICD-10-CM

## 2021-05-16 DIAGNOSIS — I6521 Occlusion and stenosis of right carotid artery: Secondary | ICD-10-CM

## 2021-05-16 DIAGNOSIS — E7800 Pure hypercholesterolemia, unspecified: Secondary | ICD-10-CM | POA: Diagnosis not present

## 2021-05-16 MED ORDER — EZETIMIBE 10 MG PO TABS: 10.0000 mg | ORAL_TABLET | Freq: Every day | ORAL | 2 refills | Status: DC

## 2021-05-16 NOTE — Patient Instructions (Signed)
Please take the new medication called ezetimibe 10 mg after supper every day.  You have been scheduled for cholesterol check in 1 month from now approximately, go to any [facility] and you do not need any appointment.  Go after 6 hours of fasting.  You are also scheduled for echocardiogram which is the ultrasound of your heart to follow-up on your aortic stenosis.  You need continued surveillance of your carotid artery stenosis.  You were lost about 20 pounds in weight since January 2022, congratulations.  Continue to do so.

## 2021-05-16 NOTE — Progress Notes (Signed)
Primary Physician/Referring:  Janie Morning, DO  Patient ID: Brandi Peters, female    DOB: 1950-12-03, 70 y.o.   MRN: [id number]  Chief Complaint  Patient presents with   Hypertension   Follow-up   Results   Carotid    HPI: Brandi Peters  is a 70 y.o. female  with uncontrolled diabetes mellitus without complications, fibromyalgia, morbid obesity, mild to moderate aortic stenosis and mild aortic regurgitation, asymptomatic bilateral carotid artery stenosis, Bradycardia presents here for 6 month office visit for aortic stenosis, hypertension and carotid stenosis management.  She is presently doing well and states that her blood pressure has been very well controlled. Dyspnea has remained stable, no PND or orthopnea.  Denies chest pain or palpitations.  She has had night cramps in her legs.  No dizziness or syncope.  Past Medical History:  Diagnosis Date   Diabetes mellitus without complication ([place])    HTN (hypertension) 03/24/2019   Hyperlipemia 03/24/2019   Type 2 diabetes mellitus with complication, without long-term current use of insulin ([place]) 03/24/2019    Past Surgical History:  Procedure Laterality Date   BREAST BIOPSY Left 06/23/2012   BREAST EXCISIONAL BIOPSY Left a long time ago per pt   no visible scar    Social History   Tobacco Use   Smoking status: Former    Packs/day: 1.00    Years: 25.00    Pack years: 25.00    Types: Cigarettes    Quit date: 2015    Years since quitting: 7.5   Smokeless tobacco: Never  Substance Use Topics   Alcohol use: Yes    Comment: on holidays    Marital Status: Married   Review of Systems  Cardiovascular:  Positive for dyspnea on exertion (stable). Negative for chest pain and leg swelling.  Musculoskeletal:  Positive for arthritis and joint pain.  Gastrointestinal:  Negative for melena.  Objective   Vitals with BMI 05/16/2021 11/14/2020 11/14/2020  Height $Remov'5\' 3"'fXJHmz$  - $'5\' 3"'E$   Weight 245 lbs - 263 lbs  BMI 18.84 - 16.6  Systolic  063 016 010  Diastolic 70 78 76  Pulse 91 79 79    Blood pressure 130/70, pulse 91, temperature (!) 97.2 F (36.2 C), height $RemoveBe'5\' 3"'feEzKdssX$  (1.6 m), weight 245 lb (111.1 kg), SpO2 98 %.   Physical Exam Constitutional:      General: She is not in acute distress.    Appearance: She is well-developed. She is obese.  Neck:     Thyroid: No thyromegaly.     Vascular: Carotid bruit (Bilateral) present. No JVD.  Cardiovascular:     Rate and Rhythm: Normal rate and regular rhythm.     Pulses:          Dorsalis pedis pulses are 0 on the right side and 0 on the left side.       Posterior tibial pulses are 0 on the right side and 0 on the left side.     Heart sounds: Heart sounds are distant. Murmur heard.  Harsh midsystolic murmur is present with a grade of 3/6 at the upper right sternal border radiating to the neck.  High-pitched blowing decrescendo early diastolic murmur is present with a grade of 2/4 at the upper right sternal border radiating to the apex.    No gallop.     Comments: Femoral and popliteal pulse difficult to feel due to body habitus Pulmonary:     Effort: Pulmonary effort is normal.  Breath sounds: Normal breath sounds.  Abdominal:     General: Bowel sounds are normal.     Palpations: Abdomen is soft.     Comments: Obese and large pannus present.   Musculoskeletal:        General: No swelling.  Skin:    General: Skin is warm.  Neurological:     General: No focal deficit present.    No Known Allergies   Current Outpatient Medications on File Prior to Visit  Medication Sig Dispense Refill   allopurinol (ZYLOPRIM) 100 MG tablet Take 1 tablet by mouth daily.     aspirin 81 MG chewable tablet Chew 81 mg by mouth daily.     glipiZIDE (GLUCOTROL) 5 MG tablet Take 5 mg by mouth. 2 two times daily     labetalol (NORMODYNE) 300 MG tablet Take 300 mg by mouth 2 (two) times daily.     losartan-hydrochlorothiazide (HYZAAR) 100-25 MG tablet Take 1 tablet by mouth daily.      PARoxetine (PAXIL) 20 MG tablet Take 20 mg by mouth daily.     rosuvastatin (CRESTOR) 20 MG tablet TAKE 1 TABLET EVERY DAY 90 tablet 1   Vitamin D, Ergocalciferol, (DRISDOL) 1.25 MG (50000 UNIT) CAPS capsule Take 1 capsule by mouth once a week.     No current facility-administered medications on file prior to visit.    Medications after today's encounter:  Current Outpatient Medications  Medication Instructions   allopurinol (ZYLOPRIM) 100 MG tablet 1 tablet, Oral, Daily   aspirin 81 mg, Oral, Daily   ezetimibe (ZETIA) 10 mg, Oral, Daily after supper   glipiZIDE (GLUCOTROL) 5 mg, Oral, 2 two times daily    labetalol (NORMODYNE) 300 mg, Oral, 2 times daily   losartan-hydrochlorothiazide (HYZAAR) 100-25 MG tablet 1 tablet, Oral, Daily   PARoxetine (PAXIL) 20 mg, Oral, Daily   rosuvastatin (CRESTOR) 20 MG tablet TAKE 1 TABLET EVERY DAY   Vitamin D, Ergocalciferol, (DRISDOL) 1.25 MG (50000 UNIT) CAPS capsule 1 capsule, Oral, Weekly    Radiology: No results found.  Laboratory examination:   External labs:  Labs 10/31/2020:  Hb 11.5/HCT 36.3, platelets 259, normal indicis.  Serum glucose 180 mg, BUN 27, creatinine 1.07, EGFR 61 mL. Sodium 134, potassium 5.0, CMP otherwise normal.  A1c 7.9%. TSH 2.990, normal.  Vitamin D 46.9.  Total cholesterol 177, triglycerides 74, HDL 62, LDL 101.   Lab 04/01/2019: Hb 11.4/HCT 35.7, platelets 272, normal indicis.    Serum glucose 135 mg, BUN 25, creatinine 1.2, EGFR greater than 60 mL, potassium 4.6, CMP otherwise normal.  Total cholesterol 180, triglycerides 91, HDL 55, LDL 107.  Non-HDL cholesterol 125.  TSH mildly elevated at 3.86.  06/29/2018: Glucose 220, creatinine 1.0, EGFR 58/67, potassium 4.7, CMP otherwise normal.    Cardiac Studies:   Lexiscan Myoview stress test 02/24/2014: 1. Resting EKG NSR. Stress EKG was non diagnostic for ischemia. No ST-T changes of ischemia noted with pharmacologic stress testing.  Stress symptoms  included nausea/vomiting and SOB. Stress terminated due to completion of protocol. 2. Perfusion imaging study demonstrated mild soft tissue attenuation in the anterior and inferior wall. There was no e/o ischemia or scar. The left ventricular systolic function was normal at 77%. This is a low risk study.  Echocardiogram 09/10/2018: Left ventricle cavity is normal in size. Mild to moderate concentric hypertrophy of the left ventricle. Normal global wall motion. Indeterminate diastolic function due to lack of data. Calculated EF 56%. Left atrial cavity is mildly dilated LA measures  at 3.9 cm. Mild aortic regurgitation. Moderate aortic valve leaflet calcification. Moderately restricted aortic valve leaflets. Mild to at most moderate aortic valve stenosis. Aortic valve peak pressure gradient of  42  and mean gradient of 20 mmHg, calculated aortic valve area 1.38 cm. Compared to the study done on 05/27/2017, no significant change.  Carotid artery duplex 05/01/2021: Duplex suggests stenosis in the right internal carotid artery (>=70%). The right PSV internal/common carotid artery ratio of 8.95 is consistent with a stenosis of >70%. Peak velocity 305/76 cm/S. Duplex suggests stenosis in the right external carotid artery (<50%). Duplex suggests stenosis in the left external carotid artery (<50%). Antegrade right vertebral artery flow. Antegrade left vertebral artery flow. Compared to study done on 09/04/2020, right ICA occlusion report is not evident in the study.  Otherwise no significant change.  Consider other imaging modality to confirm stenosis severity. Follow up in six months is appropriate if clinically indicated.  EKG:  EKG 05/16/2021: Normal sinus rhythm at rate of 82 bpm, left atrial enlargement, normal axis.  Right bundle branch block.  No evidence of ischemia. EKG 11/14/2020: Normal sinus rhythm with rate of 78 bpm, left atrial enlargement, normal axis, right bundle branch block.  No evidence  of ischemia.  No significant change from 05/10/2020.  Assessment     ICD-10-CM   1. Asymptomatic stenosis of right carotid artery  I65.21 EKG 12-Lead    PCV CAROTID DUPLEX (BILATERAL)    2. Moderate aortic stenosis  I35.0 PCV ECHOCARDIOGRAM COMPLETE    3. Essential hypertension  I10 EKG 12-Lead    4. Pure hypercholesterolemia  E78.00 ezetimibe (ZETIA) 10 MG tablet    Lipid Panel With LDL/HDL Ratio    5. Dyspnea on exertion  R06.00 Brain natriuretic peptide      Meds ordered this encounter  Medications   ezetimibe (ZETIA) 10 MG tablet    Sig: Take 1 tablet (10 mg total) by mouth daily after supper.    Dispense:  30 tablet    Refill:  2   There are no discontinued medications.  Orders Placed This Encounter  Procedures   Lipid Panel With LDL/HDL Ratio   Brain natriuretic peptide   EKG 12-Lead   PCV ECHOCARDIOGRAM COMPLETE    Standing Status:   Future    Standing Expiration Date:   05/16/2022      Recommendations:   KATYANA TROLINGER  is a 70 y.o. with uncontrolled diabetes mellitus without complications, fibromyalgia, morbid obesity, mild to moderate aortic stenosis and mild aortic regurgitation, asymptomatic bilateral carotid artery stenosis, Bradycardia presents here for 6 month office visit for hypertension and carotid stenosis management.  Except for chronic dyspnea, leg cramps at night, no other specific symptoms.  I reviewed her external labs, I will add Zetia 10 mg daily to get LDL to <70, check lipids in 4 to 6 weeks.  Aortic stenotic murmur appears much more prominent with a muffled S2, will repeat echocardiogram.  With regard to carotid stenosis, occlusion that was noted in November 2021 is not present and she still has about a 70% stenosis on the right and less than 70 on the left internal carotid artery.  We will continue surveillance and secondary prevention.  With regard to dyspnea, suspect chronic diastolic heart failure.  Blood pressure is well controlled.  We  will also obtain a baseline BMP along with her lipids.  Today she is not in acute decompensated heart failure.  She has lost about 20 to 25 pounds in weight  since January 2022, congratulated her.  I would like to see her back in 6 weeks for follow-up. 40 minute OV encounter.    Adrian Prows, MD, [facility] 05/16/2021, 9:12 AM Office: [phone]

## 2021-05-20 ENCOUNTER — Other Ambulatory Visit: Payer: Self-pay | Admitting: Family Medicine

## 2021-05-20 DIAGNOSIS — Z1231 Encounter for screening mammogram for malignant neoplasm of breast: Secondary | ICD-10-CM

## 2021-05-22 ENCOUNTER — Other Ambulatory Visit: Payer: Self-pay

## 2021-05-22 ENCOUNTER — Ambulatory Visit: Payer: Medicare Other

## 2021-05-22 DIAGNOSIS — I350 Nonrheumatic aortic (valve) stenosis: Secondary | ICD-10-CM

## 2021-05-26 NOTE — Progress Notes (Signed)
Stable echo, normal heart function. Moderate AS not significantly changed from prevous,   Aortic valve is between the main chamber of the heart and the aorta. Its job is to prevent blood leaking back into the heart when the heart has finished pumping the blood out and is relaxing.  AS means Aortic stenosis, narrowing on the aortic valve. it can be mild, moderate or severe. Valve replacement needed only if severe AS. Otherwise simply to be monitored  by doctors or serial echocardiograms depending upon clinical situation.  Mitral stenosis is not of clinical importance.

## 2021-05-27 DIAGNOSIS — R0600 Dyspnea, unspecified: Secondary | ICD-10-CM | POA: Diagnosis not present

## 2021-05-27 DIAGNOSIS — E7800 Pure hypercholesterolemia, unspecified: Secondary | ICD-10-CM | POA: Diagnosis not present

## 2021-05-27 NOTE — Progress Notes (Signed)
Called patient, NA, LMAM

## 2021-05-28 LAB — LIPID PANEL WITH LDL/HDL RATIO
Cholesterol, Total: 124 mg/dL (ref 100–199)
HDL: 54 mg/dL
LDL Chol Calc (NIH): 56 mg/dL (ref 0–99)
LDL/HDL Ratio: 1 ratio (ref 0.0–3.2)
Triglycerides: 67 mg/dL (ref 0–149)
VLDL Cholesterol Cal: 14 mg/dL (ref 5–40)

## 2021-05-28 LAB — BRAIN NATRIURETIC PEPTIDE: BNP: 76.8 pg/mL (ref 0.0–100.0)

## 2021-05-29 NOTE — Progress Notes (Signed)
Called and spoke to pt, pt voiced understanding.

## 2021-06-10 DIAGNOSIS — E1122 Type 2 diabetes mellitus with diabetic chronic kidney disease: Secondary | ICD-10-CM | POA: Diagnosis not present

## 2021-06-17 DIAGNOSIS — E559 Vitamin D deficiency, unspecified: Secondary | ICD-10-CM | POA: Diagnosis not present

## 2021-06-17 DIAGNOSIS — M5136 Other intervertebral disc degeneration, lumbar region: Secondary | ICD-10-CM | POA: Diagnosis not present

## 2021-06-17 DIAGNOSIS — E1122 Type 2 diabetes mellitus with diabetic chronic kidney disease: Secondary | ICD-10-CM | POA: Diagnosis not present

## 2021-06-17 DIAGNOSIS — I6529 Occlusion and stenosis of unspecified carotid artery: Secondary | ICD-10-CM | POA: Diagnosis not present

## 2021-06-17 DIAGNOSIS — I351 Nonrheumatic aortic (valve) insufficiency: Secondary | ICD-10-CM | POA: Diagnosis not present

## 2021-06-17 DIAGNOSIS — E7800 Pure hypercholesterolemia, unspecified: Secondary | ICD-10-CM | POA: Diagnosis not present

## 2021-07-02 DIAGNOSIS — Z961 Presence of intraocular lens: Secondary | ICD-10-CM | POA: Diagnosis not present

## 2021-07-02 DIAGNOSIS — Z7984 Long term (current) use of oral hypoglycemic drugs: Secondary | ICD-10-CM | POA: Diagnosis not present

## 2021-07-02 DIAGNOSIS — E119 Type 2 diabetes mellitus without complications: Secondary | ICD-10-CM | POA: Diagnosis not present

## 2021-07-03 ENCOUNTER — Ambulatory Visit: Payer: Medicare Other | Admitting: Cardiology

## 2021-07-09 ENCOUNTER — Other Ambulatory Visit: Payer: Self-pay

## 2021-07-09 ENCOUNTER — Ambulatory Visit
Admission: RE | Admit: 2021-07-09 | Discharge: 2021-07-09 | Disposition: A | Discharge status: Home / Self Care | Payer: Medicare Other | Source: Ambulatory Visit | Attending: Family Medicine | Admitting: Family Medicine

## 2021-07-09 DIAGNOSIS — Z1231 Encounter for screening mammogram for malignant neoplasm of breast: Secondary | ICD-10-CM | POA: Diagnosis not present

## 2021-07-16 ENCOUNTER — Ambulatory Visit: Payer: Medicare Other | Admitting: Cardiology

## 2021-07-16 NOTE — Progress Notes (Deleted)
Primary Physician/Referring:  Janie Morning, DO  Patient ID: Brandi Peters, female    DOB: [birth year], 70 y.o.   MRN: [id number]  No chief complaint on file.   HPI: Brandi Peters  is a 70 y.o. female  with uncontrolled diabetes mellitus without complications, fibromyalgia, morbid obesity, mild to moderate aortic stenosis and mild aortic regurgitation, asymptomatic bilateral carotid artery stenosis, Bradycardia presents here for 6 month office visit for aortic stenosis, hypertension and carotid stenosis management.  She is presently doing well and states that her blood pressure has been very well controlled. Dyspnea has remained stable, no PND or orthopnea.  Denies chest pain or palpitations.  She has had night cramps in her legs.  No dizziness or syncope.  Past Medical History:  Diagnosis Date   Diabetes mellitus without complication ([place])    HTN (hypertension) 03/24/2019   Hyperlipemia 03/24/2019   Type 2 diabetes mellitus with complication, without long-term current use of insulin ([place]) 03/24/2019    Past Surgical History:  Procedure Laterality Date   BREAST BIOPSY Left 06/23/2012   BREAST EXCISIONAL BIOPSY Left a long time ago per pt   no visible scar    Social History   Tobacco Use   Smoking status: Former    Packs/day: 1.00    Years: 25.00    Pack years: 25.00    Types: Cigarettes    Quit date: 2015    Years since quitting: 7.7   Smokeless tobacco: Never  Substance Use Topics   Alcohol use: Yes    Comment: on holidays    Marital Status: Married   Review of Systems  Cardiovascular:  Positive for dyspnea on exertion (stable). Negative for chest pain and leg swelling.  Musculoskeletal:  Positive for arthritis and joint pain.  Gastrointestinal:  Negative for melena.  Objective   Vitals with BMI 05/16/2021 11/14/2020 11/14/2020  Height _0  - _1   Weight 245 lbs - 263 lbs  BMI 18.29 - 93.7  Systolic 169 678 938  Diastolic 70 78 76  Pulse 91 79 79    There were  no vitals taken for this visit.   Physical Exam Constitutional:      General: She is not in acute distress.    Appearance: She is well-developed. She is obese.  Neck:     Thyroid: No thyromegaly.     Vascular: Carotid bruit (Bilateral) present. No JVD.  Cardiovascular:     Rate and Rhythm: Normal rate and regular rhythm.     Pulses:          Dorsalis pedis pulses are 0 on the right side and 0 on the left side.       Posterior tibial pulses are 0 on the right side and 0 on the left side.     Heart sounds: Heart sounds are distant. Murmur heard.  Harsh midsystolic murmur is present with a grade of 3/6 at the upper right sternal border radiating to the neck.  High-pitched blowing decrescendo early diastolic murmur is present with a grade of 2/4 at the upper right sternal border radiating to the apex.    No gallop.     Comments: Femoral and popliteal pulse difficult to feel due to body habitus Pulmonary:     Effort: Pulmonary effort is normal.     Breath sounds: Normal breath sounds.  Abdominal:     General: Bowel sounds are normal.     Palpations: Abdomen is soft.     Comments: Obese  and large pannus present.   Musculoskeletal:        General: No swelling.  Skin:    General: Skin is warm.  Neurological:     General: No focal deficit present.    No Known Allergies   Current Outpatient Medications on File Prior to Visit  Medication Sig Dispense Refill   allopurinol (ZYLOPRIM) 100 MG tablet Take 1 tablet by mouth daily.     aspirin 81 MG chewable tablet Chew 81 mg by mouth daily.     ezetimibe (ZETIA) 10 MG tablet Take 1 tablet (10 mg total) by mouth daily after supper. 30 tablet 2   glipiZIDE (GLUCOTROL) 5 MG tablet Take 5 mg by mouth. 2 two times daily     labetalol (NORMODYNE) 300 MG tablet Take 300 mg by mouth 2 (two) times daily.     losartan-hydrochlorothiazide (HYZAAR) 100-25 MG tablet Take 1 tablet by mouth daily.     PARoxetine (PAXIL) 20 MG tablet Take 20 mg by mouth  daily.     rosuvastatin (CRESTOR) 20 MG tablet TAKE 1 TABLET EVERY DAY 90 tablet 1   Vitamin D, Ergocalciferol, (DRISDOL) 1.25 MG (50000 UNIT) CAPS capsule Take 1 capsule by mouth once a week.     No current facility-administered medications on file prior to visit.    Medications after today's encounter:  Current Outpatient Medications  Medication Instructions   allopurinol (ZYLOPRIM) 100 MG tablet 1 tablet, Oral, Daily   aspirin 81 mg, Oral, Daily   ezetimibe (ZETIA) 10 mg, Oral, Daily after supper   glipiZIDE (GLUCOTROL) 5 mg, Oral, 2 two times daily    labetalol (NORMODYNE) 300 mg, Oral, 2 times daily   losartan-hydrochlorothiazide (HYZAAR) 100-25 MG tablet 1 tablet, Oral, Daily   PARoxetine (PAXIL) 20 mg, Oral, Daily   rosuvastatin (CRESTOR) 20 MG tablet TAKE 1 TABLET EVERY DAY   Vitamin D, Ergocalciferol, (DRISDOL) 1.25 MG (50000 UNIT) CAPS capsule 1 capsule, Oral, Weekly    Radiology: No results found.  Laboratory examination:    Lipid Panel     Component Value Date/Time   CHOL 124 05/27/2021 0926   TRIG 67 05/27/2021 0926   HDL 54 05/27/2021 0926   CHOLHDL 4.8 02/05/2009 0825   VLDL 19 02/05/2009 0825   LDLCALC 56 05/27/2021 0926   LABVLDL 14 05/27/2021 0926    BNP (last 3 results) Recent Labs    05/27/21 0926  BNP 76.8    ProBNP (last 3 results) No results for input(s): PROBNP in the last 8760 hours.  External labs:  Labs 10/31/2020:  Hb 11.5/HCT 36.3, platelets 259, normal indicis.  Serum glucose 180 mg, BUN 27, creatinine 1.07, EGFR 61 mL. Sodium 134, potassium 5.0, CMP otherwise normal.  A1c 7.9%. TSH 2.990, normal.  Vitamin D 46.9.  Total cholesterol 177, triglycerides 74, HDL 62, LDL 101.   Lab 04/01/2019: Hb 11.4/HCT 35.7, platelets 272, normal indicis.    Serum glucose 135 mg, BUN 25, creatinine 1.2, EGFR greater than 60 mL, potassium 4.6, CMP otherwise normal.  Total cholesterol 180, triglycerides 91, HDL 55, LDL 107.  Non-HDL  cholesterol 125.  TSH mildly elevated at 3.86.  06/29/2018: Glucose 220, creatinine 1.0, EGFR 58/67, potassium 4.7, CMP otherwise normal.    Cardiac Studies:   Lexiscan Myoview stress test 02/24/2014: 1. Resting EKG NSR. Stress EKG was non diagnostic for ischemia. No ST-T changes of ischemia noted with pharmacologic stress testing.  Stress symptoms included nausea/vomiting and SOB. Stress terminated due to completion of protocol. 2.  Perfusion imaging study demonstrated mild soft tissue attenuation in the anterior and inferior wall. There was no e/o ischemia or scar. The left ventricular systolic function was normal at 77%. This is a low risk study.  PCV ECHOCARDIOGRAM COMPLETE 05/22/2021  Narrative Echocardiogram 05/22/2021: Left ventricle cavity is normal in size. Mild concentric hypertrophy of the left ventricle. Normal global wall motion. Normal LV systolic function with EF 65%. Doppler evidence of grade I (impaired) diastolic dysfunction, normal LAP. Trileaflet aortic valve. Moderate aortic valve leaflet calcification. Moderate aortic stenosis.  Vmax 3.4 m/sec, mean PG 27 mmHg, AVA 1 cm2 by continuity equation. Dimensionless index 0.35. Moderate (Grade II) aortic regurgitation. Mild mitral valve leaflet calcification. Mild mitral valve stenosis. Mean PG 5 mmHg, MVA 2.2 cm2 by PHT method at 70 bpm. No mitral valve regurgitation. No evidence of pulmonary hypertension. Compared to previous study in 2019, mitral stenosis and aortic regurgitation are new. Study in 2019 noted aortic valve mean gradient of 20 mmHg, calculated aortic valve area 1.38 cm.     Carotid artery duplex 05/01/2021: Duplex suggests stenosis in the right internal carotid artery (>=70%). The right PSV internal/common carotid artery ratio of 8.95 is consistent with a stenosis of >70%. Duplex suggests stenosis in the right external carotid artery (<50%). Duplex suggests stenosis in the left external carotid artery  (<50%). Antegrade right vertebral artery flow. Antegrade left vertebral artery flow. Compared to study done on 09/04/2020, right ICA occlusion report is not evident in the study.  Otherwise no significant change.  Consider other imaging modality to confirm stenosis severity. Follow up in six months is appropriate if clinically indicated.  EKG:  EKG 05/16/2021: Normal sinus rhythm at rate of 82 bpm, left atrial enlargement, normal axis.  Right bundle branch block.  No evidence of ischemia. EKG 11/14/2020: Normal sinus rhythm with rate of 78 bpm, left atrial enlargement, normal axis, right bundle branch block.  No evidence of ischemia.  No significant change from 05/10/2020.  Assessment     ICD-10-CM   1. Moderate aortic stenosis  I35.0     2. Asymptomatic stenosis of right carotid artery  I65.21     3. Pure hypercholesterolemia  E78.00       No orders of the defined types were placed in this encounter.  There are no discontinued medications.  No orders of the defined types were placed in this encounter.     Recommendations:   Brandi Peters  is a 70 y.o. with uncontrolled diabetes mellitus without complications, fibromyalgia, morbid obesity, mild to moderate aortic stenosis and mild aortic regurgitation, asymptomatic bilateral carotid artery stenosis, Bradycardia presents here for 6 month office visit for hypertension and carotid stenosis management.  On her last office visit and repeated echocardiogram to evaluate aortic stenosis and aortic regurgitation.  Overall no significant change, very mild progression of aortic regurgitation.  BNP is normal, no clinical evidence of acute decompensated heart failure.  Suspect her dyspnea is related to deconditioning, underlying obesity.  However, she has lost about 20 to 25 pounds in weight since January 2022.  With regard to carotid artery disease, no significant change from previous.  I had added Zetia for hyperlipidemia, she is at goal with  regard to LDL.  Continue same, I will see her back in 1 year or sooner however we will continue to do carotid surveillance in 6 months.    Adrian Prows, MD, [facility] 07/16/2021, 5:43 AM Office: [phone]

## 2021-07-22 DIAGNOSIS — E7800 Pure hypercholesterolemia, unspecified: Secondary | ICD-10-CM | POA: Diagnosis not present

## 2021-07-22 DIAGNOSIS — E1122 Type 2 diabetes mellitus with diabetic chronic kidney disease: Secondary | ICD-10-CM | POA: Diagnosis not present

## 2021-07-22 DIAGNOSIS — M109 Gout, unspecified: Secondary | ICD-10-CM | POA: Diagnosis not present

## 2021-07-22 DIAGNOSIS — E559 Vitamin D deficiency, unspecified: Secondary | ICD-10-CM | POA: Diagnosis not present

## 2021-07-26 DIAGNOSIS — Z23 Encounter for immunization: Secondary | ICD-10-CM | POA: Diagnosis not present

## 2021-08-14 ENCOUNTER — Other Ambulatory Visit: Payer: Self-pay | Admitting: Cardiology

## 2021-08-14 DIAGNOSIS — E7800 Pure hypercholesterolemia, unspecified: Secondary | ICD-10-CM

## 2021-08-21 DIAGNOSIS — Z23 Encounter for immunization: Secondary | ICD-10-CM | POA: Diagnosis not present

## 2021-11-14 ENCOUNTER — Other Ambulatory Visit: Payer: Medicare Other

## 2021-11-28 ENCOUNTER — Other Ambulatory Visit: Payer: Self-pay

## 2021-11-28 ENCOUNTER — Ambulatory Visit: Payer: Medicare Other

## 2021-11-28 DIAGNOSIS — I6521 Occlusion and stenosis of right carotid artery: Secondary | ICD-10-CM | POA: Diagnosis not present

## 2021-12-17 ENCOUNTER — Other Ambulatory Visit: Payer: Medicare Other

## 2021-12-26 ENCOUNTER — Encounter: Payer: Self-pay | Admitting: Cardiology

## 2021-12-26 ENCOUNTER — Ambulatory Visit: Payer: Medicare Other | Admitting: Cardiology

## 2021-12-26 ENCOUNTER — Other Ambulatory Visit: Payer: Self-pay

## 2021-12-26 VITALS — BP 153/67 | HR 88 | Temp 97.6°F | Resp 16 | Ht 63.0 in | Wt 240.2 lb

## 2021-12-26 DIAGNOSIS — I10 Essential (primary) hypertension: Secondary | ICD-10-CM

## 2021-12-26 DIAGNOSIS — E7800 Pure hypercholesterolemia, unspecified: Secondary | ICD-10-CM | POA: Diagnosis not present

## 2021-12-26 DIAGNOSIS — N1831 Chronic kidney disease, stage 3a: Secondary | ICD-10-CM | POA: Diagnosis not present

## 2021-12-26 DIAGNOSIS — I350 Nonrheumatic aortic (valve) stenosis: Secondary | ICD-10-CM

## 2021-12-26 DIAGNOSIS — I129 Hypertensive chronic kidney disease with stage 1 through stage 4 chronic kidney disease, or unspecified chronic kidney disease: Secondary | ICD-10-CM | POA: Diagnosis not present

## 2021-12-26 DIAGNOSIS — I6521 Occlusion and stenosis of right carotid artery: Secondary | ICD-10-CM | POA: Diagnosis not present

## 2021-12-26 DIAGNOSIS — E1122 Type 2 diabetes mellitus with diabetic chronic kidney disease: Secondary | ICD-10-CM

## 2021-12-26 NOTE — Progress Notes (Signed)
Primary Physician/Referring:  Janie Morning, DO  Patient ID: Brandi Peters, female    DOB: [birth year], 71 y.o.   MRN: [id number]  Chief Complaint  Patient presents with   Shortness of Breath   Hyperlipidemia   Carotid    Stenosis f/u    HPI: Brandi Peters  is a 71 y.o. female  with uncontrolled diabetes mellitus has not developed stage IIIa chronic kidney disease, fibromyalgia, morbid obesity, mild to moderate aortic stenosis and mild aortic regurgitation, asymptomatic bilateral carotid artery stenosis,  presents here for 6 month office visit .  Dyspnea has remained stable, no PND or orthopnea.  Denies chest pain or palpitations.  No dizziness or syncope.  Past Medical History:  Diagnosis Date   Diabetes mellitus without complication ([place])    HTN (hypertension) 03/24/2019   Hyperlipemia 03/24/2019   Type 2 diabetes mellitus with complication, without long-term current use of insulin ([place]) 03/24/2019   Social History   Tobacco Use   Smoking status: Former    Packs/day: 1.00    Years: 25.00    Pack years: 25.00    Types: Cigarettes    Quit date: 2015    Years since quitting: 8.1   Smokeless tobacco: Never  Substance Use Topics   Alcohol use: Yes    Comment: on holidays    Marital Status: Married   Review of Systems  Cardiovascular:  Positive for dyspnea on exertion (stable). Negative for chest pain, leg swelling and orthopnea.  Musculoskeletal:  Positive for arthritis.  Gastrointestinal:  Negative for melena.  Objective   Vitals with BMI 12/26/2021 05/16/2021 11/14/2020  Height 5' 3" 5' 3" -  Weight 240 lbs 3 oz 245 lbs -  BMI 19.37 90.24 -  Systolic 097 353 299  Diastolic 67 70 78  Pulse 88 91 79    Blood pressure (!) 153/67, pulse 88, temperature 97.6 F (36.4 C), temperature source Temporal, resp. rate 16, height 5' 3" (1.6 m), weight 240 lb 3.2 oz (109 kg), SpO2 96 %.   Physical Exam Constitutional:      Appearance: She is morbidly obese.  Neck:     Vascular:  No JVD.  Cardiovascular:     Rate and Rhythm: Normal rate and regular rhythm.     Pulses: Intact distal pulses.          Carotid pulses are  on the right side with bruit and  on the left side with bruit.    Heart sounds: Murmur heard.  Harsh midsystolic murmur is present with a grade of 3/6 at the upper right sternal border radiating to the neck.    No gallop.  Pulmonary:     Effort: Pulmonary effort is normal.     Breath sounds: Normal breath sounds.  Abdominal:     General: Bowel sounds are normal.     Palpations: Abdomen is soft.  Musculoskeletal:     Right lower leg: Edema (2+ ankle edema, pitting) present.     Left lower leg: Edema (2+ ankle edema, pitting) present.    Allergies  Allergen Reactions   Amlodipine     Other reaction(s): edema   Metformin Hcl     Other reaction(s): diarrhea with high doses   Pioglitazone     Other reaction(s): edema and SOB     Current Outpatient Medications:    allopurinol (ZYLOPRIM) 100 MG tablet, Take 1 tablet by mouth daily., Disp: , Rfl:    aspirin 81 MG chewable tablet, Chew 81 mg  by mouth daily., Disp: , Rfl:    ezetimibe (ZETIA) 10 MG tablet, TAKE 1 TABLET EVERY DAY AFTER SUPPER (Patient taking differently: Take 10 mg by mouth daily.), Disp: 90 tablet, Rfl: 0   glipiZIDE (GLUCOTROL) 5 MG tablet, Take 5 mg by mouth. 2 two times daily, Disp: , Rfl:    labetalol (NORMODYNE) 300 MG tablet, Take 300 mg by mouth 2 (two) times daily., Disp: , Rfl:    losartan-hydrochlorothiazide (HYZAAR) 100-25 MG tablet, Take 1 tablet by mouth daily., Disp: , Rfl:    metFORMIN (GLUCOPHAGE) 1000 MG tablet, Take 1,000 mg by mouth daily after supper., Disp: , Rfl:    PARoxetine (PAXIL) 20 MG tablet, Take 20 mg by mouth daily., Disp: , Rfl:    rosuvastatin (CRESTOR) 20 MG tablet, TAKE 1 TABLET EVERY DAY, Disp: 90 tablet, Rfl: 1   Vitamin D, Ergocalciferol, (DRISDOL) 1.25 MG (50000 UNIT) CAPS capsule, Take 1 capsule by mouth once a week., Disp: , Rfl:     Radiology: No results found.  Laboratory examination:   External labs:  Labs 06/10/2021:  BUN 20, creatinine 1.18, EGFR 51 mL, potassium 4.6, LFTs normal.  A1c 8.2%.  Hb 11.5/HCT 36.3, platelets 259, normal indicis.  Labs 10/31/2020:  Serum glucose 180 mg, BUN 27, creatinine 1.07, EGFR 61 mL. Sodium 134, potassium 5.0, CMP otherwise normal.  A1c 7.9%. TSH 2.990, normal.  Vitamin D 46.9.  Total cholesterol 177, triglycerides 74, HDL 62, LDL 101. Cardiac Studies:   Lexiscan Myoview stress test 02/24/2014: 1. Resting EKG NSR. Stress EKG was non diagnostic for ischemia. No ST-T changes of ischemia noted with pharmacologic stress testing.  Stress symptoms included nausea/vomiting and SOB. Stress terminated due to completion of protocol. 2. Perfusion imaging study demonstrated mild soft tissue attenuation in the anterior and inferior wall. There was no e/o ischemia or scar. The left ventricular systolic function was normal at 77%. This is a low risk study.  PCV ECHOCARDIOGRAM COMPLETE 05/22/2021  Narrative Echocardiogram 05/22/2021: Left ventricle cavity is normal in size. Mild concentric hypertrophy of the left ventricle. Normal global wall motion. Normal LV systolic function with EF 65%. Doppler evidence of grade I (impaired) diastolic dysfunction, normal LAP. Trileaflet aortic valve. Moderate aortic valve leaflet calcification. Moderate aortic stenosis.  Vmax 3.4 m/sec, mean PG 27 mmHg, AVA 1 cm2 by continuity equation. Dimensionless index 0.35. Moderate (Grade II) aortic regurgitation. Mild mitral valve leaflet calcification. Mild mitral valve stenosis. Mean PG 5 mmHg, MVA 2.2 cm2 by PHT method at 70 bpm. No mitral valve regurgitation. No evidence of pulmonary hypertension. Compared to previous study in 2019, mitral stenosis and aortic regurgitation are new. Study in 2019 noted aortic valve mean gradient of 20 mmHg, calculated aortic valve area 1.38 cm.    Carotid artery duplex  11/28/2021: Duplex suggests stenosis in the right internal carotid artery (>=70%). The right PSV internal/common carotid artery ratio of 5.49 is consistent with a stenosis of >70%. Peak velocity of 362/82 cm/S. Duplex suggests stenosis in the right external carotid artery (>50%). Duplex suggests stenosis in the left internal carotid artery (1-15%). Antegrade right vertebral artery flow. Antegrade left vertebral artery flow. No significant change from 05/01/2021. In view of calcific plaque, stenosis severity may be underestimated. Consider different modality imaging if clinically indicated. Follow up in six months is appropriate if clinically indicated.  EKG:  EKG 05/16/2021: Normal sinus rhythm at rate of 82 bpm, left atrial enlargement, normal axis.  Right bundle branch block.  No evidence of ischemia. EKG  11/14/2020: Normal sinus rhythm with rate of 78 bpm, left atrial enlargement, normal axis, right bundle branch block.  No evidence of ischemia.  No significant change from 05/10/2020.  Assessment     ICD-10-CM   1. Moderate aortic stenosis  I35.0     2. Asymptomatic stenosis of right carotid artery  I65.21 PCV CAROTID DUPLEX (BILATERAL)    3. Essential hypertension  I10     4. Pure hypercholesterolemia  E78.00     5. Type 2 diabetes mellitus with stage 3a chronic kidney disease, without long-term current use of insulin ([facility])  E11.22    N18.31       No orders of the defined types were placed in this encounter.  There are no discontinued medications.  No orders of the defined types were placed in this encounter.     Recommendations:   Brandi Peters  is a 71 y.o. with uncontrolled diabetes mellitus has not developed stage IIIa chronic kidney disease, fibromyalgia, morbid obesity, mild to moderate aortic stenosis and mild aortic regurgitation, asymptomatic bilateral carotid artery stenosis,  presents here for 6 month office visit .  Today she is not in any acute decompensated heart  failure.  Dyspnea on exertion has remained stable although she has got markedly sedentary lifestyle.  Over the past 3 months, she has been extremely busy with taking care of her brother who has been in the hospital and her husband as well.  She has not had any recent labs.  I did review the labs from August 2022, she has developed new onset stage IIIa chronic kidney disease.  I discussed this with the patient.  As her diabetes is still uncontrolled, she may need change in therapy, she will schedule an office visit and follow-up with Dr. Janie Morning her PCP.  Reviewed the results of the carotid artery duplex, overall no change in carotid artery stenosis severity or the peak velocity.  She has very prominent bilateral carotid bruit.  We will continue surveillance.  She did not bring her medications although states that she has been taking all statins and Zetia, she needs lipid profile testing as well.  Goal LDL <70, prefer closer to be 55.  I had a long discussion with her regarding making lifestyle changes, reducing her calorie intake.  40-minute office visit encounter with discussions regarding chronic medical conditions and especially regarding renal issues.  Office visit in 6 months with repeat carotid artery duplex.      Adrian Prows, MD, [facility] 12/26/2021, 2:17 PM Office: [phone]

## 2021-12-31 ENCOUNTER — Telehealth: Payer: Self-pay | Admitting: Cardiology

## 2021-12-31 NOTE — Telephone Encounter (Signed)
Pt req call from MA regarding medications ?

## 2022-01-01 NOTE — Telephone Encounter (Signed)
Called pt, no answer. Left vm requesting call back?

## 2022-01-02 NOTE — Telephone Encounter (Signed)
Called pt, no answer. Left vm requesting call back?

## 2022-01-06 ENCOUNTER — Other Ambulatory Visit: Payer: Self-pay

## 2022-01-06 DIAGNOSIS — E7800 Pure hypercholesterolemia, unspecified: Secondary | ICD-10-CM

## 2022-01-06 MED ORDER — EZETIMIBE 10 MG PO TABS: 10.0000 mg | ORAL_TABLET | Freq: Every day | ORAL | 1 refills | Status: DC

## 2022-01-06 MED ORDER — ROSUVASTATIN CALCIUM 20 MG PO TABS: 20.0000 mg | ORAL_TABLET | Freq: Every day | ORAL | 1 refills | Status: DC

## 2022-01-06 NOTE — Telephone Encounter (Signed)
Patient called back, just needed 2 medication refills. Rosuvastatin and Ezetimibe. Done.

## 2022-01-16 DIAGNOSIS — E559 Vitamin D deficiency, unspecified: Secondary | ICD-10-CM | POA: Diagnosis not present

## 2022-01-16 DIAGNOSIS — E7801 Familial hypercholesterolemia: Secondary | ICD-10-CM | POA: Diagnosis not present

## 2022-01-16 DIAGNOSIS — E1122 Type 2 diabetes mellitus with diabetic chronic kidney disease: Secondary | ICD-10-CM | POA: Diagnosis not present

## 2022-01-16 DIAGNOSIS — M109 Gout, unspecified: Secondary | ICD-10-CM | POA: Diagnosis not present

## 2022-01-23 DIAGNOSIS — I6529 Occlusion and stenosis of unspecified carotid artery: Secondary | ICD-10-CM | POA: Diagnosis not present

## 2022-01-23 DIAGNOSIS — M5136 Other intervertebral disc degeneration, lumbar region: Secondary | ICD-10-CM | POA: Diagnosis not present

## 2022-01-23 DIAGNOSIS — E7800 Pure hypercholesterolemia, unspecified: Secondary | ICD-10-CM | POA: Diagnosis not present

## 2022-01-23 DIAGNOSIS — E1122 Type 2 diabetes mellitus with diabetic chronic kidney disease: Secondary | ICD-10-CM | POA: Diagnosis not present

## 2022-01-23 DIAGNOSIS — I129 Hypertensive chronic kidney disease with stage 1 through stage 4 chronic kidney disease, or unspecified chronic kidney disease: Secondary | ICD-10-CM | POA: Diagnosis not present

## 2022-01-23 DIAGNOSIS — M109 Gout, unspecified: Secondary | ICD-10-CM | POA: Diagnosis not present

## 2022-01-23 DIAGNOSIS — E559 Vitamin D deficiency, unspecified: Secondary | ICD-10-CM | POA: Diagnosis not present

## 2022-01-23 DIAGNOSIS — N1831 Chronic kidney disease, stage 3a: Secondary | ICD-10-CM | POA: Diagnosis not present

## 2022-01-23 DIAGNOSIS — E1159 Type 2 diabetes mellitus with other circulatory complications: Secondary | ICD-10-CM | POA: Diagnosis not present

## 2022-03-03 ENCOUNTER — Other Ambulatory Visit: Payer: Self-pay

## 2022-03-04 NOTE — Patient Outreach (Signed)
Aging Gracefully Program ? ?03/04/2022 ? ?Brandi Peters ?[birth year] ?[id number] ? ? ?[facility] Evaluation Interviewer made contact with patient. Aging Gracefully initial survey completed.  ? ?Interviewer will send referral to RN and OT for follow up. ? ?[name] ?Care Management Assistant  ?[phone] ?

## 2022-03-20 ENCOUNTER — Other Ambulatory Visit: Payer: Self-pay | Admitting: Occupational Therapy

## 2022-03-21 NOTE — Patient Outreach (Signed)
Aging Gracefully Program  OT Initial Visit  03/21/2022  Brandi Peters May 14, 1951 [id number]  Visit:  1- Initial Visit  Start Time:  0100 End Time:  0145 Total Minutes:  4  CCAP: Typical Daily Routine: Typical Daily Routine:: She gets up and makes sure her husband's needs are attended to. She provides S to him for has basic ADLs. She makes all of their meals. She does all the house hold work and runs the errands--her husband sometimes rides along. 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 What Types Of Care Problems Are You Having Throughout The Day?: going up and down steps, getting in and out of tub, getting up and down from toilet, stoop-crouch-kneel, getting in and out of chairs What Kind Of Help Do You Receive?: none Do You Think You Need Other Types Of Help?: yes What Do You Think Would Make Everyday Life Easier For You?: meals on wheels, walk in shower, grab bars in shower, hand held shower, grab bar toilet Patient Reported Equipment: Patient Reported Equipment Currently Used:  (lift chair) Functional Mobility-Stooping, Crouching, Kneeling To Retreive Item: Stooping, Crouching, or Kneeling To Retrieve Item: A Lot Of Difficulty Do You:: No Device/No Assistance Importance Of Learning New Strategies:: Very Much Intervention: Yes Other Comments:: reacher Functional Mobility-Bending From Standing Position To Pick Up Clothing Off The Floor: Bending Over From Standing Position To Pick Up Clothing Off The Floor: A Lot Of Difficulty Do You:: No Device/No Assistance Importance Of Learning New Strategies:: Very Much Other Comments:: fell in yard this week bending over to pick up something out of yard Intervention: Yes Other Comments:: reacher Functional Mobility-Climb 1 Flight Of Stairs: Climb 1 Flight Of Stairs: A Lot Of Difficulty Do You:: No Device/No Assistance Importance Of Learning New Strategies::  (does okay with handrail(s)) Observation: Climb One Flight Of Stairs:  Independent With Pain, Difficulty, Or Use Of Device Functional Mobility-Move In And Out Of Chair: Move In and Out Of A Chair: Moderate Difficulty Do You:: No Device/No Assistance Importance Of Learning New Strategies:: Moderate Other Comments:: has to rock really far forward Observation: Move In And Out Of Chair: Independent With Pain, Difficulty, Or Use Of Device Safety: Moderate/Extreme Risk Efficiency: Somewhat Intervention:  (pt has a lift chair and a raised cushion in the kitchen chair she uses) Functional Mobility-Get On And Off Toilet: Getting Up From The Floor: A Lot Of Difficulty Do You:: No Device/No Assistance Importance Of Learning New Strategies:: Very Much Intervention: Yes Other Comments:: go over getting up from floor  Readiness To Change Score:  Readiness to Change Score: 10  Home Environment Assessment: Outside Home Entry:: Front entry does not have hand rails (2 steps) and Ms. Bagan does not want wooden rails, she would like metal ones. Back entrance the wooden railing needs to be stablized as well as some of the steps and boards on her deck. Bathroom:: Master bath tub does not have grab bars nor a hand held shower and no grab bars at toilet. Guest bath tub does not have grab bars nor hand held shower.   Goals:  Goals Addressed             This Visit's Progress    Patient Stated       Safety with getting groceries in the house (rolling handled cart)     Patient Stated       More safe getting into and out of the house--agreeable to making the back step rail more sturdy as well as  the steps and deck boards. Wants a rail on steps at front but not wooden.     Patient Stated       Safety with getting things off of the ground Management consultant)        Post Clinical Reasoning: Clinician View Of Client Situation:: Ms. Forbess does very well for herself in that she not only has to take care of herself, but also her husband, the house, and the yard. She gets around fairly  well but does struggle getting up and down steps and in/out of their tub/shower combination. Client View Of His/Her Situation:: Ms. Fjelstad feels she does well for herself and also states how hard it is on her to take care of her husband and all the stuff that needs to be done around the house.She would like to know more about fall alert services, caregiver support groups, and meals on wheels (I will pass these questions on to her [facility] RN through the [employer] program) Next Visit Plan:: [place], OTR/L Acute [place] [phone] Office [phone]

## 2022-03-21 NOTE — Patient Instructions (Signed)
Goals Addressed             This Visit's Progress    Patient Stated       Safety with getting groceries in the house (rolling handled cart)     Patient Stated       More safe getting into and out of the house--agreeable to making the back step rail more sturdy as well as the steps and deck boards. Wants a rail on steps at front but not wooden.     Patient Stated       Safety with getting things off of the ground Lexicographer)

## 2022-04-11 ENCOUNTER — Other Ambulatory Visit: Payer: Self-pay

## 2022-04-11 NOTE — Patient Outreach (Addendum)
Aging Gracefully Program  RN Visit  04/11/2022  Brandi Peters Jan 17, 1951 [id number]  Visit:   RN initial home visit  Start Time:   1330 End Time:   1445 Total Minutes:   75  Readiness To Change Score:     Universal RN Interventions: Exercise Review: Yes Medications: Yes Medication Changes: No Mood: Yes Pain: Yes PCP Advocacy/Support: No Fall Prevention: Yes Incontinence: Yes Clinician View Of Client Situation: Home neat and clean. Walking humped over. slow to get up from sofa due to stiffness in joints. Patient knowledagable about her medications and chronic medical issues. Delightful. Client View Of His/Her Situation: Patient reports she gets overwhelmed with all the things than need to get done. 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 Reports this gets her out of the house. Reports she has to manage most things at home.  Patient reports that she takes her medications as prescribed. Reports that she would like to lose weight but feels like she stress eats.  Also wants to work on stiff joints.  Enjoys working with her flowers. Uses a cane when needed.  Healthcare Provider Communication: Did Surveyor, mining With [employer] Provider?: No  Clinician View of Client Situation: Clinician View Of Client Situation: Home neat and clean. Walking humped over. slow to get up from sofa due to stiffness in joints. Patient knowledagable about her medications and chronic medical issues. Delightful. Client's View of His/Her Situation: Client View Of His/Her Situation: Patient reports she gets overwhelmed with all the things than need to get done. 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 Reports this gets her out of the house. Reports she has to manage most things at home.  Patient reports that she takes her medications as prescribed. Reports that she would like to lose weight but feels like  she stress eats.  Also wants to work on stiff joints.  Enjoys working with her flowers. Uses a cane when needed.  Medication Assessment: Do You Have Any Problems Paying For Medications?: No Where Does Client Store Medications?:  (bedroom) Can Client Read Pill Bottles?: Yes Does Client Use A Pillbox?: No Does Anyone Assist Client In Taking Medications?: No Do You Take Vitamin D?: Yes Does Client Have Any Questions Or Concerns About Medictions?: No Is Client Complaining Of Any Symptoms That Could Be Side Effects To Medications?: No Any Possible Changes In Medication Regimen?: No  Outpatient Encounter Medications as of 04/11/2022  Medication Sig   acetaminophen (TYLENOL) 325 MG tablet Take 500 mg by mouth every 6 (six) hours as needed for moderate pain. Takes 2 tablets as needed   allopurinol (ZYLOPRIM) 100 MG tablet Take 1 tablet by mouth daily.   aspirin 81 MG chewable tablet Chew 81 mg by mouth daily.   colchicine 0.6 MG tablet Take 0.6 mg by mouth as needed.   furosemide (LASIX) 40 MG tablet Take 40 mg by mouth as needed for edema.   glipiZIDE (GLUCOTROL) 5 MG tablet Take 10 mg by mouth in the morning and at bedtime. 2 two times daily   labetalol (NORMODYNE) 300 MG tablet Take 300 mg by mouth 2 (two) times daily.   linaclotide (LINZESS) 145 MCG CAPS capsule Take 145 mcg by mouth as needed.   losartan-hydrochlorothiazide (HYZAAR) 100-25 MG tablet Take 1 tablet by mouth daily.   metFORMIN (GLUCOPHAGE) 1000 MG tablet Take 500 mg by mouth in the morning  and at bedtime.   PARoxetine (PAXIL) 20 MG tablet Take 20 mg by mouth daily.   rosuvastatin (CRESTOR) 20 MG tablet Take 20 mg by mouth daily.   Semaglutide,0.25 or 0.5MG /DOS, (OZEMPIC, 0.25 OR 0.5 MG/DOSE,) 2 MG/1.5ML SOPN Inject 0.25 mg into the skin once a week. Sundays   Vitamin D, Ergocalciferol, (DRISDOL) 1.25 MG (50000 UNIT) CAPS capsule Take 1 capsule by mouth once a week. Takes on Tuesdays   ezetimibe (ZETIA) 10 MG tablet Take 1  tablet (10 mg total) by mouth daily. TAKE 1 TABLET EVERY DAY AFTER SUPPER Strength: 10 mg (Patient not taking: Reported on 04/11/2022)   rosuvastatin (CRESTOR) 20 MG tablet Take 1 tablet (20 mg total) by mouth daily. (Patient not taking: Reported on 04/11/2022)   No facility-administered encounter medications on file as of 04/11/2022.    OT Update: pending Community housing [employer] contracts.    Session Summary: Delightful patient who takes care of her husband.  Goals set.    Goals Addressed               This Visit's Progress     Patient Stated (pt-stated)        Goal: Patient would like to lose weight of 10 plus pounds in the next 120 days. Current weight of 247.  04/11/2022   Assessment: Reviewed with patient her concerns. Voiced understanding of difficulty losing weight.  Patient wants to lose 10 pounds.  Intervention:  Encouraged patient to decrease sweets. Reviewed importance of staying active.  Reviewed with patient that I will bring exercise plan for next home visit.  Reviewed how to avoid stress eating by controlling sweets.  Plan: next home visit planned for 05/14/2022  at 10 am  Rowe Pavy RN, BSN, CEN RN Case Production designer, theatre/television/film for [employer]: [phone]

## 2022-04-25 DIAGNOSIS — E1122 Type 2 diabetes mellitus with diabetic chronic kidney disease: Secondary | ICD-10-CM | POA: Diagnosis not present

## 2022-04-25 DIAGNOSIS — I129 Hypertensive chronic kidney disease with stage 1 through stage 4 chronic kidney disease, or unspecified chronic kidney disease: Secondary | ICD-10-CM | POA: Diagnosis not present

## 2022-04-25 DIAGNOSIS — E1159 Type 2 diabetes mellitus with other circulatory complications: Secondary | ICD-10-CM | POA: Diagnosis not present

## 2022-04-25 DIAGNOSIS — N1831 Chronic kidney disease, stage 3a: Secondary | ICD-10-CM | POA: Diagnosis not present

## 2022-04-25 DIAGNOSIS — E7801 Familial hypercholesterolemia: Secondary | ICD-10-CM | POA: Diagnosis not present

## 2022-04-25 DIAGNOSIS — E559 Vitamin D deficiency, unspecified: Secondary | ICD-10-CM | POA: Diagnosis not present

## 2022-05-14 ENCOUNTER — Other Ambulatory Visit: Payer: Self-pay

## 2022-05-14 NOTE — Patient Outreach (Signed)
Aging Gracefully Program  RN Visit  05/14/2022  Brandi Peters Mar 11, 1951 [id number]  Visit:   RN home visit #2  Start Time:   1000 End Time:   1045 Total Minutes:   45  Readiness To Change Score:     Universal RN Interventions: Calendar Distribution: Yes Exercise Review: Yes Medications: Yes Medication Changes: No Mood: Yes Pain: Yes PCP Advocacy/Support: No Fall Prevention: Yes Incontinence: Yes Clinician View Of Client Situation: Home neat and clean. walking well. Client View Of His/Her Situation: Denies any falls. Reports she will see MD on 05/19/2022. no medication changes.  No new issues. Pending contrat signature. Patient in contact with Baldo Ash.  Healthcare Provider Communication: Did Surveyor, mining With [employer] Provider?: No According to Client, Did PCP Report Communication With An Aging Gracefully RN?: No  Clinician View of Client Situation: Clinician View Of Client Situation: Home neat and clean. walking well. Client's View of His/Her Situation: Client View Of His/Her Situation: Denies any falls. Reports she will see MD on 05/19/2022. no medication changes.  No new issues. Pending contrat signature. Patient in contact with Baldo Ash.  Medication Assessment: denies any changes.    OT Update: pending community housing solutions modifications  Session Summary: Patient is doing well. Eager to learn home exercises.    Goals Addressed               This Visit's Progress     RN AGing Gracefully RN (pt-stated)        Goal: Patient would like to lose weight of 10 plus pounds in the next 120 days. Current weight of 247.  04/11/2022   Assessment: Reviewed with patient her concerns. Voiced understanding of difficulty losing weight.  Patient wants to lose 10 pounds.  Intervention:  Encouraged patient to decrease sweets. Reviewed importance of staying active.  Reviewed with patient that I will bring exercise plan for next home visit.  Reviewed how to  avoid stress eating by controlling sweets.  Plan: next home visit planned for 05/14/2022  at 10 am  Rowe Pavy RN, BSN, CEN RN Case Manager for Aging [facility]: [phone]   05/14/2022 Assessment: Patient reports weight loss is not going well. Todays  240 pounds.  Reports she is stress eating.  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  Interventions:  Reviewed reason for no weight loss.   Reviewed home exercise plan.  Plan: follow up in a month.   Rowe Pavy RN, BSN, Careers adviser for [employer]: [phone]         Rowe Pavy RN, BSN, Careers adviser for [employer] Mobile: [phone]

## 2022-05-14 NOTE — Patient Instructions (Signed)
Visit Information  Thank you for taking time to visit with me today. Please don't hesitate to contact me if I can be of assistance to you before our next scheduled telephone appointment.  Following are the goals we discussed today:   Goals       Patient Stated      Safety with getting groceries in the house (rolling handled cart)      Patient Stated      More safe getting into and out of the house--agreeable to making the back step rail more sturdy as well as the steps and deck boards. Wants a rail on steps at front but not wooden.      Patient Stated      Safety with getting things off of the ground Lexicographer)      RN AGing Gracefully RN (pt-stated)      Goal: Patient would like to lose weight of 10 plus pounds in the next 120 days. Current weight of 247.  04/11/2022   Assessment: Reviewed with patient her concerns. Voiced understanding of difficulty losing weight.  Patient wants to lose 10 pounds.  Intervention:  Encouraged patient to decrease sweets. Reviewed importance of staying active.  Reviewed with patient that I will bring exercise plan for next home visit.  Reviewed how to avoid stress eating by controlling sweets.  Plan: next home visit planned for 05/14/2022  at 10 am  Rowe Pavy RN, BSN, CEN RN Case Manager for Aging [facility]: [phone]   05/14/2022 Assessment: Patient reports weight loss is not going well. Todays  240 pounds.  Reports she is stress eating.  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  Interventions:  Reviewed reason for no weight loss.   Reviewed home exercise plan.  Plan: follow up in a month.   Rowe Pavy RN, BSN, CEN RN Case Production designer, theatre/television/film for Aging [facility]: [phone]          Our next appointment is  in person  on 06/13/2022 at 10am  Please call the care guide team at [phone] if you need to cancel or reschedule your appointment.   If you are  experiencing a Mental Health or Behavioral Health Crisis or need someone to talk to, please call the Suicide and Crisis Lifeline: 988 call the [place] National Suicide Prevention Lifeline: 419-460-5834 or TTY: 906-823-9372 TTY 438-041-8306) to talk to a trained counselor call 1-800-273-TALK (toll free, 24 hour hotline) call 911   The patient verbalized understanding of instructions, educational materials, and care plan provided today and agreed to receive a mailed copy of patient instructions, educational materials, and care plan.   Rowe Pavy RN, BSN, Careers adviser for [employer] Mobile: [phone]

## 2022-05-19 DIAGNOSIS — M109 Gout, unspecified: Secondary | ICD-10-CM | POA: Diagnosis not present

## 2022-05-19 DIAGNOSIS — E1122 Type 2 diabetes mellitus with diabetic chronic kidney disease: Secondary | ICD-10-CM | POA: Diagnosis not present

## 2022-05-19 DIAGNOSIS — M25561 Pain in right knee: Secondary | ICD-10-CM | POA: Diagnosis not present

## 2022-05-19 DIAGNOSIS — Z0000 Encounter for general adult medical examination without abnormal findings: Secondary | ICD-10-CM | POA: Diagnosis not present

## 2022-05-19 DIAGNOSIS — I129 Hypertensive chronic kidney disease with stage 1 through stage 4 chronic kidney disease, or unspecified chronic kidney disease: Secondary | ICD-10-CM | POA: Diagnosis not present

## 2022-05-19 DIAGNOSIS — I351 Nonrheumatic aortic (valve) insufficiency: Secondary | ICD-10-CM | POA: Diagnosis not present

## 2022-05-19 DIAGNOSIS — I6529 Occlusion and stenosis of unspecified carotid artery: Secondary | ICD-10-CM | POA: Diagnosis not present

## 2022-05-19 DIAGNOSIS — I350 Nonrheumatic aortic (valve) stenosis: Secondary | ICD-10-CM | POA: Diagnosis not present

## 2022-05-19 DIAGNOSIS — N1831 Chronic kidney disease, stage 3a: Secondary | ICD-10-CM | POA: Diagnosis not present

## 2022-05-19 DIAGNOSIS — R011 Cardiac murmur, unspecified: Secondary | ICD-10-CM | POA: Diagnosis not present

## 2022-05-19 DIAGNOSIS — E1159 Type 2 diabetes mellitus with other circulatory complications: Secondary | ICD-10-CM | POA: Diagnosis not present

## 2022-05-22 DIAGNOSIS — M25562 Pain in left knee: Secondary | ICD-10-CM | POA: Diagnosis not present

## 2022-05-22 DIAGNOSIS — M25561 Pain in right knee: Secondary | ICD-10-CM | POA: Diagnosis not present

## 2022-06-02 ENCOUNTER — Other Ambulatory Visit: Payer: Self-pay | Admitting: Occupational Therapy

## 2022-06-03 NOTE — Patient Instructions (Signed)
Safety with getting groceries in the house (rolling handled cart) MET   FUNCTIONAL MOBILITY  Target Problem Area:  Difficulty getting items (ie: groceries) in and out of the house  Why Problem May Occur: Decreased balance, strength, endurance  Target Goal: Independence and ease of getting items from car to house and vice versa   STRATEGIES Saving Your Energy: DO: DON'T:  Use the rolling cart to "carry" items to and from car and/or around in your house Try to carry bags in your hands and cart   Modifying your home environment and making it safe: DO: DON'T:  Provide adequate lighting inside and outside your house Use dim lights or lights that cast a lot of shadows   Simplifying the way you set up tasks or daily routines: DO: DON'T:  Take your time when using the cart Rush during when using the cart   Practice It is important to practice the strategies so we can determine if they will be effective in helping to reach your goal. Follow these specific recommendations: 1. Use the cart when you have a number of items you need to carry items around the house or in and out of your house.  [place] Circle, OTR/L       06/02/2022

## 2022-06-03 NOTE — Patient Outreach (Signed)
Aging Gracefully Program  OT Follow-Up Visit  06/03/2022  Brandi Peters 1951/09/18 [id number]  Visit:  2- Second Visit  Start Time:  2707 End Time:  8675 Total Minutes:  20  Readiness to Change Score :  Readiness to Change Score: 10  Durable Medical Equipment: Durable Medical Equipment: Other (rolling grocery cart) Durable Medical Equipment Distribution Date: 06/02/22    Goals:   Goals Addressed             This Visit's Progress    Patient Stated       Safety with getting groceries in the house (rolling handled cart) MET   FUNCTIONAL MOBILITY  Target Problem Area:  Difficulty getting items (ie: groceries) in and out of the house  Why Problem May Occur: Decreased balance, strength, endurance  Target Goal: Independence and ease of getting items from car to house and vice versa   STRATEGIES Saving Your Energy: DO: DON'T:  Use the rolling cart to "carry" items to and from car and/or around in your house Try to carry bags in your hands and cart  Modifying your home environment and making it safe: DO: DON'T:  Provide adequate lighting inside and outside your house Use dim lights or lights that cast a lot of shadows  Simplifying the way you set up tasks or daily routines: DO: DON'T:  Take your time when using the cart Rush during when using the cart   Practice It is important to practice the strategies so we can determine if they will be effective in helping to Peters your goal. Follow these specific recommendations: 1. Use the cart when you have a number of items you need to carry items around the house or in and out of your house.  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 She continues to take care of her husband, Juanda Crumble. She was happy to get the rolling grocery cart to help her in and outside the house. Client View Of His/Her Situation:: Brandi Peters feels she is doing well. 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

## 2022-06-06 ENCOUNTER — Other Ambulatory Visit: Payer: Self-pay | Admitting: Occupational Therapy

## 2022-06-06 NOTE — Patient Instructions (Signed)
Safety with getting things off of the ground Management consultant) MET   ACTION PLANNING Target Problem Area: Difficulty reaching over head and picking items up off the floor  Why Problem May Occur: Decreased height and decreased balance for bending over  Target Goal: Independence and ease of reaching items that are up high and on the ground   STRATEGIES Modifying your home environment and making it safe: DO: DON'T:  Use reacher to get items that are up high and light weight Stand on a step stool  Use reacher to pick up light weight items off the floor Pick up too heavy objects with reacher--may break it. Don't bend over to pick items up off the floor   Practice It is important to practice the strategies so we can determine if they will be effective in helping to reach your goal. Follow these specific recommendations: 1. Use reacher consistently to make sure it is working for you properly and as you need it to.  If a strategy does not work the first time, try it again and again (and maybe again). We may make some changes over the next few sessions, based on how they work.   Golden Circle, OTR/L      06/06/2022

## 2022-06-06 NOTE — Patient Outreach (Signed)
Aging Gracefully Program  OT Follow-Up Visit  06/06/2022  Brandi Peters 12/08/1950 [id number]  Visit:  3- Third Visit  Start Time:  7092 End Time:  9574 Total Minutes:  15  Readiness to Change Score :  Readiness to Change Score: 10  Durable Medical Equipment: Adaptive Equipment: Reacher Adaptive Equipment Distribution Date: 06/06/22 (should arrive at her house today via mail)   Goals:   Goals Addressed             This Visit's Progress    Patient Stated       Safety with getting things off of the ground (reacher) MET   ACTION PLANNING Target Problem Area: Difficulty reaching over head and picking items up off the floor  Why Problem May Occur: Decreased height and decreased balance for bending over  Target Goal: Independence and ease of reaching items that are up high and on the ground   STRATEGIES Modifying your home environment and making it safe: DO: DON'T:  Use reacher to get items that are up high and light weight Stand on a step stool  Use reacher to pick up light weight items off the floor Pick up too heavy objects with reacher--may break it. Don't bend over to pick items up off the floor  Practice It is important to practice the strategies so we can determine if they will be effective in helping to reach your goal. Follow these specific recommendations: 1. Use reacher consistently to make sure it is working for you properly and as you need it to.  If a strategy does not work the first time, try it again and again (and maybe again). We may make some changes over the next few sessions, based on how they work.   Golden Circle, OTR/L      06/06/2022        Post Clinical Reasoning: Client Action (Goal) Three Interventions: Reacher was ordered and was to be delivered to pt's house today. It had not arrived as of yet when I arrived for our appointment. I will follow up with client next week to make sure she got it. Did Client Try?: No Reason Client Did  Not Try?: Other (reacher had not arrived a pt's house today as of yet.) Clinician View Of Client Situation:: Ms. Rooks is happy to be getting a reacher. I showed the reacher to her online and the video on line that showed how it works. Client View Of His/Her Situation:: She continues to do well. Has been using her rolling cart. Looks forward to getting her reacher. Next Visit Plan:: look at home modifications, educational handouts.  Golden Circle, OTR/L [facility] [phone] Office [phone]

## 2022-06-13 ENCOUNTER — Other Ambulatory Visit: Payer: Self-pay

## 2022-06-13 NOTE — Patient Outreach (Signed)
Aging Gracefully Program  RN Visit  06/13/2022  Brandi Peters 07-27-1951 [id number]  Visit:   RN home visit #3 Start Time:   1000 End Time:   1045 Total Minutes:   45  Readiness To Change Score:     Universal RN Interventions: Calendar Distribution: Yes Exercise Review: Yes Medications: Yes Clinician View Of Client Situation: Home neat and clean. Ambualting better. Walk in shower completed.  Patient appears to be inn good spirits. Client View Of His/Her Situation: Patient reports she is dong well. Reports her husband is better and therefore she is better.  reports she has had billateral injections in her knees which helped some. Reports she will start PT soon.  Reports she has been doing some exercises depending on her pain level.   Reports she a been trying to eat less fast food and more salads.  Healthcare Provider Communication: Did Surveyor, mining With [employer] Provider?: No According to Client, Did PCP Report Communication With An Aging Gracefully RN?: No  Clinician View of Client Situation: Clinician View Of Client Situation: Home neat and clean. Ambualting better. Walk in shower completed.  Patient appears to be inn good spirits. Client's View of His/Her Situation: Client View Of His/Her Situation: Patient reports she is dong well. Reports her husband is better and therefore she is better.  reports she has had billateral injections in her knees which helped some. Reports she will start PT soon.  Reports she has been doing some exercises depending on her pain level.   Reports she a been trying to eat less fast food and more salads.  Medication Assessment: denies any changes    OT Update: pending completion of home modifcatons  Session Summary: Doing well.. Continues to work on weight loss.  Continues to have knee pain. Is looking forward to returning to her work duties 4 hours per day.   Goals Addressed               This Visit's Progress     RN AGing  Gracefully RN (pt-stated)        Goal: Patient would like to lose weight of 10 plus pounds in the next 120 days. Current weight of 247.  04/11/2022   Assessment: Reviewed with patient her concerns. Voiced understanding of difficulty losing weight.  Patient wants to lose 10 pounds.  Intervention:  Encouraged patient to decrease sweets. Reviewed importance of staying active.  Reviewed with patient that I will bring exercise plan for next home visit.  Reviewed how to avoid stress eating by controlling sweets.  Plan: next home visit planned for 05/14/2022  at 10 am  Rowe Pavy RN, BSN, CEN RN Case Manager for Aging Gracefully [facility] Mobile: [phone]   05/14/2022 Assessment: Patient reports weight loss is not going well. Todays  240 pounds.  Reports she is stress eating.  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  Interventions:  Reviewed reason for no weight loss.   Reviewed home exercise plan.  Plan: follow up in a month.   Rowe Pavy RN, BSN, CEN RN Case Production designer, theatre/television/film for [employer] [facility] Mobile: [phone]   06/13/2022 Assessment:  Reports she has been not been dong all her exercises due to knee pain.  Today's weight 237 pounds. Reports trying eat more salads.  Reports she has cut back on fast foods.   Interventions: reviewed importance of completing home exercises.  Plan: follow up in 1 month.  Rowe Pavy,  RN, BSN, CEN [facility] Community Care Coordinator [phone]        Rowe Pavy RN, BSN, CEN RN Case Production designer, theatre/television/film for [employer] Mobile: [phone]

## 2022-06-13 NOTE — Patient Instructions (Signed)
Visit Information  Thank you for taking time to visit with me today. Please don't hesitate to contact me if I can be of assistance to you before our next scheduled telephone appointment.  Following are the goals we discussed today:   Goals Addressed               This Visit's Progress     RN AGing Gracefully RN (pt-stated)        Goal: Patient would like to lose weight of 10 plus pounds in the next 120 days. Current weight of 247.  04/11/2022   Assessment: Reviewed with patient her concerns. Voiced understanding of difficulty losing weight.  Patient wants to lose 10 pounds.  Intervention:  Encouraged patient to decrease sweets. Reviewed importance of staying active.  Reviewed with patient that I will bring exercise plan for next home visit.  Reviewed how to avoid stress eating by controlling sweets.  Plan: next home visit planned for 05/14/2022  at 10 am  Rowe Pavy RN, BSN, CEN RN Case Manager for Aging Gracefully [facility] Mobile: [phone]   05/14/2022 Assessment: Patient reports weight loss is not going well. Todays  240 pounds.  Reports she is stress eating.  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  Interventions:  Reviewed reason for no weight loss.   Reviewed home exercise plan.  Plan: follow up in a month.   Rowe Pavy RN, BSN, CEN RN Case Production designer, theatre/television/film for [employer] [facility] Mobile: [phone]   06/13/2022 Assessment:  Reports she has been not been dong all her exercises due to knee pain.  Today's weight 237 pounds. Reports trying eat more salads.  Reports she has cut back on fast foods.   Interventions: reviewed importance of completing home exercises.  Plan: follow up in 1 month.  Rowe Pavy, RN, BSN, CEN [facility] Community Care Coordinator [phone]          Our next appointment is  in person  on 07/01/2022 at 1pm  Please call the care guide team at [phone] if you need to cancel or  reschedule your appointment.   If you are experiencing a Mental Health or Behavioral Health Crisis or need someone to talk to, please call the Suicide and Crisis Lifeline: 988 call the [place] National Suicide Prevention Lifeline: (984) 580-1593 or TTY: 769-290-7139 TTY 509-685-8464) to talk to a trained counselor call 1-800-273-TALK (toll free, 24 hour hotline) go to [facility] [address] [place] [phone]) call 911   The patient verbalized understanding of instructions, educational materials, and care plan provided today and agreed to receive a mailed copy of patient instructions, educational materials, and care plan.   Rowe Pavy RN, BSN, Careers adviser for [employer] Mobile: [phone]

## 2022-06-17 ENCOUNTER — Other Ambulatory Visit: Payer: Medicare Other

## 2022-06-19 ENCOUNTER — Other Ambulatory Visit: Payer: Medicare Other

## 2022-06-20 ENCOUNTER — Other Ambulatory Visit: Payer: Self-pay | Admitting: Family Medicine

## 2022-06-20 DIAGNOSIS — Z1231 Encounter for screening mammogram for malignant neoplasm of breast: Secondary | ICD-10-CM

## 2022-06-26 ENCOUNTER — Ambulatory Visit: Payer: Medicare Other | Admitting: Cardiology

## 2022-07-01 ENCOUNTER — Other Ambulatory Visit: Payer: Self-pay

## 2022-07-01 NOTE — Patient Instructions (Signed)
Visit Information  Thank you for taking time to visit with me today. Please don't hesitate to contact me if I can be of assistance to you before our next scheduled telephone appointment.  Following are the goals we discussed today:   Goals Addressed               This Visit's Progress     RN AGing Gracefully RN (pt-stated)        Goal: Patient would like to lose weight of 10 plus pounds in the next 120 days. Current weight of 247.  04/11/2022   Assessment: Reviewed with patient her concerns. Voiced understanding of difficulty losing weight.  Patient wants to lose 10 pounds.  Intervention:  Encouraged patient to decrease sweets. Reviewed importance of staying active.  Reviewed with patient that I will bring exercise plan for next home visit.  Reviewed how to avoid stress eating by controlling sweets.  Plan: next home visit planned for 05/14/2022  at 10 am  Rowe Pavy RN, BSN, CEN RN Case Manager for Aging Gracefully [facility] Mobile: [phone]   05/14/2022 Assessment: Patient reports weight loss is not going well. Todays  240 pounds.  Reports she is stress eating.  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  Interventions:  Reviewed reason for no weight loss.   Reviewed home exercise plan.  Plan: follow up in a month.   Rowe Pavy RN, BSN, CEN RN Case Production designer, theatre/television/film for [employer] [facility] Mobile: [phone]   06/13/2022 Assessment:  Reports she has been not been dong all her exercises due to knee pain.  Today's weight 237 pounds. Reports trying eat more salads.  Reports she has cut back on fast foods.   Interventions: reviewed importance of completing home exercises.  Plan: follow up in 1 month.  Rowe Pavy, RN, BSN, CEN [facility] [phone]   07/01/2022 Assessment: reports she feels like she has not lost any more weight. Eating lunch when I arrived.   Weight today of 240 pounds. Reports  she does her exercises 2 times per week.  Reports she does her leg and ankle pumps daily.   Interventions: reviewed current weight and weight loss strategies.   Plan: Goals stable. RN visits completed. Patient to continue to work on weight loss.  Rowe Pavy RN, BSN, CEN RN Case Production designer, theatre/television/film for Aging Gracefully [facility] Mobile: [phone]           If you are experiencing a Mental Health or Behavioral Health Crisis or need someone to talk to, please call the Suicide and Crisis Lifeline: 988 call the [place] National Suicide Prevention Lifeline: 650-385-9688 or TTY: (769) 329-6929 TTY 956 556 9543) to talk to a trained counselor call 1-800-273-TALK (toll free, 24 hour hotline) go to [facility] [address], [place] [phone]) call 911   The patient verbalized understanding of instructions, educational materials, and care plan provided today and agreed to receive a mailed copy of patient instructions, educational materials, and care plan.   Rowe Pavy RN, BSN, Careers adviser for [employer] Mobile: [phone]

## 2022-07-01 NOTE — Patient Outreach (Signed)
Aging Gracefully Program  RN Visit  07/01/2022  Brandi Peters Dec 25, 1950 [id number]  Visit:   RN HOME VISIT #4  Start Time:   1300 End Time:   1400 Total Minutes:  60   Readiness To Change Score:     Universal RN Interventions: Calendar Distribution: Yes Exercise Review: Yes Medications: Yes Medication Changes: No Mood: Yes Pain: Yes PCP Advocacy/Support: No Fall Prevention: Yes Incontinence: Yes Clinician View Of Client Situation: Home neat and clean. Ambulating some better.  in Good spirits. 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 Client View Of His/Her Situation: Patient reports that she is doing well. Reports her knee injections did not help much.  Planning to start PT.  doing some of her exercises.  Healthcare Provider Communication: Did Surveyor, mining With [employer] Provider?: No According to Client, Did PCP Report Communication With An Aging Gracefully RN?: No  Clinician View of Client Situation: Clinician View Of Client Situation: Home neat and clean. Ambulating some better.  in Good spirits. 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 Client's View of His/Her Situation: Client View Of His/Her Situation: Patient reports that she is doing well. Reports her knee injections did not help much.  Planning to start PT.  doing some of her exercises.  Medication Assessment: Denies changes to medications    OT Update: home modifications completed  Session Summary: doing well. Continues to work on her goals of weight loss.     Goals Addressed               This Visit's Progress     RN AGing Gracefully RN (pt-stated)        Goal: Patient would like to lose weight of 10 plus pounds in the next 120 days. Current weight of 247.  04/11/2022   Assessment: Reviewed with patient her concerns. Voiced understanding of difficulty losing weight.  Patient wants to lose 10 pounds.  Intervention:  Encouraged patient to decrease sweets. Reviewed importance of staying active.   Reviewed with patient that I will bring exercise plan for next home visit.  Reviewed how to avoid stress eating by controlling sweets.  Plan: next home visit planned for 05/14/2022  at 10 am  Rowe Pavy RN, BSN, CEN RN Case Manager for Aging Gracefully [facility] Mobile: [phone]   05/14/2022 Assessment: Patient reports weight loss is not going well. Todays  240 pounds.  Reports she is stress eating.  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  Interventions:  Reviewed reason for no weight loss.   Reviewed home exercise plan.  Plan: follow up in a month.   Rowe Pavy RN, BSN, CEN RN Case Production designer, theatre/television/film for [employer] [facility] Mobile: [phone]   06/13/2022 Assessment:  Reports she has been not been dong all her exercises due to knee pain.  Today's weight 237 pounds. Reports trying eat more salads.  Reports she has cut back on fast foods.   Interventions: reviewed importance of completing home exercises.  Plan: follow up in 1 month.  Rowe Pavy, RN, BSN, CEN [facility] [phone]   07/01/2022 Assessment: reports she feels like she has not lost any more weight. Eating lunch when I arrived.   Weight today of 240 pounds. Reports she does her exercises 2 times per week.  Reports she does her leg and ankle pumps daily.   Interventions: reviewed current weight and weight loss strategies.   Plan: Goals stable. RN visits  completed. Patient to continue to work on weight loss.  Rowe Pavy RN, BSN, Careers adviser for [employer] Mobile: [phone]        Rowe Pavy RN, BSN, Careers adviser for [employer] Mobile: [phone]

## 2022-07-10 ENCOUNTER — Ambulatory Visit: Payer: Medicare Other

## 2022-07-16 ENCOUNTER — Ambulatory Visit: Payer: Medicare Other

## 2022-07-16 DIAGNOSIS — I6521 Occlusion and stenosis of right carotid artery: Secondary | ICD-10-CM | POA: Diagnosis not present

## 2022-07-23 ENCOUNTER — Ambulatory Visit
Admission: RE | Admit: 2022-07-23 | Discharge: 2022-07-23 | Disposition: A | Discharge status: Home / Self Care | Payer: Medicare Other | Source: Ambulatory Visit | Attending: Family Medicine | Admitting: Family Medicine

## 2022-07-23 DIAGNOSIS — Z1231 Encounter for screening mammogram for malignant neoplasm of breast: Secondary | ICD-10-CM

## 2022-07-25 ENCOUNTER — Encounter: Payer: Self-pay | Admitting: Cardiology

## 2022-07-25 ENCOUNTER — Ambulatory Visit: Payer: Medicare Other | Admitting: Cardiology

## 2022-07-25 VITALS — BP 147/68 | HR 86 | Temp 97.6°F | Resp 16 | Ht 63.0 in | Wt 245.8 lb

## 2022-07-25 DIAGNOSIS — I6521 Occlusion and stenosis of right carotid artery: Secondary | ICD-10-CM

## 2022-07-25 DIAGNOSIS — I10 Essential (primary) hypertension: Secondary | ICD-10-CM | POA: Diagnosis not present

## 2022-07-25 DIAGNOSIS — E7800 Pure hypercholesterolemia, unspecified: Secondary | ICD-10-CM | POA: Diagnosis not present

## 2022-07-25 MED ORDER — AMLODIPINE BESYLATE 5 MG PO TABS: 5.0000 mg | ORAL_TABLET | Freq: Every day | ORAL | 2 refills | Status: DC

## 2022-07-25 NOTE — Addendum Note (Signed)
Addended by: Kela Millin on: 07/25/2022 06:13 PM   Modules accepted: Orders

## 2022-07-25 NOTE — Progress Notes (Cosign Needed Addendum)
Primary Physician/Referring:  Janie Morning, DO  Patient ID: Brandi Peters, female    DOB: [birth year], 71 y.o.   MRN: [id number]  Chief Complaint  Patient presents with   Hypertension   Hyperlipidemia   carotid artery stenosis   Follow-up    6 month    HPI: Brandi Peters  is a 71 y.o. female  with uncontrolled diabetes mellitus with stage IIIa chronic kidney disease, asymptomatic left carotid artery stenosis, primary hypertension, hyperlipidemia, moderate aortic stenosis and regurgitation, fibromyalgia, morbid obesity  presents here for 6 month office visit .  Dyspnea has remained stable, no PND or orthopnea.  Denies chest pain or palpitations.  No dizziness or syncope. 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 She is planning to make dietary changes to assist with weight loss. Currently she is drinking several zero calorie sodas per day and does not consume much water.  Past Medical History:  Diagnosis Date   Allergy    Anxiety    Arthritis    Diabetes mellitus without complication (HCC)    Fibromyalgia    GERD (gastroesophageal reflux disease)    Gout    Heart murmur    HTN (hypertension) 03/24/2019   Hyperlipemia 03/24/2019   Type 2 diabetes mellitus with complication, without long-term current use of insulin ([place]) 03/24/2019   Social History   Tobacco Use   Smoking status: Former    Packs/day: 1.00    Years: 25.00    Total pack years: 25.00    Types: Cigarettes    Quit date: 2015    Years since quitting: 8.7   Smokeless tobacco: Never  Substance Use Topics   Alcohol use: Yes    Comment: on holidays    Marital Status: Married   Review of Systems  Constitutional: Negative for malaise/fatigue.  Cardiovascular:  Positive for dyspnea on exertion (stable). Negative for chest pain, leg swelling and orthopnea.  Musculoskeletal:  Positive for arthritis.  Gastrointestinal:  Negative for melena.   Objective       07/25/2022    1:35 PM 12/26/2021    1:36 PM 05/16/2021    8:34 AM  Vitals with BMI  Height _0  _1  _2   Weight 245 lbs 13 oz 240 lbs 3 oz 245 lbs  BMI 43.55 09.73 53.29  Systolic 924 268 341  Diastolic 68 67 70  Pulse 86 88 91    Blood pressure (!) 147/68, pulse 86, temperature 97.6 F (36.4 C), temperature source Temporal, resp. rate 16, height _3  (1.6 m), weight 245 lb 12.8 oz (111.5 kg), SpO2 95 %.   Physical Exam Constitutional:      Appearance: She is morbidly obese.  Neck:     Vascular: No JVD.  Cardiovascular:     Rate and Rhythm: Normal rate and regular rhythm.     Pulses: Intact distal pulses.          Carotid pulses are  on the right side with bruit and  on the left side with bruit.    Heart sounds: Murmur heard.     Harsh midsystolic murmur is present with a grade of 3/6 at the upper right sternal border radiating to the neck.     No gallop.  Pulmonary:     Effort: Pulmonary effort is normal.     Breath sounds: Normal breath sounds.  Abdominal:     General: Bowel sounds are normal.     Palpations:  Abdomen is soft.  Musculoskeletal:     Right lower leg: 1+ Edema present.     Left lower leg: 1+ Edema present.  Neurological:     Mental Status: She is alert.    Allergies  Allergen Reactions   Metformin Hcl     Other reaction(s): diarrhea with high doses   Pioglitazone     Other reaction(s): edema and SOB     Current Outpatient Medications:    acetaminophen (TYLENOL) 325 MG tablet, Take 500 mg by mouth every 6 (six) hours as needed for moderate pain. Takes 2 tablets as needed, Disp: , Rfl:    allopurinol (ZYLOPRIM) 100 MG tablet, Take 1 tablet by mouth daily., Disp: , Rfl:    amLODipine (NORVASC) 5 MG tablet, Take 1 tablet (5 mg total) by mouth daily., Disp: 30 tablet, Rfl: 2   aspirin 81 MG chewable tablet, Chew 81 mg by mouth daily., Disp: , Rfl:    colchicine 0.6 MG tablet, Take 0.6 mg by mouth as needed., Disp: , Rfl:    ezetimibe (ZETIA) 10 MG  tablet, Take 1 tablet (10 mg total) by mouth daily. TAKE 1 TABLET EVERY DAY AFTER SUPPER Strength: 10 mg, Disp: 90 tablet, Rfl: 1   furosemide (LASIX) 40 MG tablet, Take 40 mg by mouth as needed for edema., Disp: , Rfl:    glipiZIDE (GLUCOTROL) 5 MG tablet, Take 10 mg by mouth in the morning and at bedtime. 2 two times daily, Disp: , Rfl:    labetalol (NORMODYNE) 300 MG tablet, Take 300 mg by mouth 2 (two) times daily., Disp: , Rfl:    linaclotide (LINZESS) 145 MCG CAPS capsule, Take 145 mcg by mouth as needed., Disp: , Rfl:    losartan-hydrochlorothiazide (HYZAAR) 100-25 MG tablet, Take 1 tablet by mouth daily., Disp: , Rfl:    PARoxetine (PAXIL) 20 MG tablet, Take 20 mg by mouth daily., Disp: , Rfl:    rosuvastatin (CRESTOR) 20 MG tablet, Take 1 tablet (20 mg total) by mouth daily., Disp: 90 tablet, Rfl: 1   Semaglutide,0.25 or 0.5MG/DOS, (OZEMPIC, 0.25 OR 0.5 MG/DOSE,) 2 MG/1.5ML SOPN, Inject 0.25 mg into the skin once a week. Sundays, Disp: , Rfl:    Vitamin D, Ergocalciferol, (DRISDOL) 1.25 MG (50000 UNIT) CAPS capsule, Take 1 capsule by mouth once a week. Takes on Tuesdays, Disp: , Rfl:    Radiology: No results found.  Laboratory examination:   External labs:  01/26/2022: Cholesterol 157, triglycerides 87, HDL 59, LDL 81  Hemoglobin 11.2, hematocrit 35, MCV 87.7, platelet 238  Sodium 142, potassium 4.7, BUN 23, creatinine 1.13 EGFR 56  A1c 8.6%.  Vitamin D 44.3. Cardiac Studies:   Lexiscan Myoview stress test 02/24/2014: 1. Resting EKG NSR. Stress EKG was non diagnostic for ischemia. No ST-T changes of ischemia noted with pharmacologic stress testing.  Stress symptoms included nausea/vomiting and SOB. Stress terminated due to completion of protocol. 2. Perfusion imaging study demonstrated mild soft tissue attenuation in the anterior and inferior wall. There was no e/o ischemia or scar. The left ventricular systolic function was normal at 77%. This is a low risk study.  PCV  ECHOCARDIOGRAM COMPLETE 05/22/2021  Narrative Echocardiogram 05/22/2021: Left ventricle cavity is normal in size. Mild concentric hypertrophy of the left ventricle. Normal global wall motion. Normal LV systolic function with EF 65%. Doppler evidence of grade I (impaired) diastolic dysfunction, normal LAP. Trileaflet aortic valve. Moderate aortic valve leaflet calcification. Moderate aortic stenosis.  Vmax 3.4 m/sec, mean PG 27 mmHg,  AVA 1 cm2 by continuity equation. Dimensionless index 0.35. Moderate (Grade II) aortic regurgitation. Mild mitral valve leaflet calcification. Mild mitral valve stenosis. Mean PG 5 mmHg, MVA 2.2 cm2 by PHT method at 70 bpm. No mitral valve regurgitation. No evidence of pulmonary hypertension. Compared to previous study in 2019, mitral stenosis and aortic regurgitation are new. Study in 2019 noted aortic valve mean gradient of 20 mmHg, calculated aortic valve area 1.38 cm.    Carotid artery duplex 07/16/2022:  Duplex suggests stenosis in the right internal carotid artery (50-69%).  Duplex suggests stenosis in the right external carotid artery (>50%).  Duplex suggests stenosis in the left internal carotid artery (1-15%).  Antegrade right vertebral artery flow. Antegrade left vertebral artery  flow.  Compared to the study done on 11/28/2021, stenosis severity on the right ICA  has reduced from >70%.  Previously ICA/CCA ratio was 5.49, presently 3.41.   Otherwise no change in the left carotid artery. Follow up in six months  is appropriate if clinically indicated.   EKG:  07/25/22: Normal sinus rhythm at rate of 72 bpm, left atrial enlargement, normal axis.  Right bundle branch block.  No evidence of ischemia. Compared to previous EKG, no significant change.  Assessment     ICD-10-CM   1. Asymptomatic stenosis of right carotid artery  I65.21 PCV CAROTID DUPLEX (BILATERAL)    2. Primary hypertension  I10 EKG 12-Lead    amLODipine (NORVASC) 5 MG tablet    3. Pure  hypercholesterolemia  E78.00       Meds ordered this encounter  Medications   amLODipine (NORVASC) 5 MG tablet    Sig: Take 1 tablet (5 mg total) by mouth daily.    Dispense:  30 tablet    Refill:  2   Medications Discontinued During This Encounter  Medication Reason   metFORMIN (GLUCOPHAGE) 1000 MG tablet    rosuvastatin (CRESTOR) 20 MG tablet Reorder   rosuvastatin (CRESTOR) 20 MG tablet Reorder   Orders Placed This Encounter  Procedures   EKG 12-Lead       Recommendations:   Brandi Peters  is a 71 y.o. with uncontrolled diabetes mellitus with stage IIIa chronic kidney disease, asymptomatic left carotid artery stenosis, primary hypertension, hyperlipidemia, moderate aortic stenosis and regurgitation, fibromyalgia, morbid obesity  presents here for 6 month office visit .  Essential hypertension Blood pressure is not at goal <140/80, I will start her on amlodipine 5 mg daily, amlodipine was listed as an allergy but on discussion with the patient, she is not aware exactly what it could be, she is willing to try this again.  Suspect she probably has had mild leg edema. Advised her to check blood pressure at home and keep log of readings Discussed lifestyle modifications, weight loss, diet, and exercise Encouraged her to decrease the amount of soda and drink water We discussed exercise such as walking and chair aerobics and she is willing to make changes Recent labs reviewed, creatinine is stable but has increased since previous labs  Asymptomatic stenosis of right carotid artery Reviewed the results of the carotid artery duplex, overall no change in carotid artery stenosis severity or the peak velocity She has very prominent bilateral carotid bruit We will continue surveillance with next duplex in 6 months  Pure hypercholesterolemia Goal LDL <70, prefer closer to be 55 Reviewed recent labs LDL 81 She is compliant with rosuvastatin and zetia Discussed dietary changes and  weight loss  I would like to see her back  in 4 weeks for follow-up of hypertension, will also consider changing her Crestor to 40 mg or trying Lipitor 40 mg daily as she has uncontrolled diabetes mellitus along with stage IIIa chronic kidney disease and carotid disease which all are high risk for future cardiovascular events.   Ernst Spell, AGNP-C 07/25/2022, 6:11 PM Office: [phone]

## 2022-07-28 DIAGNOSIS — E119 Type 2 diabetes mellitus without complications: Secondary | ICD-10-CM | POA: Diagnosis not present

## 2022-07-28 DIAGNOSIS — Z961 Presence of intraocular lens: Secondary | ICD-10-CM | POA: Diagnosis not present

## 2022-08-05 ENCOUNTER — Other Ambulatory Visit: Payer: Self-pay

## 2022-08-05 NOTE — Patient Outreach (Signed)
Aging Gracefully Program  08/05/2022  Brandi Peters 1951/01/06 [id number]   [employer] Evaluation Interviewer made contact with patient. Aging Gracefully 5 month survey completed.    [place] Management Assistant [phone]

## 2022-08-07 DIAGNOSIS — R946 Abnormal results of thyroid function studies: Secondary | ICD-10-CM | POA: Diagnosis not present

## 2022-08-07 DIAGNOSIS — E1122 Type 2 diabetes mellitus with diabetic chronic kidney disease: Secondary | ICD-10-CM | POA: Diagnosis not present

## 2022-08-07 DIAGNOSIS — E1159 Type 2 diabetes mellitus with other circulatory complications: Secondary | ICD-10-CM | POA: Diagnosis not present

## 2022-08-07 DIAGNOSIS — N1831 Chronic kidney disease, stage 3a: Secondary | ICD-10-CM | POA: Diagnosis not present

## 2022-08-07 DIAGNOSIS — I129 Hypertensive chronic kidney disease with stage 1 through stage 4 chronic kidney disease, or unspecified chronic kidney disease: Secondary | ICD-10-CM | POA: Diagnosis not present

## 2022-08-09 ENCOUNTER — Other Ambulatory Visit: Payer: Medicare Other | Admitting: Occupational Therapy

## 2022-08-09 NOTE — Patient Instructions (Addendum)
More safe getting into and out of the house--agreeable to making the back step rail more sturdy as well as the steps and deck boards. Wants a rail on steps at front but not wooden.--MET, pt and husband have been using the back and front entrances as needed depending on they are getting around each day. No rail on front steps due to [facility] only does wooden rails not metal and Ms. Mckissack prefers metal railings on her front steps.   She would like to feel safer with ambulation [facility] will help with this). MET  ACTION PLANNING - FUNCTIONAL MOBILITY  Target Problem Area: Ambulation  Why Problem May Occur: Decreased balance  Target Goal: Increased safety with ambulation   STRATEGIES  Modifying your home environment and making it safe: DO: DON'T:  Use grab bars Hold onto unsafe surfaces   Remove or strongly secure throw rugs    Provide adequate lighting   Use cane in home and when walking outside Carry cane and try to walk   Simplifying the way you set up tasks or daily routines: DO: DON'T:  Move slowly Rush during transfers or walking    Practice It is important to practice the strategies so we can determine if they will be effective in helping to reach your goal. Follow these specific recommendations: 1. Use cane up and down steps if no rails or only one rail 2. Use cane when walking on uneven surfaces  If a strategy does not work the first time, try it again and again (and maybe again). We may make some changes over the next few sessions, based on how they work.   Golden Circle, OTR/L      08/09/2022

## 2022-08-09 NOTE — Patient Outreach (Signed)
Aging Gracefully Program  OT FINAL Visit  08/09/2022  Brandi Peters 1951/06/06 [id number]  Visit:  4- Fourth Visit  Start Time:  1500 End Time:  5974 Total Minutes:  30   Readiness to Change:  Readiness to Change Score: 10  Patient Education: Education Provided: Yes Education Details: Safety at home handout given and gone over, getting up from a fall handout and demonstration by me, booklet on Tips for aging at home provided and discussed. Person(s) Educated: Patient Comprehension: Verbalized Understanding  Goals:  Goals Addressed             This Visit's Progress    COMPLETED: Patient Stated       Safety with getting groceries in the house (rolling handled cart) MET   FUNCTIONAL MOBILITY  Target Problem Area:  Difficulty getting items (ie: groceries) in and out of the house  Why Problem May Occur: Decreased balance, strength, endurance  Target Goal: Independence and ease of getting items from car to house and vice versa   STRATEGIES Saving Your Energy: DO: DON'T:  Use the rolling cart to "carry" items to and from car and/or around in your house Try to carry bags in your hands and cart  Modifying your home environment and making it safe: DO: DON'T:  Provide adequate lighting inside and outside your house Use dim lights or lights that cast a lot of shadows  Simplifying the way you set up tasks or daily routines: DO: DON'T:  Take your time when using the cart Rush during when using the cart   Practice It is important to practice the strategies so we can determine if they will be effective in helping to reach your goal. Follow these specific recommendations: 1. Use the cart when you have a number of items you need to carry items around the house or in and out of your house.  Golden Circle, OTR/L       06/02/2022     COMPLETED: Patient Stated       More safe getting into and out of the house--agreeable to making the back step rail more sturdy as well as the  steps and deck boards. Wants a rail on steps at front but not wooden.--MET, pt and husband have been using the back and front entrances as needed depending on they are getting around each day. No rail on front steps due to [facility] only does wooden rails not metal and Brandi Peters prefers metal railings on her front steps.     COMPLETED: Patient Stated       Safety with getting things off of the ground Management consultant) MET   ACTION PLANNING Target Problem Area: Difficulty reaching over head and picking items up off the floor  Why Problem May Occur: Decreased height and decreased balance for bending over  Target Goal: Independence and ease of reaching items that are up high and on the ground   STRATEGIES Modifying your home environment and making it safe: DO: DON'T:  Use reacher to get items that are up high and light weight Stand on a step stool  Use reacher to pick up light weight items off the floor Pick up too heavy objects with reacher--may break it. Don't bend over to pick items up off the floor  Practice It is important to practice the strategies so we can determine if they will be effective in helping to reach your goal. Follow these specific recommendations: 1. Use reacher consistently to make sure it is working for you  properly and as you need it to.  If a strategy does not work the first time, try it again and again (and maybe again). We may make some changes over the next few sessions, based on how they work.   Golden Circle, OTR/L      06/06/2022     COMPLETED: Patient Stated       She would like to feel safer with ambulation [facility] will help with this). MET  ACTION PLANNING - FUNCTIONAL MOBILITY  Target Problem Area: Ambulation  Why Problem May Occur: Decreased balance  Target Goal: Increased safety with ambulation  STRATEGIES  Modifying your home environment and making it safe: DO: DON'T:  Use grab bars Hold onto unsafe surfaces   Remove or strongly secure throw rugs     Provide adequate lighting   Use cane in home and when walking outside Carry cane and try to walk  Simplifying the way you set up tasks or daily routines: DO: DON'T:  Move slowly Rush during transfers or walking    Practice It is important to practice the strategies so we can determine if they will be effective in helping to reach your goal. Follow these specific recommendations: 1. Use cane up and down steps if no rails or only one rail 2. Use cane when walking on uneven surfaces  If a strategy does not work the first time, try it again and again (and maybe again). We may make some changes over the next few sessions, based on how they work.   Golden Circle, OTR/L      08/09/2022        Post Clinical Reasoning: Client Action (Goal) Four Interventions: Brandi Peters was able to use the [facility] safely for ambulation Did Client Try?: Yes Targeted Problem Area Status: A Lot Better Clinician View Of Client Situation:: Brandi Peters was very thankful for the [facility] as well as what the Aging Gracefully program has done for her and her husband. 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 She has to alot for her husband and she talks about how tiring it can be and is thankful that she can still get out and do things she wants to do for herself. Client View Of His/Her Situation:: Happy school is back in, tired at times from taking care of Brandi Peters still takes and makes time for herself. She plans on getting some PT for herself once all the appointments for her husband settle down. Next Visit Plan:: This was the last visit for Brandi Peters.  Golden Circle, OTR/L Acute Rehab Services Aging Gracefully [phone] Office [phone]

## 2022-09-09 DIAGNOSIS — J069 Acute upper respiratory infection, unspecified: Secondary | ICD-10-CM | POA: Diagnosis not present

## 2023-01-12 DIAGNOSIS — R7309 Other abnormal glucose: Secondary | ICD-10-CM | POA: Diagnosis not present

## 2023-01-12 DIAGNOSIS — Z13220 Encounter for screening for lipoid disorders: Secondary | ICD-10-CM | POA: Diagnosis not present

## 2023-01-12 DIAGNOSIS — R946 Abnormal results of thyroid function studies: Secondary | ICD-10-CM | POA: Diagnosis not present

## 2023-01-12 DIAGNOSIS — Z79899 Other long term (current) drug therapy: Secondary | ICD-10-CM | POA: Diagnosis not present

## 2023-01-20 ENCOUNTER — Other Ambulatory Visit: Payer: Medicare Other

## 2023-01-22 DIAGNOSIS — R011 Cardiac murmur, unspecified: Secondary | ICD-10-CM | POA: Diagnosis not present

## 2023-01-22 DIAGNOSIS — E1122 Type 2 diabetes mellitus with diabetic chronic kidney disease: Secondary | ICD-10-CM | POA: Diagnosis not present

## 2023-01-22 DIAGNOSIS — I129 Hypertensive chronic kidney disease with stage 1 through stage 4 chronic kidney disease, or unspecified chronic kidney disease: Secondary | ICD-10-CM | POA: Diagnosis not present

## 2023-01-22 DIAGNOSIS — I351 Nonrheumatic aortic (valve) insufficiency: Secondary | ICD-10-CM | POA: Diagnosis not present

## 2023-01-22 DIAGNOSIS — I6529 Occlusion and stenosis of unspecified carotid artery: Secondary | ICD-10-CM | POA: Diagnosis not present

## 2023-01-22 DIAGNOSIS — E1159 Type 2 diabetes mellitus with other circulatory complications: Secondary | ICD-10-CM | POA: Diagnosis not present

## 2023-01-22 DIAGNOSIS — N1831 Chronic kidney disease, stage 3a: Secondary | ICD-10-CM | POA: Diagnosis not present

## 2023-01-22 DIAGNOSIS — M109 Gout, unspecified: Secondary | ICD-10-CM | POA: Diagnosis not present

## 2023-01-22 DIAGNOSIS — I350 Nonrheumatic aortic (valve) stenosis: Secondary | ICD-10-CM | POA: Diagnosis not present

## 2023-01-28 ENCOUNTER — Ambulatory Visit: Payer: Medicare Other | Admitting: Cardiology

## 2023-02-03 DIAGNOSIS — M79672 Pain in left foot: Secondary | ICD-10-CM | POA: Diagnosis not present

## 2023-02-03 DIAGNOSIS — H04123 Dry eye syndrome of bilateral lacrimal glands: Secondary | ICD-10-CM | POA: Diagnosis not present

## 2023-02-04 ENCOUNTER — Ambulatory Visit: Payer: Medicare Other

## 2023-02-04 DIAGNOSIS — I6521 Occlusion and stenosis of right carotid artery: Secondary | ICD-10-CM | POA: Diagnosis not present

## 2023-02-09 DIAGNOSIS — M722 Plantar fascial fibromatosis: Secondary | ICD-10-CM | POA: Diagnosis not present

## 2023-02-11 ENCOUNTER — Encounter: Payer: Self-pay | Admitting: Cardiology

## 2023-02-11 ENCOUNTER — Ambulatory Visit: Payer: Medicare Other | Admitting: Cardiology

## 2023-02-11 VITALS — BP 151/67 | HR 83 | Resp 16 | Ht 63.0 in | Wt 244.0 lb

## 2023-02-11 DIAGNOSIS — E1122 Type 2 diabetes mellitus with diabetic chronic kidney disease: Secondary | ICD-10-CM | POA: Diagnosis not present

## 2023-02-11 DIAGNOSIS — I10 Essential (primary) hypertension: Secondary | ICD-10-CM | POA: Diagnosis not present

## 2023-02-11 DIAGNOSIS — I350 Nonrheumatic aortic (valve) stenosis: Secondary | ICD-10-CM

## 2023-02-11 DIAGNOSIS — I6521 Occlusion and stenosis of right carotid artery: Secondary | ICD-10-CM | POA: Diagnosis not present

## 2023-02-11 DIAGNOSIS — E7800 Pure hypercholesterolemia, unspecified: Secondary | ICD-10-CM | POA: Diagnosis not present

## 2023-02-11 DIAGNOSIS — N1831 Chronic kidney disease, stage 3a: Secondary | ICD-10-CM | POA: Diagnosis not present

## 2023-02-11 MED ORDER — CLOPIDOGREL BISULFATE 75 MG PO TABS: 75.0000 mg | ORAL_TABLET | Freq: Every day | ORAL | 1 refills | Status: DC

## 2023-02-11 MED ORDER — EZETIMIBE 10 MG PO TABS: 10.0000 mg | ORAL_TABLET | Freq: Every evening | ORAL | 3 refills | Status: DC

## 2023-02-11 NOTE — Progress Notes (Signed)
Primary Physician/Referring:  Irena Reichmann, DO  Patient ID: Brandi Peters, female    DOB: [birth year], 72 y.o.   MRN: [id number]  No chief complaint on file.   HPI: Brandi Peters  is a 72 y.o. female  with uncontrolled diabetes mellitus with stage IIIa chronic kidney disease, asymptomatic left carotid artery stenosis, primary hypertension, hyperlipidemia, moderate aortic stenosis and regurgitation, fibromyalgia, morbid obesity  presents here for 6 month office visit .  Patient lost her husband 2 months ago, she is grieving her husband's death, states that she has not been taking good care of herself, has been very stressed and has had sleepless nights and depressed.  Otherwise patient states that dyspnea has remained stable, no PND or orthopnea.  Denies chest pain or palpitations.  No dizziness or syncope.   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   Past Medical History:  Diagnosis Date   Allergy    Anxiety    Arthritis    Diabetes mellitus without complication    Fibromyalgia    GERD (gastroesophageal reflux disease)    Gout    Heart murmur    HTN (hypertension) 03/24/2019   Hyperlipemia 03/24/2019   Type 2 diabetes mellitus with complication, without long-term current use of insulin 03/24/2019   Social History   Tobacco Use   Smoking status: Former    Packs/day: 1.00    Years: 25.00    Additional pack years: 0.00    Total pack years: 25.00    Types: Cigarettes    Quit date: 2015    Years since quitting: 9.3   Smokeless tobacco: Never  Substance Use Topics   Alcohol use: Yes    Comment: on holidays    Marital Status: Married   Review of Systems  Cardiovascular:  Positive for dyspnea on exertion (stable). Negative for chest pain, leg swelling and orthopnea.  Musculoskeletal:  Positive for arthritis.  Gastrointestinal:  Negative for melena.   Objective      02/11/2023    2:05 PM 07/25/2022    1:35 PM 12/26/2021     1:36 PM  Vitals with BMI  Height 5\' 3"  5\' 3"  5\' 3"   Weight 244 lbs 245 lbs 13 oz 240 lbs 3 oz  BMI 43.23 43.55 42.56  Systolic 151 147 657  Diastolic 67 68 67  Pulse 83 86 88    Blood pressure (!) 151/67, pulse 83, resp. rate 16, height 5\' 3"  (1.6 m), weight 244 lb (110.7 kg), SpO2 98 %.   Physical Exam Constitutional:      Appearance: She is morbidly obese.  Neck:     Vascular: Carotid bruit (bilateral) present. No JVD.  Cardiovascular:     Rate and Rhythm: Normal rate and regular rhythm.     Pulses: Normal pulses and intact distal pulses.     Heart sounds: Murmur heard.     Harsh midsystolic murmur is present with a grade of 3/6 at the upper right sternal border radiating to the neck.     Blowing early diastolic murmur is present with a grade of 2/4 at the upper right sternal border.     No gallop.  Pulmonary:     Effort: Pulmonary effort is normal.     Breath sounds: Normal breath sounds.  Abdominal:     General: Bowel sounds are normal.     Palpations: Abdomen is soft.  Musculoskeletal:     Right lower leg: 1+ Edema (  trace) present.     Left lower leg: 1+ Edema (trace) present.     Radiology: No results found.  Laboratory examination:   External labs:  Labs 06/08/2022:  Total cholesterol 170, triglycerides 71, HDL 63, LDL 93.  Non-HDL cholesterol 107.  A1c 8.0%.  Sodium 142, potassium 4.1, BUN 20, creatinine 1.10, EGFR 58 mL, LFTs normal.  Hb 11.7/HCT 36.7, platelets 253, normal indicis.  Labs 01/26/2022:  Vitamin D 24.3.  01/26/2022: Cholesterol 157, triglycerides 87, HDL 59, LDL 81  Hemoglobin 11.2, hematocrit 35, MCV 87.7, platelet 238  Sodium 142, potassium 4.7, BUN 23, creatinine 1.13 EGFR 56  A1c 8.6%.  Vitamin D 44.3. Cardiac Studies:   Lexiscan Myoview stress test 02/24/2014: 1. Resting EKG NSR. Stress EKG was non diagnostic for ischemia. No ST-T changes of ischemia noted with pharmacologic stress testing.  Stress symptoms included  nausea/vomiting and SOB. Stress terminated due to completion of protocol. 2. Perfusion imaging study demonstrated mild soft tissue attenuation in the anterior and inferior wall. There was no e/o ischemia or scar. The left ventricular systolic function was normal at 77%. This is a low risk study.  PCV ECHOCARDIOGRAM COMPLETE 05/22/2021  Narrative Echocardiogram 05/22/2021: Left ventricle cavity is normal in size. Mild concentric hypertrophy of the left ventricle. Normal global wall motion. Normal LV systolic function with EF 65%. Doppler evidence of grade I (impaired) diastolic dysfunction, normal LAP. Trileaflet aortic valve. Moderate aortic valve leaflet calcification. Moderate aortic stenosis.  Vmax 3.4 m/sec, mean PG 27 mmHg, AVA 1 cm2 by continuity equation. Dimensionless index 0.35. Moderate (Grade II) aortic regurgitation. Mild mitral valve leaflet calcification. Mild mitral valve stenosis. Mean PG 5 mmHg, MVA 2.2 cm2 by PHT method at 70 bpm. No mitral valve regurgitation. No evidence of pulmonary hypertension. Compared to previous study in 2019, mitral stenosis and aortic regurgitation are new. Study in 2019 noted aortic valve mean gradient of 20 mmHg, calculated aortic valve area 1.38 cm.  Carotid artery duplex 02/04/2023: Duplex suggests stenosis in the right internal carotid artery (80-99%). The right PSV internal/common carotid artery ratio of 9.91 is consistent with a stenosis of >70%. Peak velocity 465/120cm/sec.  Evidence of hemodynamically significant stenosis in the right external carotid artery. Duplex suggests stenosis in the left internal carotid artery (1-15%). Antegrade right vertebral artery flow. Antegrade left vertebral artery flow. Compared to 07/16/2022, right ICA stenosis has progressed from <70%. Follow up in four months is appropriate if clinically indicated.  EKG:  EKG 02/11/2023: Normal sinus rhythm at the rate of 81 bpm, left axis deviation, left intrafascicular  block.  Right bundle branch block.  Bifascicular block.  Compared to 07/25/2022, no significant change.  Allergies and medications:    Allergies  Allergen Reactions   Metformin Hcl     Other reaction(s): diarrhea with high doses   Pioglitazone     Other reaction(s): edema and SOB     Current Outpatient Medications:    acetaminophen (TYLENOL) 325 MG tablet, Take 500 mg by mouth every 6 (six) hours as needed for moderate pain. Takes 2 tablets as needed, Disp: , Rfl:    allopurinol (ZYLOPRIM) 100 MG tablet, Take 1 tablet by mouth daily., Disp: , Rfl:    aspirin 81 MG chewable tablet, Chew 81 mg by mouth daily., Disp: , Rfl:    clopidogrel (PLAVIX) 75 MG tablet, Take 1 tablet (75 mg total) by mouth daily., Disp: 90 tablet, Rfl: 1   colchicine 0.6 MG tablet, Take 0.6 mg by mouth as needed., Disp: ,  Rfl:    ezetimibe (ZETIA) 10 MG tablet, Take 1 tablet (10 mg total) by mouth every evening., Disp: 90 tablet, Rfl: 3   furosemide (LASIX) 40 MG tablet, Take 40 mg by mouth as needed for edema., Disp: , Rfl:    glipiZIDE (GLUCOTROL) 5 MG tablet, Take 10 mg by mouth in the morning and at bedtime. 2 two times daily, Disp: , Rfl:    labetalol (NORMODYNE) 300 MG tablet, Take 300 mg by mouth 2 (two) times daily., Disp: , Rfl:    linaclotide (LINZESS) 145 MCG CAPS capsule, Take 145 mcg by mouth as needed., Disp: , Rfl:    losartan-hydrochlorothiazide (HYZAAR) 100-25 MG tablet, Take 1 tablet by mouth daily., Disp: , Rfl:    PARoxetine (PAXIL) 20 MG tablet, Take 20 mg by mouth daily., Disp: , Rfl:    rosuvastatin (CRESTOR) 20 MG tablet, Take 1 tablet (20 mg total) by mouth daily., Disp: 90 tablet, Rfl: 1   Semaglutide,0.25 or 0.5MG /DOS, (OZEMPIC, 0.25 OR 0.5 MG/DOSE,) 2 MG/1.5ML SOPN, Inject 0.25 mg into the skin once a week. Sundays, Disp: , Rfl:    Assessment     ICD-10-CM   1. Asymptomatic stenosis of right carotid artery  I65.21 EKG 12-Lead    PCV MYOCARDIAL PERFUSION WITH LEXISCAN    Ambulatory  referral to Vascular Surgery    clopidogrel (PLAVIX) 75 MG tablet    2. Primary hypertension  I10     3. Pure hypercholesterolemia  E78.00 ezetimibe (ZETIA) 10 MG tablet    4. Moderate aortic stenosis  I35.0 PCV ECHOCARDIOGRAM COMPLETE    5. Type 2 diabetes mellitus with stage 3a chronic kidney disease, without long-term current use of insulin  E11.22 PCV MYOCARDIAL PERFUSION WITH LEXISCAN   N18.31       Meds ordered this encounter  Medications   ezetimibe (ZETIA) 10 MG tablet    Sig: Take 1 tablet (10 mg total) by mouth every evening.    Dispense:  90 tablet    Refill:  3   clopidogrel (PLAVIX) 75 MG tablet    Sig: Take 1 tablet (75 mg total) by mouth daily.    Dispense:  90 tablet    Refill:  1   Medications Discontinued During This Encounter  Medication Reason   amLODipine (NORVASC) 5 MG tablet Side effect (s)   ezetimibe (ZETIA) 10 MG tablet    Vitamin D, Ergocalciferol, (DRISDOL) 1.25 MG (50000 UNIT) CAPS capsule    Orders Placed This Encounter  Procedures   Ambulatory referral to Vascular Surgery    Referral Priority:   Routine    Referral Type:   Surgical    Referral Reason:   Specialty Services Required    Requested Specialty:   Vascular Surgery    Number of Visits Requested:   1   PCV MYOCARDIAL PERFUSION WITH LEXISCAN    Standing Status:   Future    Standing Expiration Date:   04/13/2023   EKG 12-Lead   PCV ECHOCARDIOGRAM COMPLETE    Standing Status:   Future    Standing Expiration Date:   02/11/2024       Recommendations:   Brandi Peters  is a 72 y.o. with uncontrolled diabetes mellitus with stage IIIa chronic kidney disease, asymptomatic left carotid artery stenosis, primary hypertension, hyperlipidemia, moderate aortic stenosis and regurgitation, fibromyalgia, morbid obesity  presents here for 6 month office visit .  1. Asymptomatic stenosis of right carotid artery I reviewed the results of the carotid artery duplex,  she clearly has marked increase in  velocity in the right ICA suggestive of high-grade stenosis.  Vascular surgical referral made.  For now I will start her on Plavix 75 mg daily in view of the fact that she may be considered a good candidate for TCAR.  This can certainly be stopped if plans change according to vascular surgery. She will continue with aspirin for now 81 mg daily.  Will also schedule her for Lexiscan nuclear stress test for cardiac risk stratification as her last stress test was almost 10 years ago.  - EKG 12-Lead - PCV MYOCARDIAL PERFUSION WITH LEXISCAN; Future - Ambulatory referral to Vascular Surgery - clopidogrel (PLAVIX) 75 MG tablet; Take 1 tablet (75 mg total) by mouth daily.  Dispense: 90 tablet; Refill: 1  2. Primary hypertension Patient's blood pressure is markedly elevated however she was extremely emotional today as her husband recently passed.  Blood pressure has been well-controlled otherwise.  In setting cannot make any changes to her medications.  She is already on losartan HCT 100/25 mg daily, continue the same.  Amlodipine would also be a choice however in view of high-grade stenosis of the right carotid artery, we will have permissive hypertension for now.  3. Pure hypercholesterolemia I reviewed external labs, LDL is increased from >70 to the pleasant 93.  Will add Zetia 10 mg daily.  She will need lipid profile testing in 2 to 3 months.  - ezetimibe (ZETIA) 10 MG tablet; Take 1 tablet (10 mg total) by mouth every evening.  Dispense: 90 tablet; Refill: 3  4. Moderate aortic stenosis Patient's last echocardiogram was >2 years ago, in view of the fact that she needs probably surgical evaluation, we will schedule her for echocardiogram. - PCV ECHOCARDIOGRAM COMPLETE; Future  5. Type 2 diabetes mellitus with stage 3a chronic kidney disease, without long-term current use of insulin Diabetes is also worsened, previously well-controlled.  External labs reviewed, renal function has remained stable.   This is in fact due to patient not paying attention to herself as her husband passed 2 months ago.  She is still grieving very much.  We discussed regarding grieving process and encouraged her to start taking care of herself and her diet. I will see her back in 3 months for follow-up.  - PCV MYOCARDIAL PERFUSION WITH LEXISCAN; Future   Yates Decamp, MD, [facility] 02/11/2023, 10:39 PM Office: [phone] Fax: [phone] Pager: [phone]

## 2023-02-17 ENCOUNTER — Other Ambulatory Visit: Payer: Self-pay

## 2023-02-17 NOTE — Patient Outreach (Signed)
Aging Gracefully Program  02/17/2023  Brandi Peters 07/08/1951 [id number]   [facility] Evaluation Interviewer attempted to call patient on today regarding Aging Gracefully referral. No answer from patient after multiple rings. Unable to leave confidential voicemail for patient to return call.  Will attempt to call back within 1 week.   [name] Care Management Assistant [phone]

## 2023-02-18 ENCOUNTER — Encounter: Payer: Self-pay | Admitting: Vascular Surgery

## 2023-02-18 ENCOUNTER — Ambulatory Visit (INDEPENDENT_AMBULATORY_CARE_PROVIDER_SITE_OTHER): Payer: Medicare Other | Admitting: Vascular Surgery

## 2023-02-18 VITALS — BP 160/81 | HR 75 | Temp 98.0°F | Ht 63.0 in | Wt 241.0 lb

## 2023-02-18 DIAGNOSIS — I6523 Occlusion and stenosis of bilateral carotid arteries: Secondary | ICD-10-CM | POA: Diagnosis not present

## 2023-02-18 NOTE — Progress Notes (Signed)
Patient ID: Brandi Peters, female   DOB: [birth year], 72 y.o.   MRN: [id number]  Reason for Consult: New Patient (Initial Visit)   Referred by Yates Decamp, MD  Subjective:     HPI:  Brandi Peters is a 72 y.o. female history of diabetes, hypertension, hyperlipidemia recently found to have high-grade stenosis of the right ICA.  She denies any history of stroke, TIA or amaurosis.  States that she recently lost her husband of 58 years which has been very depressing for her.  She walks with the help of a cane no previous lower extremity interventions.  She is on aspirin was recently ordered Plavix by Dr. Verl Dicker office.  She also takes Crestor.  She is here today to discuss possible carotid intervention.  Past Medical History:  Diagnosis Date   Allergy    Anxiety    Arthritis    Diabetes mellitus without complication (HCC)    Fibromyalgia    GERD (gastroesophageal reflux disease)    Gout    Heart murmur    HTN (hypertension) 03/24/2019   Hyperlipemia 03/24/2019   Type 2 diabetes mellitus with complication, without long-term current use of insulin (HCC) 03/24/2019   Family History  Problem Relation Age of Onset   Heart disease Mother    Hypertension Mother    Hypertension Father    Hyperlipidemia Father    Heart disease Brother    Hypertension Brother    Hypertension Brother    Past Surgical History:  Procedure Laterality Date   BREAST BIOPSY Left 06/23/2012   BREAST EXCISIONAL BIOPSY Left a long time ago per pt   no visible scar   CHOLECYSTECTOMY     JOINT REPLACEMENT      Short Social History:  Social History   Tobacco Use   Smoking status: Former    Packs/day: 1.00    Years: 25.00    Additional pack years: 0.00    Total pack years: 25.00    Types: Cigarettes    Quit date: 2015    Years since quitting: 9.3   Smokeless tobacco: Never  Substance Use Topics   Alcohol use: Yes    Comment: on holidays     Allergies  Allergen Reactions   Metformin Hcl      Other reaction(s): diarrhea with high doses   Pioglitazone     Other reaction(s): edema and SOB    Current Outpatient Medications  Medication Sig Dispense Refill   acetaminophen (TYLENOL) 325 MG tablet Take 500 mg by mouth every 6 (six) hours as needed for moderate pain. Takes 2 tablets as needed     allopurinol (ZYLOPRIM) 100 MG tablet Take 1 tablet by mouth daily.     aspirin 81 MG chewable tablet Chew 81 mg by mouth daily.     clopidogrel (PLAVIX) 75 MG tablet Take 1 tablet (75 mg total) by mouth daily. 90 tablet 1   colchicine 0.6 MG tablet Take 0.6 mg by mouth as needed.     furosemide (LASIX) 40 MG tablet Take 40 mg by mouth as needed for edema.     glipiZIDE (GLUCOTROL) 5 MG tablet Take 10 mg by mouth in the morning and at bedtime. 2 two times daily     labetalol (NORMODYNE) 300 MG tablet Take 300 mg by mouth 2 (two) times daily.     linaclotide (LINZESS) 145 MCG CAPS capsule Take 145 mcg by mouth as needed.     losartan-hydrochlorothiazide (HYZAAR) 100-25 MG tablet Take 1 tablet  by mouth daily.     PARoxetine (PAXIL) 20 MG tablet Take 20 mg by mouth daily.     rosuvastatin (CRESTOR) 20 MG tablet Take 1 tablet (20 mg total) by mouth daily. 90 tablet 1   Semaglutide,0.25 or 0.5MG /DOS, (OZEMPIC, 0.25 OR 0.5 MG/DOSE,) 2 MG/1.5ML SOPN Inject 0.25 mg into the skin once a week. Sundays     No current facility-administered medications for this visit.    Review of Systems  Constitutional:  Constitutional negative. HENT: HENT negative.  Eyes: Eyes negative.  Respiratory: Respiratory negative.  Cardiovascular: Cardiovascular negative.  GI: Gastrointestinal negative.  Musculoskeletal: Positive for gait problem.  Skin: Skin negative.  Hematologic: Hematologic/lymphatic negative.  Psychiatric: Psychiatric negative.        Objective:  Objective   Vitals:   02/18/23 0905 02/18/23 0908  BP: (!) 148/63 (!) 160/81  Pulse: 75   Temp: 98 F (36.7 C)   TempSrc: Temporal   SpO2: 98%    Weight: 241 lb (109.3 kg)   Height: 5\' 3"  (1.6 m)    Body mass index is 42.69 kg/m.  Physical Exam HENT:     Head: Normocephalic.     Nose: Nose normal.  Eyes:     Pupils: Pupils are equal, round, and reactive to light.  Neck:     Vascular: Carotid bruit present.  Pulmonary:     Effort: Pulmonary effort is normal.  Abdominal:     General: Abdomen is flat.     Palpations: Abdomen is soft.  Musculoskeletal:        General: Normal range of motion.     Right lower leg: Edema present.     Left lower leg: Edema present.  Skin:    General: Skin is warm.     Capillary Refill: Capillary refill takes less than 2 seconds.  Neurological:     Mental Status: She is alert.  Psychiatric:        Mood and Affect: Mood normal.        Thought Content: Thought content normal.     Data: Carotid artery duplex 02/04/2023:  Duplex suggests stenosis in the right internal carotid artery (80-99%).  The right PSV internal/common carotid artery ratio of 9.91 is consistent  with a stenosis of >70%. Peak velocity 465/120cm/sec.  Evidence of hemodynamically significant stenosis in the right external  carotid artery.  Duplex suggests stenosis in the left internal carotid artery (1-15%).  Antegrade right vertebral artery flow. Antegrade left vertebral artery  flow.  Compared to 07/16/2022, right ICA stenosis has progressed from <70%.  Follow up in four months is appropriate if clinically indicated.      Assessment/Plan:    72 year old female found to have high-grade right ICA stenosis with present bruit that is asymptomatic.  I discussed with her the risk of stroke over a several year.  To be about 10% from high-grade carotid stenosis and that we also discussed her options including best medical therapy of which Plavix has been added to her regimen which would include continuing aspirin, Plavix and a statin.  Her other options would be intervention to include either stenting from a transfemoral or  transcarotid route versus carotid endarterectomy.  We discussed possible stenting given that she is already on best medical therapy and we will obtain CT angio of her head and neck and have her follow-up in 3 to 4 weeks.  We discussed the signs and symptoms of stroke for which to seek urgent medical attention and she demonstrates good understanding  of this.     Maeola Harman MD [facility]

## 2023-02-24 ENCOUNTER — Other Ambulatory Visit: Payer: Self-pay

## 2023-02-24 DIAGNOSIS — I6523 Occlusion and stenosis of bilateral carotid arteries: Secondary | ICD-10-CM

## 2023-03-12 DIAGNOSIS — M722 Plantar fascial fibromatosis: Secondary | ICD-10-CM | POA: Diagnosis not present

## 2023-03-17 ENCOUNTER — Ambulatory Visit (HOSPITAL_COMMUNITY): Payer: Medicare Other

## 2023-03-17 ENCOUNTER — Ambulatory Visit (HOSPITAL_COMMUNITY)
Admission: RE | Admit: 2023-03-17 | Discharge: 2023-03-17 | Disposition: A | Discharge status: Home / Self Care | Payer: Medicare Other | Source: Ambulatory Visit | Attending: Vascular Surgery | Admitting: Vascular Surgery

## 2023-03-17 ENCOUNTER — Encounter (HOSPITAL_COMMUNITY): Payer: Self-pay

## 2023-03-17 DIAGNOSIS — I6523 Occlusion and stenosis of bilateral carotid arteries: Secondary | ICD-10-CM | POA: Insufficient documentation

## 2023-03-17 LAB — POCT I-STAT CREATININE: Creatinine, Ser: 1.3 mg/dL — ABNORMAL HIGH (ref 0.44–1.00)

## 2023-03-17 MED ORDER — SODIUM CHLORIDE (PF) 0.9 % IJ SOLN
INTRAMUSCULAR | Status: AC
  Filled 2023-03-17: qty 50

## 2023-03-17 MED ORDER — IOHEXOL 350 MG/ML SOLN
75.0000 mL | Freq: Once | INTRAVENOUS | Status: AC | PRN
  Administered 2023-03-17: 75 mL via INTRAVENOUS

## 2023-03-18 ENCOUNTER — Ambulatory Visit (INDEPENDENT_AMBULATORY_CARE_PROVIDER_SITE_OTHER): Payer: Medicare Other | Admitting: Vascular Surgery

## 2023-03-18 ENCOUNTER — Encounter: Payer: Self-pay | Admitting: Vascular Surgery

## 2023-03-18 VITALS — BP 165/69 | HR 84 | Temp 98.1°F | Resp 20 | Ht 63.0 in | Wt 244.4 lb

## 2023-03-18 DIAGNOSIS — I6523 Occlusion and stenosis of bilateral carotid arteries: Secondary | ICD-10-CM | POA: Diagnosis not present

## 2023-03-18 NOTE — Progress Notes (Signed)
Patient ID: Brandi Peters, female   DOB: [birth year], 72 y.o.   MRN: [id number]  Reason for Consult: Follow-up   Referred by Irena Reichmann, DO  Subjective:     HPI:  Brandi Peters is a 72 y.o. female without history of vascular disease but with history of diabetes, hypertension hyperlipidemia.  She has a known high-grade stenosis of her right internal carotid artery and is here today for follow-up.  She is now taking aspirin, Plavix and a statin.  Denies any history of stroke, TIA or amaurosis.  Past Medical History:  Diagnosis Date   Allergy    Anxiety    Arthritis    Diabetes mellitus without complication (HCC)    Fibromyalgia    GERD (gastroesophageal reflux disease)    Gout    Heart murmur    HTN (hypertension) 03/24/2019   Hyperlipemia 03/24/2019   Type 2 diabetes mellitus with complication, without long-term current use of insulin (HCC) 03/24/2019   Family History  Problem Relation Age of Onset   Heart disease Mother    Hypertension Mother    Hypertension Father    Hyperlipidemia Father    Heart disease Brother    Hypertension Brother    Hypertension Brother    Past Surgical History:  Procedure Laterality Date   BREAST BIOPSY Left 06/23/2012   BREAST EXCISIONAL BIOPSY Left a long time ago per pt   no visible scar   CHOLECYSTECTOMY     JOINT REPLACEMENT      Short Social History:  Social History   Tobacco Use   Smoking status: Former    Packs/day: 1.00    Years: 25.00    Additional pack years: 0.00    Total pack years: 25.00    Types: Cigarettes    Quit date: 2015    Years since quitting: 9.4   Smokeless tobacco: Never  Substance Use Topics   Alcohol use: Yes    Comment: on holidays     Allergies  Allergen Reactions   Metformin Hcl     Other reaction(s): diarrhea with high doses   Pioglitazone     Other reaction(s): edema and SOB    Current Outpatient Medications  Medication Sig Dispense Refill   acetaminophen (TYLENOL) 325 MG tablet  Take 500 mg by mouth every 6 (six) hours as needed for moderate pain. Takes 2 tablets as needed     allopurinol (ZYLOPRIM) 100 MG tablet Take 1 tablet by mouth daily.     aspirin 81 MG chewable tablet Chew 81 mg by mouth daily.     clopidogrel (PLAVIX) 75 MG tablet Take 1 tablet (75 mg total) by mouth daily. 90 tablet 1   colchicine 0.6 MG tablet Take 0.6 mg by mouth as needed.     ezetimibe (ZETIA) 10 MG tablet Take 10 mg by mouth daily.     furosemide (LASIX) 40 MG tablet Take 40 mg by mouth as needed for edema.     glipiZIDE (GLUCOTROL) 5 MG tablet Take 10 mg by mouth in the morning and at bedtime. 2 two times daily     labetalol (NORMODYNE) 300 MG tablet Take 300 mg by mouth 2 (two) times daily.     linaclotide (LINZESS) 145 MCG CAPS capsule Take 145 mcg by mouth as needed.     losartan-hydrochlorothiazide (HYZAAR) 100-25 MG tablet Take 1 tablet by mouth daily.     PARoxetine (PAXIL) 20 MG tablet Take 20 mg by mouth daily.     rosuvastatin (  CRESTOR) 20 MG tablet Take 1 tablet (20 mg total) by mouth daily. 90 tablet 1   Semaglutide,0.25 or 0.5MG /DOS, (OZEMPIC, 0.25 OR 0.5 MG/DOSE,) 2 MG/1.5ML SOPN Inject 0.25 mg into the skin once a week. Sundays     No current facility-administered medications for this visit.    Review of Systems  Constitutional:  Constitutional negative. HENT: HENT negative.  Eyes: Eyes negative.  Respiratory: Respiratory negative.  Cardiovascular: Positive for leg swelling.  GI: Gastrointestinal negative.  Musculoskeletal: Positive for gait problem.  Skin: Skin negative.  Hematologic: Hematologic/lymphatic negative.  Psychiatric: Psychiatric negative.        Objective:  Objective   Vitals:   03/18/23 1413 03/18/23 1416  BP: (!) 178/70 (!) 165/69  Pulse: 84   Resp: 20   Temp: 98.1 F (36.7 C)   SpO2: 94%   Weight: 244 lb 6.4 oz (110.9 kg)   Height: 5\' 3"  (1.6 m)    Body mass index is 43.29 kg/m.  Physical Exam HENT:     Head: Normocephalic.   Neck:     Vascular: Carotid bruit present.  Pulmonary:     Effort: Pulmonary effort is normal.  Abdominal:     General: Abdomen is flat.  Musculoskeletal:     Right lower leg: Edema present.     Left lower leg: Edema present.  Skin:    General: Skin is warm.     Capillary Refill: Capillary refill takes less than 2 seconds.  Neurological:     General: No focal deficit present.     Mental Status: She is alert.  Psychiatric:        Mood and Affect: Mood normal.        Behavior: Behavior normal.        Thought Content: Thought content normal.        Judgment: Judgment normal.     Data: CTA head and neck reviewed with patient, read not available from radiology     Assessment/Plan:    72 year old female with asymptomatic high-grade right ICA stenosis.  We reviewed her imaging today.  She has heavy calcium burden of the right ICA and at the bifurcation which appears subtotally occluded but is known to be patent by duplex.  Plan will be for right carotid endarterectomy after cardiac clearance.  We discussed the risk benefits alternatives including medical therapy versus surgery.  She does not appear to be a candidate for stenting.  She agrees to carotid endarterectomy after cardiac clearance.     Maeola Harman MD [facility]

## 2023-03-19 DIAGNOSIS — M722 Plantar fascial fibromatosis: Secondary | ICD-10-CM | POA: Diagnosis not present

## 2023-03-26 DIAGNOSIS — M722 Plantar fascial fibromatosis: Secondary | ICD-10-CM | POA: Diagnosis not present

## 2023-03-30 ENCOUNTER — Ambulatory Visit: Payer: Medicare Other

## 2023-03-30 DIAGNOSIS — I350 Nonrheumatic aortic (valve) stenosis: Secondary | ICD-10-CM | POA: Diagnosis not present

## 2023-03-30 DIAGNOSIS — I6521 Occlusion and stenosis of right carotid artery: Secondary | ICD-10-CM | POA: Diagnosis not present

## 2023-03-30 DIAGNOSIS — N1831 Chronic kidney disease, stage 3a: Secondary | ICD-10-CM

## 2023-03-30 DIAGNOSIS — I10 Essential (primary) hypertension: Secondary | ICD-10-CM | POA: Diagnosis not present

## 2023-03-30 DIAGNOSIS — E1122 Type 2 diabetes mellitus with diabetic chronic kidney disease: Secondary | ICD-10-CM

## 2023-04-02 ENCOUNTER — Other Ambulatory Visit: Payer: Self-pay

## 2023-04-02 NOTE — Progress Notes (Signed)
Lexiscan Tetrofosmin stress test 03/30/2023: 1 Day Rest/Stress protocol.  Stress EKG is non-diagnostic, as this is pharmacological stress test using Lexiscan. Small sized, mild intensity, subtle reversibility base to mid inferolateral segments ischemia in the LCx distribution cannot be ruled out.  Otherwise, normal myocardial perfusion without prior infarct. Overall accuracy is limited due to soft tissue attenuation on raw cine images, BMI >40, images in a seated position. Calculated LVEF 61%, wall thickness preserved, no obvious regional wall motion abnormality. Prior study dated 02/24/2014 noted normal perfusion with soft tissue attenuation in the anterior and inferior wall. Low risk study.

## 2023-04-02 NOTE — Patient Outreach (Signed)
Aging Gracefully Program  04/02/2023  Brandi Peters [birth year] [id number]   [facility] Evaluation Interviewer attempted to call patient on today regarding Aging Gracefully referral. Patient answered but didn't wish to take evaluation now asked that I call back at a later date.  [facility] Care Management Assistant [phone]

## 2023-04-03 DIAGNOSIS — M722 Plantar fascial fibromatosis: Secondary | ICD-10-CM | POA: Diagnosis not present

## 2023-04-06 DIAGNOSIS — M722 Plantar fascial fibromatosis: Secondary | ICD-10-CM | POA: Diagnosis not present

## 2023-04-13 DIAGNOSIS — M722 Plantar fascial fibromatosis: Secondary | ICD-10-CM | POA: Diagnosis not present

## 2023-04-14 ENCOUNTER — Encounter: Payer: Self-pay | Admitting: Cardiology

## 2023-04-16 ENCOUNTER — Other Ambulatory Visit: Payer: Self-pay

## 2023-04-16 DIAGNOSIS — E1165 Type 2 diabetes mellitus with hyperglycemia: Secondary | ICD-10-CM | POA: Diagnosis not present

## 2023-04-16 DIAGNOSIS — I6523 Occlusion and stenosis of bilateral carotid arteries: Secondary | ICD-10-CM

## 2023-04-22 NOTE — Progress Notes (Signed)
Surgical Instructions    Your procedure is scheduled on Tuesday May 05, 2023.  Report to [facility] Main Entrance "A" at 5:30 A.M., then check in with the Admitting office.  Call this number if you have problems the morning of surgery:  [phone]   If you have any questions prior to your surgery date call [phone]: Open Monday-Friday 8am-4pm If you experience any cold or flu symptoms such as cough, fever, chills, shortness of breath, etc. between now and your scheduled surgery, please notify us at the above number     Remember:  Do not eat or drink after midnight the night before your surgery   Take these medicines the morning of surgery with A SIP OF WATER:  aspirin 81 MG chewable tablet  clopidogrel (PLAVIX)  ezetimibe (ZETIA)  labetalol (NORMODYNE)  PARoxetine (PAXIL)  rosuvastatin (CRESTOR)   As needed: acetaminophen (TYLENOL)  Carboxymethylcellulose Sodium (REFRESH TEARS OP)    As of today, STOP taking any Aleve, Naproxen, Ibuprofen, Motrin, Advil, Goody's, BC's, all herbal medications, fish oil, and all vitamins.   WHAT DO I DO ABOUT MY DIABETES MEDICATION?   Do not take oral diabetes medicines (pills) the morning of surgery.  THE NIGHT BEFORE SURGERY, Do NOT take glipiZIDE (GLUCOTROL).     THE MORNING OF SURGERY, Do NOT take glipiZIDE (GLUCOTROL).          STOP taking your Semaglutide,0.25 or 0.5MG /DOS, (OZEMPIC) 7 days prior to surgery.  Your last dose will be April 22, 2023.    The day of surgery, do not take other diabetes injectables, including Byetta (exenatide), Bydureon (exenatide ER), Victoza (liraglutide), or Trulicity (dulaglutide).  If your CBG is greater than 220 mg/dL, you may take  of your sliding scale (correction) dose of insulin.   HOW TO MANAGE YOUR DIABETES BEFORE AND AFTER SURGERY  Why is it important to control my blood sugar before and after surgery? Improving blood sugar levels before and after surgery helps healing and can  limit problems. A way of improving blood sugar control is eating a healthy diet by:  Eating less sugar and carbohydrates  Increasing activity/exercise  Talking with your doctor about reaching your blood sugar goals High blood sugars (greater than 180 mg/dL) can raise your risk of infections and slow your recovery, so you will need to focus on controlling your diabetes during the weeks before surgery. Make sure that the doctor who takes care of your diabetes knows about your planned surgery including the date and location.  How do I manage my blood sugar before surgery? Check your blood sugar at least 4 times a day, starting 2 days before surgery, to make sure that the level is not too high or low.  Check your blood sugar the morning of your surgery when you wake up and every 2 hours until you get to the Short Stay unit.  If your blood sugar is less than 70 mg/dL, you will need to treat for low blood sugar: Do not take insulin. Treat a low blood sugar (less than 70 mg/dL) with  cup of clear juice (cranberry or apple), 4 glucose tablets, OR glucose gel. Recheck blood sugar in 15 minutes after treatment (to make sure it is greater than 70 mg/dL). If your blood sugar is not greater than 70 mg/dL on recheck, call [phone] for further instructions. Report your blood sugar to the short stay nurse when you get to Short Stay.  If you are admitted to the hospital after surgery: Your blood  sugar will be checked by the staff and you will probably be given insulin after surgery (instead of oral diabetes medicines) to make sure you have good blood sugar levels. The goal for blood sugar control after surgery is 80-180 mg/dL.            Do NOT Smoke (Tobacco/Vaping)  24 hours prior to your procedure  If you use a CPAP at night, you may bring your mask for your overnight stay.   Contacts, glasses, hearing aids, dentures or partials may not be worn into surgery, please bring cases for these  belongings   For patients admitted to the hospital, discharge time will be determined by your treatment team.   Patients discharged the day of surgery will not be allowed to drive home, and someone needs to stay with them for 24 hours.   SURGICAL WAITING ROOM VISITATION Patients having surgery or a procedure may have no more than 2 support people in the waiting area - these visitors may rotate.   Children under the age of 65 must have an adult with them who is not the patient. If the patient needs to stay at the hospital during part of their recovery, the visitor guidelines for inpatient rooms apply. Pre-op nurse will coordinate an appropriate time for 1 support person to accompany patient in pre-op.  This support person may not rotate.   Please refer to [email or web address] for the visitor guidelines for Inpatients (after your surgery is over and you are in a regular room).    Special instructions:    Oral Hygiene is also important to reduce your risk of infection.  Remember - BRUSH YOUR TEETH THE MORNING OF SURGERY WITH YOUR REGULAR TOOTHPASTE   [place]- Preparing For Surgery  Before surgery, you can play an important role. Because skin is not sterile, your skin needs to be as free of germs as possible. You can reduce the number of germs on your skin by washing with CHG (chlorahexidine gluconate) Soap before surgery.  CHG is an antiseptic cleaner which kills germs and bonds with the skin to continue killing germs even after washing.     Please do not use if you have an allergy to CHG or antibacterial soaps. If your skin becomes reddened/irritated stop using the CHG.  Do not shave (including legs and underarms) for at least 48 hours prior to first CHG shower. It is OK to shave your face.  Please follow these instructions carefully.     Shower the NIGHT BEFORE SURGERY and the MORNING OF SURGERY with CHG Soap.   If you chose to wash  your hair, wash your hair first as usual with your normal shampoo. After you shampoo, rinse your hair and body thoroughly to remove the shampoo.  Then [employer] and genitals (private parts) with your normal soap and rinse thoroughly to remove soap.  After that Use CHG Soap as you would any other liquid soap. You can apply CHG directly to the skin and wash gently with a scrungie or a clean washcloth.   Apply the CHG Soap to your body ONLY FROM THE NECK DOWN.  Do not use on open wounds or open sores. Avoid contact with your eyes, ears, mouth and genitals (private parts). Wash Face and genitals (private parts)  with your normal soap.   Wash thoroughly, paying special attention to the area where your surgery will be performed.  Thoroughly rinse your body with warm water from the neck down.  DO NOT shower/wash  with your normal soap after using and rinsing off the CHG Soap.  Pat yourself dry with a CLEAN TOWEL.  Wear CLEAN PAJAMAS to bed the night before surgery  Place CLEAN SHEETS on your bed the night before your surgery  DO NOT SLEEP WITH PETS.   Day of Surgery:  Take a shower with CHG soap. Do not wear jewelry or makeup. Do not wear lotions, powders, perfumes or deodorant. Do not shave 48 hours prior to surgery.   Do not bring valuables to the hospital. Do not wear nail polish, gel polish, artificial nails, or any other type of covering on natural nails (fingers and toes) If you have artificial nails or gel coating that need to be removed by a nail salon, please have this removed prior to surgery. Artificial nails or gel coating may interfere with anesthesia's ability to adequately monitor your vital signs.  [facility] is not responsible for any belongings or valuables.    Wear Clean/Comfortable clothing the morning of surgery  Remember to brush your teeth WITH YOUR REGULAR TOOTHPASTE.   If you received a COVID test during your pre-op visit, it is requested that you wear a mask  when out in public, stay away from anyone that may not be feeling well, and notify your surgeon if you develop symptoms. If you have been in contact with anyone that has tested positive in the last 10 days, please notify your surgeon.    Please read over the following fact sheets that you were given.

## 2023-04-27 ENCOUNTER — Other Ambulatory Visit: Payer: Self-pay

## 2023-04-27 ENCOUNTER — Encounter (HOSPITAL_COMMUNITY): Payer: Self-pay

## 2023-04-27 ENCOUNTER — Encounter (HOSPITAL_COMMUNITY)
Admission: RE | Admit: 2023-04-27 | Discharge: 2023-04-27 | Disposition: A | Discharge status: Home / Self Care | Payer: Medicare Other | Source: Ambulatory Visit | Attending: Vascular Surgery | Admitting: Vascular Surgery

## 2023-04-27 VITALS — BP 152/62 | HR 84 | Temp 98.4°F | Resp 17 | Ht 63.0 in | Wt 239.0 lb

## 2023-04-27 DIAGNOSIS — N183 Chronic kidney disease, stage 3 unspecified: Secondary | ICD-10-CM | POA: Diagnosis not present

## 2023-04-27 DIAGNOSIS — E118 Type 2 diabetes mellitus with unspecified complications: Secondary | ICD-10-CM | POA: Diagnosis not present

## 2023-04-27 DIAGNOSIS — I129 Hypertensive chronic kidney disease with stage 1 through stage 4 chronic kidney disease, or unspecified chronic kidney disease: Secondary | ICD-10-CM | POA: Diagnosis not present

## 2023-04-27 DIAGNOSIS — Z6841 Body Mass Index (BMI) 40.0 and over, adult: Secondary | ICD-10-CM | POA: Insufficient documentation

## 2023-04-27 DIAGNOSIS — M797 Fibromyalgia: Secondary | ICD-10-CM | POA: Insufficient documentation

## 2023-04-27 DIAGNOSIS — E785 Hyperlipidemia, unspecified: Secondary | ICD-10-CM | POA: Diagnosis not present

## 2023-04-27 DIAGNOSIS — R011 Cardiac murmur, unspecified: Secondary | ICD-10-CM | POA: Diagnosis not present

## 2023-04-27 DIAGNOSIS — Z01812 Encounter for preprocedural laboratory examination: Secondary | ICD-10-CM | POA: Insufficient documentation

## 2023-04-27 DIAGNOSIS — I6523 Occlusion and stenosis of bilateral carotid arteries: Secondary | ICD-10-CM | POA: Insufficient documentation

## 2023-04-27 DIAGNOSIS — M109 Gout, unspecified: Secondary | ICD-10-CM | POA: Insufficient documentation

## 2023-04-27 DIAGNOSIS — Z87891 Personal history of nicotine dependence: Secondary | ICD-10-CM | POA: Insufficient documentation

## 2023-04-27 DIAGNOSIS — E669 Obesity, unspecified: Secondary | ICD-10-CM | POA: Diagnosis not present

## 2023-04-27 DIAGNOSIS — E1122 Type 2 diabetes mellitus with diabetic chronic kidney disease: Secondary | ICD-10-CM | POA: Diagnosis not present

## 2023-04-27 DIAGNOSIS — Z01818 Encounter for other preprocedural examination: Secondary | ICD-10-CM

## 2023-04-27 DIAGNOSIS — F419 Anxiety disorder, unspecified: Secondary | ICD-10-CM | POA: Diagnosis not present

## 2023-04-27 LAB — PROTIME-INR
INR: 1.1 (ref 0.8–1.2)
Prothrombin Time: 14.2 seconds (ref 11.4–15.2)

## 2023-04-27 LAB — TYPE AND SCREEN
ABO/RH(D): O POS
Antibody Screen: NEGATIVE

## 2023-04-27 LAB — URINALYSIS, ROUTINE W REFLEX MICROSCOPIC
Bilirubin Urine: NEGATIVE
Glucose, UA: NEGATIVE mg/dL
Hgb urine dipstick: NEGATIVE
Ketones, ur: NEGATIVE mg/dL
Leukocytes,Ua: NEGATIVE
Nitrite: NEGATIVE
Protein, ur: NEGATIVE mg/dL
Specific Gravity, Urine: 1.016 (ref 1.005–1.030)
pH: 5 (ref 5.0–8.0)

## 2023-04-27 LAB — COMPREHENSIVE METABOLIC PANEL
ALT: 21 U/L (ref 0–44)
AST: 21 U/L (ref 15–41)
Albumin: 3.5 g/dL (ref 3.5–5.0)
Alkaline Phosphatase: 117 U/L (ref 38–126)
Anion gap: 10 (ref 5–15)
BUN: 24 mg/dL — ABNORMAL HIGH (ref 8–23)
CO2: 22 mmol/L (ref 22–32)
Calcium: 9.4 mg/dL (ref 8.9–10.3)
Chloride: 105 mmol/L (ref 98–111)
Creatinine, Ser: 1.2 mg/dL — ABNORMAL HIGH (ref 0.44–1.00)
GFR, Estimated: 48 mL/min — ABNORMAL LOW (ref >60)
Glucose, Bld: 249 mg/dL — ABNORMAL HIGH (ref 70–99)
Potassium: 3.8 mmol/L (ref 3.5–5.1)
Sodium: 137 mmol/L (ref 135–145)
Total Bilirubin: 0.3 mg/dL (ref 0.3–1.2)
Total Protein: 7.6 g/dL (ref 6.5–8.1)

## 2023-04-27 LAB — CBC
HCT: 38.5 % (ref 36.0–46.0)
Hemoglobin: 12.4 g/dL (ref 12.0–15.0)
MCH: 29.4 pg (ref 26.0–34.0)
MCHC: 32.2 g/dL (ref 30.0–36.0)
MCV: 91.2 fL (ref 80.0–100.0)
Platelets: 268 10*3/uL (ref 150–400)
RBC: 4.22 MIL/uL (ref 3.87–5.11)
RDW: 13.4 % (ref 11.5–15.5)
WBC: 6.2 10*3/uL (ref 4.0–10.5)
nRBC: 0 % (ref 0.0–0.2)

## 2023-04-27 LAB — GLUCOSE, CAPILLARY: Glucose-Capillary: 258 mg/dL — ABNORMAL HIGH (ref 70–99)

## 2023-04-27 LAB — SURGICAL PCR SCREEN
MRSA, PCR: NEGATIVE
Staphylococcus aureus: NEGATIVE

## 2023-04-27 LAB — APTT: aPTT: 41 seconds — ABNORMAL HIGH (ref 24–36)

## 2023-04-27 NOTE — Progress Notes (Signed)
PCP - Dr. Irena Reichmann  Cardiologist - Dr. Yates Decamp  PPM/ICD - denies Device Orders - n/a Rep Notified - n/a  Chest x-ray - n/a EKG - 02-11-23 Stress Test - 03-30-23 ECHO - 03-30-23 Cardiac Cath - denies  Sleep Study - Denies CPAP - n/a  Fasting Blood Sugar - CBG 258 at PTA appointment patient reports eating peanut butter crackers and did not take medication this am Patient does not check blood sugar at home and does not have a way to check. Patient unsure of last A1C date but A1C drawn today  Last dose of GLP1 agonist-  Ozempic GLP1 instructions: Last dose 04-22-23 patient instructed not to take prior to surgery  Blood Thinner Instructions: Patient to continue to take plavix and asa per Dr. Darcella Cheshire office   ERAS Protcol -NPO order PRE-SURGERY Ensure or G2-   COVID TEST- N/A   Anesthesia review: yes Cardiac history, CBG 258 at PAT appointment  Patient denies shortness of breath, fever, cough and chest pain at PAT appointment   All instructions explained to the patient, with a verbal understanding of the material. Patient agrees to go over the instructions while at home for a better understanding. Patient also instructed to self quarantine after being tested for COVID-19. The opportunity to ask questions was provided.

## 2023-04-27 NOTE — Progress Notes (Addendum)
During PAT appointment, patient stated that she thought she was having surgery on the left side of her neck. Per consent order, posting and Dr. Darcella Cheshire note, surgery is to be done on the right side. Dr. Darcella Cheshire nurse notified. Pt will sign consent on DOS after speaking with Dr. Randie Heinz.   Pt with history of heart murmur- Antionette Poles, PA notified at PAT appointment. Pt with recent cardiac workup.

## 2023-04-28 ENCOUNTER — Encounter (HOSPITAL_COMMUNITY): Payer: Self-pay | Admitting: Vascular Surgery

## 2023-04-28 ENCOUNTER — Encounter (HOSPITAL_COMMUNITY): Payer: Self-pay | Admitting: Certified Registered Nurse Anesthetist

## 2023-04-28 LAB — HEMOGLOBIN A1C
Hgb A1c MFr Bld: 9.9 % — ABNORMAL HIGH (ref 4.8–5.6)
Mean Plasma Glucose: 237 mg/dL

## 2023-04-28 NOTE — Progress Notes (Signed)
Anesthesia Chart Review:  Case: [id number] Date/Time: 05/05/23 0715   Procedure: ENDARTERECTOMY CAROTID (Right)   Anesthesia type: General   Pre-op diagnosis: Bilateral carotid artery stenosis   Location: [facility] ROOM 16 / [facility]   Surgeons: Maeola Harman, MD       DISCUSSION: Patient is a 72 year old female scheduled for the above procedure.  History includes former smoker (quit 10/20/13), HTN, HLD, DM2, murmur (moderate-severe AS, mild AI, mild-moderate MS 03/30/23 echo), carotid artery disease, CKD (stage III), fibromyalgia, gout, anxiety, obesity.   Last visit with cardiologist Dr. Jacinto Halim was on 02/11/23 for follow-up carotid disease, AS, HTN, HLD. He noted that she lost her husband about 2 months prior and was grieving and not taking good care of herself. 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 02/04/23 carotid US showed 80-99% RICA and 1-15% LICA. Plavix was added to her ASA regimen and was referred to vascular surgery. Stress test and echo ordered for preoperative risk stratification. 03/30/23 nuclear stress test was low risk. 03/21/23 echo showed moderate concentric LVH, EF 62%, grade 1 DD, moderately dilated LA, moderate-severe AS (Vmax 3.6 m/sec, mean PG 23 mmHg, AVA 1.2 cm, AVAi 0.5 cm2/m2 by continuity equation, dimensionless index 0.37), mild AI, mild-moderate MR (Mean PG 5 mmHg, MVA 1.6 cm2 by PHT). Dr. Jacinto Halim subsequently classified her at "low risk, from a cardiac standpoint" (see Letters tab).  04/27/23 lab results include Creatinine 1.2, BUN 24, eGFR 48, glucose 249, A1c 9.9%, normal CBC and UA, PT/INR 14.2/1.1, PTT 41. She does not have a way to check home CBGs and was unsure of last A1c results. CBG 258. DM meds include glipizide 20 mg BID, Ozempic 0.5 mg Q Wednesday. Last Ozempic 04/22/23 and to hold until after surgery. A1c results routed to Dr. Randie Heinz and [facility] RN Georgianne Fick.   She will continue ASA and Plavix per [facility].   Anesthesia team to  evaluate on the day of surgery. She will get CBG on arrival.    VS: BP (!) 152/62   Pulse 84   Temp 36.9 C   Resp 17   Ht 5\' 3"  (1.6 m)   Wt 108.4 kg   SpO2 100%   BMI 42.34 kg/m    PROVIDERS: Irena Reichmann, DO is PCP  Yates Decamp, MD is cardiologist   LABS: Preoperative labs noted. See DISCUSSION. (all labs ordered are listed, but only abnormal results are displayed)  Labs Reviewed  GLUCOSE, CAPILLARY - Abnormal; Notable for the following components:      Result Value   Glucose-Capillary 258 (*)    All other components within normal limits  COMPREHENSIVE METABOLIC PANEL - Abnormal; Notable for the following components:   Glucose, Bld 249 (*)    BUN 24 (*)    Creatinine, Ser 1.20 (*)    GFR, Estimated 48 (*)    All other components within normal limits  APTT - Abnormal; Notable for the following components:   aPTT 41 (*)    All other components within normal limits  HEMOGLOBIN A1C - Abnormal; Notable for the following components:   Hgb A1c MFr Bld 9.9 (*)    All other components within normal limits  SURGICAL PCR SCREEN  CBC  PROTIME-INR  URINALYSIS, ROUTINE W REFLEX MICROSCOPIC  TYPE AND SCREEN    IMAGES: CT Head & CTA head/neck 03/17/23: IMPRESSION: 1. Severe stenosis at the right common carotid bifurcation due to bulky calcified plaque. The lumen is unmeasurable given  the degree of stenosis and calcified plaque. 2. Atherosclerosis elsewhere in the head and neck that does not cause flow reducing stenosis. 3. Chronic right parietal infarct which is small.    EKG: EKG 02/11/2023: Normal sinus rhythm at the rate of 81 bpm, left axis deviation, left intrafascicular block.  Right bundle branch block.  Bifascicular block.  Compared to 07/25/2022, no significant change.    CV: Echocardiogram 03/30/2023:  Left ventricle cavity is normal in size. Moderate concentric hypertrophy  of the left ventricle. Normal global wall motion. Normal LV systolic  function with  EF 62%. Doppler evidence of grade I (impaired) diastolic  dysfunction, normal LAP.  Left atrial cavity is moderately dilated at 46.6 ml/m^2.  Trileaflet aortic valve. Moderate aortic valve leaflet calcification.  Moderate to severe aortic stenosis. Vmax 3.6 m/sec, mean PG 23 mmHg, AVA  1.2 cm, AVAi 0.5 cm2/m2 by continuity equation, dimensionless index 0.37.  Mild (Grade I) aortic regurgitation.  Mild calcification of the mitral valve annulus and leaflets. Mild to  moderate mitral stenosis. Mean PG 5 mmHg, MVA 1.6 cm2 by PHT method at HR  71 bpm.  Normal right atrial pressure.  Previous study on 05/22/2021 reported moderate aortic stenosis.  Vmax 3.4  m/sec, mean PG 27 mmHg, AVA 1 cm2 by continuity equation. Dimensionless  index 0.35. Moderate (Grade II) aortic regurgitation. Mild mitral valve  leaflet calcification. Mild mitral valve stenosis. Mean PG 5 mmHg, MVA 2.2  cm2 by PHT method at 70 bpm. No mitral valve regurgitation.    Lexiscan Tetrofosmin stress test 03/30/2023: 1 Day Rest/Stress protocol. Stress EKG is non-diagnostic, as this is pharmacological stress test using Lexiscan. Small sized, mild intensity, subtle reversibility base to mid inferolateral segments ischemia in the LCx distribution cannot be ruled out.  Otherwise, normal myocardial perfusion without prior infarct. Overall accuracy is limited due to soft tissue attenuation on raw cine images, BMI >40, images in a seated position. Calculated LVEF 61%, wall thickness preserved, no obvious regional wall motion abnormality. Prior study dated 02/24/2014 noted normal perfusion with soft tissue attenuation in the anterior and inferior wall. Low risk study.   Carotid artery duplex 02/04/2023:  Duplex suggests stenosis in the right internal carotid artery (80-99%).  The right PSV internal/common carotid artery ratio of 9.91 is consistent  with a stenosis of >70%. Peak velocity 465/120cm/sec.  Evidence of hemodynamically  significant stenosis in the right external  carotid artery.  Duplex suggests stenosis in the left internal carotid artery (1-15%).  Antegrade right vertebral artery flow. Antegrade left vertebral artery  flow.  Compared to 07/16/2022, right ICA stenosis has progressed from <70%.  Follow up in four months is appropriate if clinically indicated.    Past Medical History:  Diagnosis Date   Allergy    Anxiety    Arthritis    Diabetes mellitus without complication (HCC)    Fibromyalgia    GERD (gastroesophageal reflux disease)    Gout    Heart murmur    HTN (hypertension) 03/24/2019   Hyperlipemia 03/24/2019   Type 2 diabetes mellitus with complication, without long-term current use of insulin (HCC) 03/24/2019    Past Surgical History:  Procedure Laterality Date   ABDOMINAL HYSTERECTOMY     BREAST BIOPSY Left 06/23/2012   BREAST EXCISIONAL BIOPSY Left a long time ago per pt   no visible scar   CHOLECYSTECTOMY     JOINT REPLACEMENT Left    shoulder    MEDICATIONS:  acetaminophen (TYLENOL) 500 MG tablet  aspirin 81 MG chewable tablet   Carboxymethylcellulose Sodium (REFRESH TEARS OP)   clopidogrel (PLAVIX) 75 MG tablet   ezetimibe (ZETIA) 10 MG tablet   furosemide (LASIX) 40 MG tablet   glipiZIDE (GLUCOTROL) 10 MG tablet   labetalol (NORMODYNE) 300 MG tablet   linaclotide (LINZESS) 145 MCG CAPS capsule   losartan-hydrochlorothiazide (HYZAAR) 100-25 MG tablet   PARoxetine (PAXIL) 20 MG tablet   rosuvastatin (CRESTOR) 20 MG tablet   Semaglutide,0.25 or 0.5MG /DOS, (OZEMPIC, 0.25 OR 0.5 MG/DOSE,) 2 MG/1.5ML SOPN   Vitamin D, Ergocalciferol, (DRISDOL) 1.25 MG (50000 UNIT) CAPS capsule   No current facility-administered medications for this encounter.

## 2023-04-28 NOTE — Anesthesia Preprocedure Evaluation (Deleted)
Anesthesia Evaluation    Airway        Dental   Pulmonary former smoker          Cardiovascular hypertension, Pt. on medications + Peripheral Vascular Disease    Echocardiogram 03/30/2023:  Left ventricle cavity is normal in size. Moderate concentric hypertrophy  of the left ventricle. Normal global wall motion. Normal LV systolic  function with EF 62%. Doppler evidence of grade I (impaired) diastolic  dysfunction, normal LAP.  Left atrial cavity is moderately dilated at 46.6 ml/m^2.  Trileaflet aortic valve. Moderate aortic valve leaflet calcification.  Moderate to severe aortic stenosis. Vmax 3.6 m/sec, mean PG 23 mmHg, AVA  1.2 cm, AVAi 0.5 cm2/m2 by continuity equation, dimensionless index 0.37.  Mild (Grade I) aortic regurgitation.  Mild calcification of the mitral valve annulus and leaflets. Mild to  moderate mitral stenosis. Mean PG 5 mmHg, MVA 1.6 cm2 by PHT method at HR  71 bpm.  Normal right atrial pressure.  Previous study on 05/22/2021 reported moderate aortic stenosis. Vmax 3.4  m/sec, mean PG 27 mmHg, AVA 1 cm2 by continuity equation. Dimensionless  index 0.35. Moderate (Grade II) aortic regurgitation. Mild mitral valve  leaflet calcification. Mild mitral valve stenosis. Mean PG 5 mmHg, MVA 2.2  cm2 by PHT method at 70 bpm. No mitral valve regurgitation.    Lexiscan Tetrofosmin stress test 03/30/2023: 1 Day Rest/Stress protocol. Stress EKG is non-diagnostic, as this is pharmacological stress test using Lexiscan. Small sized, mild intensity, subtle reversibility base to mid inferolateral segments ischemia in the LCx distribution cannot be ruled out. Otherwise, normal myocardial perfusion without prior infarct. Overall accuracy is limited due to soft tissue attenuation on raw cine images, BMI >40, images in a seated position. Calculated LVEF 61%, wall thickness preserved, no obvious regional wall motion  abnormality. Prior study dated 02/24/2014 noted normal perfusion with soft tissue attenuation in the anterior and inferior wall. Low risk study.     Neuro/Psych   Anxiety        GI/Hepatic ,GERD  Medicated,,  Endo/Other  diabetes, Oral Hypoglycemic Agents  Morbid obesityozempic  Renal/GU Renal InsufficiencyRenal disease     Musculoskeletal   Abdominal   Peds  Hematology   Anesthesia Other Findings   Reproductive/Obstetrics                             Anesthesia Physical Anesthesia Plan  ASA: 4  Anesthesia Plan: General   Post-op Pain Management: Tylenol PO (pre-op)*   Induction: Intravenous  PONV Risk Score and Plan: 3 and Ondansetron, Dexamethasone and Treatment may vary due to age or medical condition  Airway Management Planned: Oral ETT  Additional Equipment: Arterial line  Intra-op Plan:   Post-operative Plan: Extubation in OR  Informed Consent:   Plan Discussed with:   Anesthesia Plan Comments: (PAT note written 04/28/2023 by Shonna Chock, PA-C. 80-99% RICA. Uncontrolled DM2 with A1c 9.9%. Moderate-severe AS, mild AI, mild-moderate MS by 03/2013 echo.   )       Anesthesia Quick Evaluation

## 2023-05-05 ENCOUNTER — Emergency Department (HOSPITAL_COMMUNITY)
Admission: RE | Admit: 2023-05-05 | Discharge: 2023-05-05 | Disposition: A | Discharge status: Home / Self Care | Payer: Medicare Other | Attending: Vascular Surgery | Admitting: Vascular Surgery

## 2023-05-05 ENCOUNTER — Other Ambulatory Visit: Payer: Self-pay

## 2023-05-05 ENCOUNTER — Encounter (HOSPITAL_COMMUNITY): Payer: Self-pay | Admitting: Vascular Surgery

## 2023-05-05 ENCOUNTER — Encounter (HOSPITAL_COMMUNITY)
Admission: RE | Disposition: A | Discharge status: Home / Self Care | Payer: Self-pay | Source: Home / Self Care | Attending: Vascular Surgery

## 2023-05-05 DIAGNOSIS — Z7902 Long term (current) use of antithrombotics/antiplatelets: Secondary | ICD-10-CM | POA: Diagnosis not present

## 2023-05-05 DIAGNOSIS — E1165 Type 2 diabetes mellitus with hyperglycemia: Secondary | ICD-10-CM | POA: Diagnosis not present

## 2023-05-05 DIAGNOSIS — R739 Hyperglycemia, unspecified: Secondary | ICD-10-CM | POA: Diagnosis present

## 2023-05-05 DIAGNOSIS — Z7984 Long term (current) use of oral hypoglycemic drugs: Secondary | ICD-10-CM | POA: Diagnosis not present

## 2023-05-05 DIAGNOSIS — Z7982 Long term (current) use of aspirin: Secondary | ICD-10-CM | POA: Diagnosis not present

## 2023-05-05 LAB — BASIC METABOLIC PANEL
Anion gap: 10 (ref 5–15)
BUN: 16 mg/dL (ref 8–23)
CO2: 25 mmol/L (ref 22–32)
Calcium: 9 mg/dL (ref 8.9–10.3)
Chloride: 102 mmol/L (ref 98–111)
Creatinine, Ser: 1.14 mg/dL — ABNORMAL HIGH (ref 0.44–1.00)
GFR, Estimated: 51 mL/min — ABNORMAL LOW (ref >60)
Glucose, Bld: 359 mg/dL — ABNORMAL HIGH (ref 70–99)
Potassium: 3.9 mmol/L (ref 3.5–5.1)
Sodium: 137 mmol/L (ref 135–145)

## 2023-05-05 LAB — URINALYSIS, ROUTINE W REFLEX MICROSCOPIC
Bacteria, UA: NONE SEEN
Bilirubin Urine: NEGATIVE
Glucose, UA: >=500 mg/dL — AB
Hgb urine dipstick: NEGATIVE
Ketones, ur: NEGATIVE mg/dL
Nitrite: NEGATIVE
Protein, ur: NEGATIVE mg/dL
Specific Gravity, Urine: 1.015 (ref 1.005–1.030)
pH: 5 (ref 5.0–8.0)

## 2023-05-05 LAB — CBC
HCT: 33.7 % — ABNORMAL LOW (ref 36.0–46.0)
Hemoglobin: 10.9 g/dL — ABNORMAL LOW (ref 12.0–15.0)
MCH: 29 pg (ref 26.0–34.0)
MCHC: 32.3 g/dL (ref 30.0–36.0)
MCV: 89.6 fL (ref 80.0–100.0)
Platelets: 236 10*3/uL (ref 150–400)
RBC: 3.76 MIL/uL — ABNORMAL LOW (ref 3.87–5.11)
RDW: 13.3 % (ref 11.5–15.5)
WBC: 6.7 10*3/uL (ref 4.0–10.5)
nRBC: 0 % (ref 0.0–0.2)

## 2023-05-05 LAB — I-STAT CHEM 8, ED
BUN: 24 mg/dL — ABNORMAL HIGH (ref 8–23)
Calcium, Ion: 1.04 mmol/L — ABNORMAL LOW (ref 1.15–1.40)
Chloride: 105 mmol/L (ref 98–111)
Creatinine, Ser: 1.1 mg/dL — ABNORMAL HIGH (ref 0.44–1.00)
Glucose, Bld: 367 mg/dL — ABNORMAL HIGH (ref 70–99)
HCT: 35 % — ABNORMAL LOW (ref 36.0–46.0)
Hemoglobin: 11.9 g/dL — ABNORMAL LOW (ref 12.0–15.0)
Potassium: 6.8 mmol/L (ref 3.5–5.1)
Sodium: 135 mmol/L (ref 135–145)
TCO2: 26 mmol/L (ref 22–32)

## 2023-05-05 LAB — I-STAT VENOUS BLOOD GAS, ED
Acid-Base Excess: 3 mmol/L — ABNORMAL HIGH (ref 0.0–2.0)
Bicarbonate: 25.6 mmol/L (ref 20.0–28.0)
Calcium, Ion: 1.04 mmol/L — ABNORMAL LOW (ref 1.15–1.40)
HCT: 36 % (ref 36.0–46.0)
Hemoglobin: 12.2 g/dL (ref 12.0–15.0)
O2 Saturation: 99 %
Potassium: 6.8 mmol/L (ref 3.5–5.1)
Sodium: 135 mmol/L (ref 135–145)
TCO2: 27 mmol/L (ref 22–32)
pCO2, Ven: 33.8 mmHg — ABNORMAL LOW (ref 44–60)
pH, Ven: 7.487 — ABNORMAL HIGH (ref 7.25–7.43)
pO2, Ven: 127 mmHg — ABNORMAL HIGH (ref 32–45)

## 2023-05-05 LAB — GLUCOSE, CAPILLARY
Glucose-Capillary: >600 mg/dL (ref 70–99)
Glucose-Capillary: >600 mg/dL (ref 70–99)

## 2023-05-05 SURGERY — ENDARTERECTOMY, CAROTID
Anesthesia: General | Laterality: Right

## 2023-05-05 NOTE — ED Notes (Signed)
BS improved. Pt denies sx or complaints. Denies pain, sob, nausea. "Feel normal". Husband at [facility].

## 2023-05-05 NOTE — ED Triage Notes (Signed)
Pt was brought down from pre op[ d/t cbg reading HI. Surgery was scheduled today for her carotid has been cancelled d/t the hyperglycemia and was escorted by preop nurse to ED for treatment.

## 2023-05-05 NOTE — Discharge Instructions (Signed)
Please follow-up with your primary care physician and your vascular surgeon.  In particular, discussed your regimen for diabetes control and ensure that additional preparation for your vascular procedure includes consideration of an appropriate pharmacy schedule.  Return here for concerning changes in your condition.

## 2023-05-05 NOTE — Progress Notes (Addendum)
Patient's CBG registered HI > 600 twice.  Spoke to Dr. Bradley Ferris and Dr. Randie Heinz who are both in agreement for patient to go to the emergency department and be treated for the high blood sugar.  Surgery is being cancelled and Dr. Darcella Cheshire office will reach out to the patient.  Patient will be escorted down to the ED by our nurse tech, [place].    Spoke to patient and her family to explain surgery cancellation and recommendation to go the the ED for treatment of her blood sugar.

## 2023-05-05 NOTE — ED Provider Notes (Signed)
[place] EMERGENCY DEPARTMENT AT [facility] Provider Note   CSN: [id number] Arrival date & time: 05/05/23  4098     History  Chief Complaint  Patient presents with   Hyperglycemia    Brandi Peters is a 72 y.o. female.  HPI Patient presents with her son who assists with her history. She presents from preop where she was found to have hyperglycemia.  She has no actual complaints including nausea, abdominal pain or anything else. She was preparing for carotid endarterectomy today.  She notes that she was told to hold her antihyperglycemics since yesterday.  Today in preop she is found have glucose greater than 600 twice.  She was sent here for evaluation.     Home Medications Prior to Admission medications   Medication Sig Start Date End Date Taking? Authorizing Provider  acetaminophen (TYLENOL) 500 MG tablet Take 1,000-1,500 mg by mouth every 8 (eight) hours as needed for moderate pain.   Yes [provider]  aspirin 81 MG chewable tablet Chew 81 mg by mouth daily.   Yes [provider]  Carboxymethylcellulose Sodium (REFRESH TEARS OP) Place 1 drop into both eyes daily as needed (dry eyes).   Yes [provider]  clopidogrel (PLAVIX) 75 MG tablet Take 1 tablet (75 mg total) by mouth daily. 02/11/23  Yes Yates Decamp, MD  ezetimibe (ZETIA) 10 MG tablet Take 10 mg by mouth daily. 03/02/23  Yes [provider]  furosemide (LASIX) 40 MG tablet Take 40 mg by mouth as needed for edema.   Yes [provider]  glipiZIDE (GLUCOTROL) 10 MG tablet Take 20 mg by mouth 2 (two) times daily.   Yes [provider]  labetalol (NORMODYNE) 300 MG tablet Take 300 mg by mouth 2 (two) times daily.   Yes [provider]  linaclotide (LINZESS) 145 MCG CAPS capsule Take 145 mcg by mouth daily as needed (constipation).   Yes [provider]  losartan-hydrochlorothiazide (HYZAAR) 100-25 MG tablet Take 1 tablet by mouth daily.  02/26/20  Yes [provider]  PARoxetine (PAXIL) 20 MG tablet Take 20 mg by mouth daily.   Yes [provider]  rosuvastatin (CRESTOR) 20 MG tablet Take 1 tablet (20 mg total) by mouth daily. 01/06/22  Yes Yates Decamp, MD  Semaglutide,0.25 or 0.5MG /DOS, (OZEMPIC, 0.25 OR 0.5 MG/DOSE,) 2 MG/1.5ML SOPN Inject 0.5 mg into the skin every Wednesday.   Yes [provider]  Vitamin D, Ergocalciferol, (DRISDOL) 1.25 MG (50000 UNIT) CAPS capsule Take 50,000 Units by mouth every Wednesday. 03/02/23  Yes [provider]      Allergies    Pioglitazone and Metformin hcl    Review of Systems   Review of Systems  All other systems reviewed and are negative.   Physical Exam Updated Vital Signs BP 124/60   Pulse 65   Temp 98.3 F (36.8 C)   Resp 17   Ht 5\' 3"  (1.6 m)   Wt 108.9 kg   SpO2 98%   BMI 42.51 kg/m  Physical Exam Vitals and nursing note reviewed.  Constitutional:      General: She is not in acute distress.    Appearance: She is well-developed.  HENT:     Head: Normocephalic and atraumatic.  Eyes:     Conjunctiva/sclera: Conjunctivae normal.  Cardiovascular:     Rate and Rhythm: Normal rate and regular rhythm.  Pulmonary:     Effort: Pulmonary effort is normal. No respiratory distress.  Breath sounds: Normal breath sounds. No stridor.  Abdominal:     General: There is no distension.  Skin:    General: Skin is warm and dry.  Neurological:     Mental Status: She is alert and oriented to person, place, and time.     Cranial Nerves: No cranial nerve deficit.  Psychiatric:        Mood and Affect: Mood normal.     ED Results / Procedures / Treatments   Labs (all labs ordered are listed, but only abnormal results are displayed) Labs Reviewed  GLUCOSE, CAPILLARY - Abnormal; Notable for the following components:      Result Value   Glucose-Capillary >600 (*)    All other components within normal limits  GLUCOSE, CAPILLARY - Abnormal;  Notable for the following components:   Glucose-Capillary >600 (*)    All other components within normal limits  BASIC METABOLIC PANEL - Abnormal; Notable for the following components:   Glucose, Bld 359 (*)    Creatinine, Ser 1.14 (*)    GFR, Estimated 51 (*)    All other components within normal limits  CBC - Abnormal; Notable for the following components:   RBC 3.76 (*)    Hemoglobin 10.9 (*)    HCT 33.7 (*)    All other components within normal limits  URINALYSIS, ROUTINE W REFLEX MICROSCOPIC - Abnormal; Notable for the following components:   Glucose, UA >=500 (*)    Leukocytes,Ua TRACE (*)    All other components within normal limits  I-STAT VENOUS BLOOD GAS, ED - Abnormal; Notable for the following components:   pH, Ven 7.487 (*)    pCO2, Ven 33.8 (*)    pO2, Ven 127 (*)    Acid-Base Excess 3.0 (*)    Potassium 6.8 (*)    Calcium, Ion 1.04 (*)    All other components within normal limits  I-STAT CHEM 8, ED - Abnormal; Notable for the following components:   Potassium 6.8 (*)    BUN 24 (*)    Creatinine, Ser 1.10 (*)    Glucose, Bld 367 (*)    Calcium, Ion 1.04 (*)    Hemoglobin 11.9 (*)    HCT 35.0 (*)    All other components within normal limits  CBG MONITORING, ED    EKG None  Radiology No results found.  Procedures Procedures    Medications Ordered in ED Medications - No data to display  ED Course/ Medical Decision Making/ A&P                             Medical Decision Making Adult female with multiple medical problems including diabetes, obesity, presents with hyperglycemia.  Differential includes hyperglycemia, DKA, nonketotic hyperosmolar state, with consideration electrolyte abnormalities.  However, the patient's physical exam, history otherwise reassuring some suspicion for iatrogenic hypoglycemia given instructions to hold antihyperglycemic's. Patient's evaluation are reassuring.  No evidence for acidosis, no electrolyte abnormalities other  than on point-of-care testing, not substantiated with formal blood test. Patient is mildly hyperglycemic, 340s, but with otherwise reassuring findings, will resume her oral antihyperglycemic regimen at home, follow-up with primary care.  Patient's plan for OR will be managed by vascular surgery who will reach out to her for rescheduling.  Amount and/or Complexity of Data Reviewed Independent Historian:     Details: Son at bedside, anesthesia notes External Data Reviewed: notes. Labs: ordered. Decision-making details documented in ED Course.  Risk Prescription drug  management. Decision regarding hospitalization. Diagnosis or treatment significantly limited by social determinants of health.  Final Clinical Impression(s) / ED Diagnoses Final diagnoses:  Hyperglycemia    Rx / DC Orders ED Discharge Orders     None         Gerhard Munch, MD 05/05/23 [phone]

## 2023-05-08 ENCOUNTER — Telehealth: Payer: Self-pay

## 2023-05-08 NOTE — Transitions of Care (Post Inpatient/ED Visit) (Signed)
05/08/2023  Name: Brandi Peters MRN: [id number] DOB: 30-Oct-1950  Today's TOC FU Call Status: Today's TOC FU Call Status:: Successful TOC FU Call Competed TOC FU Call Complete Date: 05/08/23  Transition Care Management Follow-up Telephone Call Date of Discharge: 05/05/23 Discharge Facility: [facility]) Type of Discharge: Emergency Department Reason for ED Visit: Other: (Hyperglycemia) How have you been since you were released from the hospital?: Better Any questions or concerns?: No  Items Reviewed: Did you receive and understand the discharge instructions provided?: Yes Medications obtained,verified, and reconciled?: No Medications Not Reviewed Reasons:: Advised Patient to Call Provider Office Any new allergies since your discharge?: No Dietary orders reviewed?: Yes Type of Diet Ordered:: Patient notes she eats more when she is stressed.  Patient lost her husband of 58 years in February. Do you have support at home?: Yes People in Home: child(ren), adult Name of Support/Comfort Primary Source: Caryn Bee  Medications Reviewed Today: Medications Reviewed Today     Reviewed by Jodelle Gross, RN (Case Manager) on 05/08/23 at 1358  Med List Status: <None>   Medication Order Taking? Sig Documenting Provider Last Dose Status Informant  acetaminophen (TYLENOL) 500 MG tablet [id number] No Take 1,000-1,500 mg by mouth every 8 (eight) hours as needed for moderate pain. [provider] Unknown Active Self           Med Note Electa Sniff, [facility]   Fri May 08, 2023  1:58 PM) EMMI/TOC call unable to reconcile medications  aspirin 81 MG chewable tablet [id number]  Chew 81 mg by mouth daily. [provider]  Active Self  Carboxymethylcellulose Sodium (REFRESH TEARS OP) [id number]  Place 1 drop into both eyes daily as needed (dry eyes). [provider]  Active Self  clopidogrel (PLAVIX) 75 MG tablet [id number]  Take 1 tablet (75 mg total) by mouth daily. Yates Decamp, MD   Active Self  ezetimibe (ZETIA) 10 MG tablet [id number]  Take 10 mg by mouth daily. [provider]  Active Self  furosemide (LASIX) 40 MG tablet [id number]  Take 40 mg by mouth as needed for edema. [provider]  Active Self  glipiZIDE (GLUCOTROL) 10 MG tablet [id number]  Take 20 mg by mouth 2 (two) times daily. [provider]  Active Self  labetalol (NORMODYNE) 300 MG tablet [id number]  Take 300 mg by mouth 2 (two) times daily. [provider]  Active Self  linaclotide Karlene Einstein) 145 MCG CAPS capsule [id number]  Take 145 mcg by mouth daily as needed (constipation). [provider]  Active Self  losartan-hydrochlorothiazide (HYZAAR) 100-25 MG tablet [id number]  Take 1 tablet by mouth daily. [provider]  Active Self  PARoxetine (PAXIL) 20 MG tablet [id number]  Take 20 mg by mouth daily. [provider]  Active Self  rosuvastatin (CRESTOR) 20 MG tablet [id number]  Take 1 tablet (20 mg total) by mouth daily. Yates Decamp, MD  Active Self  Semaglutide,0.25 or 0.5MG /DOS, (OZEMPIC, 0.25 OR 0.5 MG/DOSE,) 2 MG/1.5ML SOPN [id number]  Inject 0.5 mg into the skin every Wednesday. [provider]  Active Self  Vitamin D, Ergocalciferol, (DRISDOL) 1.25 MG (50000 UNIT) CAPS capsule [id number]  Take 50,000 Units by mouth every Wednesday. [provider]  Active Self            Home Care and Equipment/Supplies: Were Home Health Services Ordered?: No Any new equipment or medical supplies ordered?: No  Functional Questionnaire: Do you need assistance with bathing/showering or dressing?: No Do you need assistance with meal preparation?:  No Do you need assistance with eating?: No Do you have difficulty maintaining continence: No Do you need assistance with getting out of bed/getting out of a chair/moving?: No Do you have difficulty managing or taking your medications?: No  Follow up appointments reviewed: PCP Follow-up appointment  confirmed?: Yes Follow-up Provider: Patient says she is working with her primary doctor to manage her medications Specialist Hospital Follow-up appointment confirmed?: Yes Date of Specialist follow-up appointment?: 05/12/23 Follow-Up Specialty Provider:: Dr. Nadara Eaton Do you need transportation to your follow-up appointment?: No Do you understand care options if your condition(s) worsen?: Yes-patient verbalized understanding  Jodelle Gross, RN, BSN, CCM Care Management Coordinator [facility] Phone: [phone]/Fax: [phone]

## 2023-05-12 ENCOUNTER — Encounter: Payer: Self-pay | Admitting: Cardiology

## 2023-05-12 ENCOUNTER — Ambulatory Visit: Payer: Medicare Other | Admitting: Cardiology

## 2023-05-12 VITALS — BP 135/80 | HR 74 | Resp 16 | Ht 63.0 in | Wt 241.0 lb

## 2023-05-12 DIAGNOSIS — Z794 Long term (current) use of insulin: Secondary | ICD-10-CM

## 2023-05-12 DIAGNOSIS — E1159 Type 2 diabetes mellitus with other circulatory complications: Secondary | ICD-10-CM | POA: Diagnosis not present

## 2023-05-12 DIAGNOSIS — I10 Essential (primary) hypertension: Secondary | ICD-10-CM | POA: Diagnosis not present

## 2023-05-12 DIAGNOSIS — I6521 Occlusion and stenosis of right carotid artery: Secondary | ICD-10-CM

## 2023-05-12 DIAGNOSIS — I6529 Occlusion and stenosis of unspecified carotid artery: Secondary | ICD-10-CM | POA: Diagnosis not present

## 2023-05-12 DIAGNOSIS — E1122 Type 2 diabetes mellitus with diabetic chronic kidney disease: Secondary | ICD-10-CM | POA: Diagnosis not present

## 2023-05-12 DIAGNOSIS — E7800 Pure hypercholesterolemia, unspecified: Secondary | ICD-10-CM

## 2023-05-12 DIAGNOSIS — N1831 Chronic kidney disease, stage 3a: Secondary | ICD-10-CM | POA: Diagnosis not present

## 2023-05-12 MED ORDER — EMPAGLIFLOZIN 25 MG PO TABS
25.0000 mg | ORAL_TABLET | Freq: Every day | ORAL | 3 refills | Status: AC
Start: 2023-05-12 — End: ?

## 2023-05-12 NOTE — Progress Notes (Signed)
Primary Physician/Referring:  Irena Reichmann, DO  Patient ID: Brandi Peters, female    DOB: 06-Jun-1951, 72 y.o.   MRN: [id number]  Chief Complaint  Patient presents with   Asymptomatic stenosis of right carotid artery    Carotid endarterectomy scheduled   Follow-up    3 month    HPI: Brandi Peters  is a 73 y.o. female  with uncontrolled diabetes mellitus with stage IIIa chronic kidney disease, asymptomatic right carotid artery stenosis, primary hypertension, hyperlipidemia, moderate aortic stenosis and regurgitation, fibromyalgia, morbid obesity  presents here for 3 month office visit .  On her last office visit and added Zetia for uncontrolled lipids and also due to high-grade stenosis of the right carotid stenosis she was scheduled for carotid endarterectomy however surgery was canceled couple days ago due to severe hyperglycemia.  She has no started on insulin.  She now presents for follow-up.  No new symptomatology.  No TIA. Past Medical History:  Diagnosis Date   Allergy    Anxiety    Arthritis    Diabetes mellitus without complication (HCC)    Fibromyalgia    GERD (gastroesophageal reflux disease)    Gout    Heart murmur    HTN (hypertension) 03/24/2019   Hyperlipemia 03/24/2019   Type 2 diabetes mellitus with complication, without long-term current use of insulin (HCC) 03/24/2019   Social History   Tobacco Use   Smoking status: Former    Current packs/day: 0.00    Average packs/day: 1 pack/day for 25.0 years (25.0 ttl pk-yrs)    Types: Cigarettes    Start date: 9    Quit date: 2015    Years since quitting: 9.5   Smokeless tobacco: Never  Substance Use Topics   Alcohol use: Yes    Comment: on holidays    Marital Status: Married   Review of Systems  Cardiovascular:  Positive for dyspnea on exertion (stable). Negative for chest pain, leg swelling and orthopnea.  Musculoskeletal:  Positive for arthritis.  Gastrointestinal:  Negative for melena.   Objective       05/12/2023    9:13 AM 05/05/2023    8:30 AM 05/05/2023    8:15 AM  Vitals with BMI  Height 5\' 3"     Weight 241 lbs    BMI 42.7    Systolic 135 136 660  Diastolic 80 54 61  Pulse 74      Blood pressure 135/80, pulse 74, resp. rate 16, height 5\' 3"  (1.6 m), weight 241 lb (109.3 kg), SpO2 96%.   Physical Exam Constitutional:      Appearance: She is morbidly obese.  Neck:     Vascular: Carotid bruit (bilateral) present. No JVD.  Cardiovascular:     Rate and Rhythm: Normal rate and regular rhythm.     Pulses: Normal pulses and intact distal pulses.     Heart sounds: Murmur heard.     Harsh midsystolic murmur is present with a grade of 3/6 at the upper right sternal border radiating to the neck.     Blowing early diastolic murmur is present with a grade of 2/4 at the upper right sternal border.     No gallop.  Pulmonary:     Effort: Pulmonary effort is normal.     Breath sounds: Normal breath sounds.  Abdominal:     General: Bowel sounds are normal.     Palpations: Abdomen is soft.  Musculoskeletal:     Right lower leg: 1+ Edema (trace) present.  Left lower leg: 1+ Edema (trace) present.     Radiology: No results found.  Laboratory examination:      Latest Ref Rng & Units 05/05/2023    7:07 AM 05/05/2023    7:06 AM 05/05/2023    6:14 AM  CMP  Glucose 70 - 99 mg/dL  034  742   BUN 8 - 23 mg/dL  24  16   Creatinine 5.95 - 1.00 mg/dL  6.38  7.56   Sodium 433 - 145 mmol/L 135  135  137   Potassium 3.5 - 5.1 mmol/L 6.8  6.8  3.9   Chloride 98 - 111 mmol/L  105  102   CO2 22 - 32 mmol/L   25   Calcium 8.9 - 10.3 mg/dL   9.0       Latest Ref Rng & Units 05/05/2023    7:07 AM 05/05/2023    7:06 AM 05/05/2023    6:14 AM  CBC  WBC 4.0 - 10.5 K/uL   6.7   Hemoglobin 12.0 - 15.0 g/dL 29.5  18.8  41.6   Hematocrit 36.0 - 46.0 % 36.0  35.0  33.7   Platelets 150 - 400 K/uL   236    Lipid Panel     Component Value Date/Time   CHOL 124 05/27/2021 0926   TRIG 67  05/27/2021 0926   HDL 54 05/27/2021 0926   CHOLHDL 4.8 02/05/2009 0825   VLDL 19 02/05/2009 0825   LDLCALC 56 05/27/2021 0926   HEMOGLOBIN A1C Lab Results  Component Value Date   HGBA1C 9.9 (H) 04/27/2023   MPG 237 04/27/2023   External labs:  Labs 06/08/2022:  Total cholesterol 170, triglycerides 71, HDL 63, LDL 93.  Non-HDL cholesterol 107.  A1c 8.0%.  Sodium 142, potassium 4.1, BUN 20, creatinine 1.10, EGFR 58 mL, LFTs normal.  Hb 11.7/HCT 36.7, platelets 253, normal indicis.  Labs 01/26/2022:  Vitamin D 24.3.  01/26/2022: Cholesterol 157, triglycerides 87, HDL 59, LDL 81  Hemoglobin 11.2, hematocrit 35, MCV 87.7, platelet 238  Sodium 142, potassium 4.7, BUN 23, creatinine 1.13 EGFR 56  A1c 8.6%.  Vitamin D 44.3. Cardiac Studies:   Lexiscan Myoview stress test 02/24/2014: 1. Resting EKG NSR. Stress EKG was non diagnostic for ischemia. No ST-T changes of ischemia noted with pharmacologic stress testing.  Stress symptoms included nausea/vomiting and SOB. Stress terminated due to completion of protocol. 2. Perfusion imaging study demonstrated mild soft tissue attenuation in the anterior and inferior wall. There was no e/o ischemia or scar. The left ventricular systolic function was normal at 77%. This is a low risk study.  PCV ECHOCARDIOGRAM COMPLETE 05/22/2021  Narrative Echocardiogram 05/22/2021: Left ventricle cavity is normal in size. Mild concentric hypertrophy of the left ventricle. Normal global wall motion. Normal LV systolic function with EF 65%. Doppler evidence of grade I (impaired) diastolic dysfunction, normal LAP. Trileaflet aortic valve. Moderate aortic valve leaflet calcification. Moderate aortic stenosis.  Vmax 3.4 m/sec, mean PG 27 mmHg, AVA 1 cm2 by continuity equation. Dimensionless index 0.35. Moderate (Grade II) aortic regurgitation. Mild mitral valve leaflet calcification. Mild mitral valve stenosis. Mean PG 5 mmHg, MVA 2.2 cm2 by PHT method at 70 bpm. No  mitral valve regurgitation. No evidence of pulmonary hypertension. Compared to previous study in 2019, mitral stenosis and aortic regurgitation are new. Study in 2019 noted aortic valve mean gradient of 20 mmHg, calculated aortic valve area 1.38 cm.  Carotid artery duplex 02/04/2023: Duplex suggests stenosis in the right  internal carotid artery (80-99%). The right PSV internal/common carotid artery ratio of 9.91 is consistent with a stenosis of >70%. Peak velocity 465/120cm/sec.  Evidence of hemodynamically significant stenosis in the right external carotid artery. Duplex suggests stenosis in the left internal carotid artery (1-15%). Antegrade right vertebral artery flow. Antegrade left vertebral artery flow. Compared to 07/16/2022, right ICA stenosis has progressed from <70%. Follow up in four months is appropriate if clinically indicated.  Lexiscan Tetrofosmin stress test 03/30/2023: 1 Day Rest/Stress protocol.  Stress EKG is non-diagnostic, as this is pharmacological stress test using Lexiscan. Small sized, mild intensity, subtle reversibility base to mid inferolateral segments ischemia in the LCx distribution cannot be ruled out.  Otherwise, normal myocardial perfusion without prior infarct. Overall accuracy is limited due to soft tissue attenuation on raw cine images, BMI >40, images in a seated position. Calculated LVEF 61%, wall thickness preserved, no obvious regional wall motion abnormality. Prior study dated 02/24/2014 noted normal perfusion with soft tissue attenuation in the anterior and inferior wall. Low risk study.   EKG:  EKG 02/11/2023: Normal sinus rhythm at the rate of 81 bpm, left axis deviation, left intrafascicular block.  Right bundle branch block.  Bifascicular block.  Compared to 07/25/2022, no significant change.  Allergies and medications:    Allergies  Allergen Reactions   Pioglitazone Shortness Of Breath and Swelling   Metformin Hcl     diarrhea with high  doses     Current Outpatient Medications:    acetaminophen (TYLENOL) 500 MG tablet, Take 1,000-1,500 mg by mouth every 8 (eight) hours as needed for moderate pain., Disp: , Rfl:    allopurinol (ZYLOPRIM) 100 MG tablet, Take 100 mg by mouth daily., Disp: , Rfl:    aspirin 81 MG chewable tablet, Chew 81 mg by mouth daily., Disp: , Rfl:    Carboxymethylcellulose Sodium (REFRESH TEARS OP), Place 1 drop into both eyes daily as needed (dry eyes)., Disp: , Rfl:    empagliflozin (JARDIANCE) 25 MG TABS tablet, Take 1 tablet (25 mg total) by mouth daily before breakfast., Disp: 90 tablet, Rfl: 3   ezetimibe (ZETIA) 10 MG tablet, Take 10 mg by mouth daily., Disp: , Rfl:    furosemide (LASIX) 40 MG tablet, Take 40 mg by mouth as needed for edema., Disp: , Rfl:    labetalol (NORMODYNE) 300 MG tablet, Take 300 mg by mouth 2 (two) times daily., Disp: , Rfl:    linaclotide (LINZESS) 145 MCG CAPS capsule, Take 145 mcg by mouth daily as needed (constipation)., Disp: , Rfl:    losartan-hydrochlorothiazide (HYZAAR) 100-25 MG tablet, Take 1 tablet by mouth daily., Disp: , Rfl:    PARoxetine (PAXIL) 20 MG tablet, Take 20 mg by mouth daily., Disp: , Rfl:    rosuvastatin (CRESTOR) 20 MG tablet, Take 1 tablet (20 mg total) by mouth daily., Disp: 90 tablet, Rfl: 1   Semaglutide,0.25 or 0.5MG /DOS, (OZEMPIC, 0.25 OR 0.5 MG/DOSE,) 2 MG/1.5ML SOPN, Inject 0.5 mg into the skin every Wednesday., Disp: , Rfl:    TRESIBA FLEXTOUCH 200 UNIT/ML FlexTouch Pen, Inject 15 Units into the skin daily., Disp: , Rfl:    Vitamin D, Ergocalciferol, (DRISDOL) 1.25 MG (50000 UNIT) CAPS capsule, Take 50,000 Units by mouth every Wednesday., Disp: , Rfl:    clopidogrel (PLAVIX) 75 MG tablet, Take 1 tablet (75 mg total) by mouth daily., Disp: , Rfl:    Assessment     ICD-10-CM   1. Asymptomatic stenosis of right carotid artery  I65.21 empagliflozin (JARDIANCE)  25 MG TABS tablet    clopidogrel (PLAVIX) 75 MG tablet    2. Primary hypertension   I10     3. Pure hypercholesterolemia  E78.00     4. Type 2 diabetes mellitus with stage 3a chronic kidney disease, with long-term current use of insulin (HCC)  E11.22 empagliflozin (JARDIANCE) 25 MG TABS tablet   N18.31    Z79.4       Meds ordered this encounter  Medications   empagliflozin (JARDIANCE) 25 MG TABS tablet    Sig: Take 1 tablet (25 mg total) by mouth daily before breakfast.    Dispense:  90 tablet    Refill:  3   Medications Discontinued During This Encounter  Medication Reason   clopidogrel (PLAVIX) 75 MG tablet    glipiZIDE (GLUCOTROL) 10 MG tablet Change in therapy   No orders of the defined types were placed in this encounter.      Recommendations:   Brandi Peters  is a 72 y.o. with uncontrolled diabetes mellitus with stage IIIa chronic kidney disease, asymptomatic right carotid artery stenosis, primary hypertension, hyperlipidemia, moderate aortic stenosis and regurgitation, fibromyalgia, morbid obesity  presents here for 3 month office visit .  1. Asymptomatic stenosis of right carotid artery Pat.  She is aware to stop Plavix 5 days before surgery I restarted her back on Plavix as her surgery was canceled due to hyperglycemia.ient has critical stenosis of right ICA, hopefully with addition of Jardiance, making changes to her diet, diabetes will be improved better.  She is presently on Ozempic as well, I have discussed with her regarding eating slow.  - empagliflozin (JARDIANCE) 25 MG TABS tablet; Take 1 tablet (25 mg total) by mouth daily before breakfast.  Dispense: 90 tablet; Refill: 3 - clopidogrel (PLAVIX) 75 MG tablet; Take 1 tablet (75 mg total) by mouth daily.  2. Primary hypertension Blood pressure is now well-controlled on present medical regimen, no changes in the medications were done today.  3. Pure hypercholesterolemia I reviewed her labs, she needs lipid profile testing, she has been scheduled for further testing next month with Dr. Irena Reichmann, will request her to add lipids as well.  4. Type 2 diabetes mellitus with stage 3a chronic kidney disease, with long-term current use of insulin (HCC) Patient was recently started on insulin, I personally called and discussed with Dr. Irena Reichmann about addition of Jardiance.  Patient has had difficulty obtaining medications due to cost however we will try patient assistance as well if needed, I have sent in for 90-day prescriptions and also given her 1 month supply of Jardiance and advised her to contact us back if she has issues with getting the medication and will continue to supply samples until prior authorization can be performed if needed.  In view of starting Jardiance, will discontinue glipizide. - empagliflozin (JARDIANCE) 25 MG TABS tablet; Take 1 tablet (25 mg total) by mouth daily before breakfast.  Dispense: 90 tablet; Refill: 3 -Discontinued glipizide  Other orders - allopurinol (ZYLOPRIM) 100 MG tablet; Take 100 mg by mouth daily. - TRESIBA FLEXTOUCH 200 UNIT/ML FlexTouch Pen; Inject 15 Units into the skin daily.  This is a 40-minute office visit encounter in review of external labs, external records and coordination of care.   Yates Decamp, MD, [facility] 05/12/2023, 11:11 AM Office: [phone] Fax: [phone] Pager: [phone]

## 2023-05-13 ENCOUNTER — Telehealth: Payer: Self-pay | Admitting: *Deleted

## 2023-05-13 NOTE — Patient Outreach (Signed)
  Care Coordination   05/13/2023 Name: Brandi Peters MRN: [id number] DOB: 17-Sep-1951   Care Coordination Outreach Attempts:  An unsuccessful telephone outreach was attempted today to offer the patient information about available care coordination services.  Follow Up Plan:  Additional outreach attempts will be made to offer the patient care coordination information and services.   Encounter Outcome:  No Answer   Care Coordination Interventions:  No, not indicated    Reece Levy, MSW, LCSW Clinical Social Worker [facility] [phone]

## 2023-05-19 ENCOUNTER — Ambulatory Visit: Payer: Self-pay | Admitting: *Deleted

## 2023-05-19 NOTE — Patient Outreach (Signed)
  Care Coordination   Initial Visit Note   05/19/2023 Name: Brandi Peters MRN: [id number] DOB: September 13, 1951  Brandi Peters is a 72 y.o. year old female who sees Brandi Peters, [place] for primary care. I spoke with  Brandi Peters by phone today.  What matters to the patients health and wellness today?  Pt admits to feeling sad and and empty at times after the death of her husband in 04-Dec-2022.    Goals Addressed             This Visit's Progress    COMPLETED: Connect with rsources/support         Mental Health:  (Status: New goal. Patient declined further engagement on this goal. Pt with recent loss of her husband, Brandi Peters, of 58 years in 04-Dec-2022. She is connected with a group of church ladies who are also widows- they offer support to one another as well as have activities for coping ) Evaluation of current treatment plan related to Grief Motivational Interviewing employed Solution-Focused Strategies employed:  Active listening / Reflection utilized  Emotional Support Provided Provided general psycho-education for mental health needs  Participation in support group encouraged : continue with church group and consider the hospice programs also  ,  Social Determinants of Health in Patient with DMII and HTN and Grief:  (Status: Patient declined further engagement on this goal.) SDOH assessments completed: None Evaluation of current treatment plan related to unmet needs Solution-Focused Strategies employed:  Emotional Support Provided           SDOH assessments and interventions completed:  Yes  SDOH Interventions Today    Flowsheet Row Peters Recent Value  SDOH Interventions   Food Insecurity Interventions Intervention Not Indicated  Housing Interventions Intervention Not Indicated  Transportation Interventions Intervention Not Indicated  Utilities Interventions Intervention Not Indicated  Financial Strain Interventions Intervention Not Indicated  Social Connections  Interventions Intervention Not Indicated  Health Literacy Interventions Intervention Not Indicated        Care Coordination Interventions:  Yes, provided  Interventions Today    Flowsheet Row Peters Recent Value  Chronic Disease   Chronic disease during today's visit Diabetes  General Interventions   General Interventions Discussed/Reviewed General Interventions Discussed, General Interventions Reviewed, Community Resources  Mental Health Interventions   Mental Health Discussed/Reviewed Mental Health Discussed, Grief and Loss  [Pt shares she lost her husband of 58 years in December 04, 2022- although she has an abundance of love and support from 3 sons and extended family as well as church family she grieves and "feel a hole". CSW validated her feelings and offered support]  Safety Interventions   Safety Discussed/Reviewed Safety Discussed  [Pt reports she has a son staying with her]       Follow up plan: No further intervention required. Pt declines the need for Care Coordination support at this time and is advised to reach out through PCP office if needs arise.  Encounter Outcome:  Pt. Visit Completed

## 2023-06-02 DIAGNOSIS — E1122 Type 2 diabetes mellitus with diabetic chronic kidney disease: Secondary | ICD-10-CM | POA: Diagnosis not present

## 2023-06-02 DIAGNOSIS — E1159 Type 2 diabetes mellitus with other circulatory complications: Secondary | ICD-10-CM | POA: Diagnosis not present

## 2023-06-02 DIAGNOSIS — E7800 Pure hypercholesterolemia, unspecified: Secondary | ICD-10-CM | POA: Diagnosis not present

## 2023-06-02 DIAGNOSIS — I6529 Occlusion and stenosis of unspecified carotid artery: Secondary | ICD-10-CM | POA: Diagnosis not present

## 2023-06-09 DIAGNOSIS — R011 Cardiac murmur, unspecified: Secondary | ICD-10-CM | POA: Diagnosis not present

## 2023-06-09 DIAGNOSIS — I6529 Occlusion and stenosis of unspecified carotid artery: Secondary | ICD-10-CM | POA: Diagnosis not present

## 2023-06-09 DIAGNOSIS — I351 Nonrheumatic aortic (valve) insufficiency: Secondary | ICD-10-CM | POA: Diagnosis not present

## 2023-06-09 DIAGNOSIS — Z0000 Encounter for general adult medical examination without abnormal findings: Secondary | ICD-10-CM | POA: Diagnosis not present

## 2023-06-09 DIAGNOSIS — I350 Nonrheumatic aortic (valve) stenosis: Secondary | ICD-10-CM | POA: Diagnosis not present

## 2023-06-09 DIAGNOSIS — L989 Disorder of the skin and subcutaneous tissue, unspecified: Secondary | ICD-10-CM | POA: Diagnosis not present

## 2023-06-09 DIAGNOSIS — E1122 Type 2 diabetes mellitus with diabetic chronic kidney disease: Secondary | ICD-10-CM | POA: Diagnosis not present

## 2023-06-09 DIAGNOSIS — E7800 Pure hypercholesterolemia, unspecified: Secondary | ICD-10-CM | POA: Diagnosis not present

## 2023-06-09 DIAGNOSIS — N1831 Chronic kidney disease, stage 3a: Secondary | ICD-10-CM | POA: Diagnosis not present

## 2023-06-09 DIAGNOSIS — E1159 Type 2 diabetes mellitus with other circulatory complications: Secondary | ICD-10-CM | POA: Diagnosis not present

## 2023-06-09 DIAGNOSIS — M109 Gout, unspecified: Secondary | ICD-10-CM | POA: Diagnosis not present

## 2023-06-10 ENCOUNTER — Other Ambulatory Visit: Payer: Self-pay

## 2023-06-10 NOTE — Patient Outreach (Signed)
Aging Gracefully Program  06/10/2023  NAINA APEL 11-Jun-1951 [id number]   [facility] Evaluation Interviewer attempted to call patient on today regarding Aging Gracefully referral. No answer from patient after multiple rings. CMA unable to leave  confidential voicemail for patient to return call.  Will attempt to call back within 1 week.   [name] Care Management Assistant [phone]

## 2023-06-24 ENCOUNTER — Other Ambulatory Visit: Payer: Self-pay | Admitting: Family Medicine

## 2023-06-24 DIAGNOSIS — Z1231 Encounter for screening mammogram for malignant neoplasm of breast: Secondary | ICD-10-CM

## 2023-07-14 ENCOUNTER — Other Ambulatory Visit: Payer: Self-pay

## 2023-07-14 NOTE — Patient Outreach (Signed)
Aging Gracefully Program  07/14/2023  Brandi Peters 1951/05/03 [id number]   [facility] Evaluation Interviewer attempted to call patient on today regarding Aging Gracefully referral. No answer from patient after multiple rings. CMA left confidential voicemail for patient to return call.  Will attempt to call back within 1 week.   [name] Care Management Assistant [phone]

## 2023-07-22 DIAGNOSIS — Z23 Encounter for immunization: Secondary | ICD-10-CM | POA: Diagnosis not present

## 2023-07-24 DIAGNOSIS — Z23 Encounter for immunization: Secondary | ICD-10-CM | POA: Diagnosis not present

## 2023-07-31 ENCOUNTER — Ambulatory Visit
Admission: RE | Admit: 2023-07-31 | Discharge: 2023-07-31 | Disposition: A | Discharge status: Home / Self Care | Payer: Medicare Other | Source: Ambulatory Visit | Attending: Family Medicine | Admitting: Family Medicine

## 2023-07-31 DIAGNOSIS — Z1231 Encounter for screening mammogram for malignant neoplasm of breast: Secondary | ICD-10-CM

## 2023-08-11 ENCOUNTER — Other Ambulatory Visit: Payer: Self-pay

## 2023-08-11 NOTE — Patient Outreach (Signed)
Aging Gracefully Program  08/11/2023  RONICE ONISHI 20-Aug-1951 [id number]   [facility] Evaluation Interviewer attempted to call patient on today regarding Aging Gracefully referral. No answer from patient after multiple rings. CMA left confidential voicemail for patient to return call.  Will attempt to call back within 1 week.   [name] Care Management Assistant [phone]

## 2023-08-12 ENCOUNTER — Ambulatory Visit: Payer: Self-pay | Admitting: Cardiology

## 2023-08-21 ENCOUNTER — Other Ambulatory Visit: Payer: Self-pay

## 2023-08-21 NOTE — Patient Outreach (Signed)
Aging Gracefully Program  08/21/2023  Brandi Peters Aug 09, 1951 [id number]   [facility] Evaluation Interviewer attempted to call patient on today regarding Aging Gracefully referral. No answer from patient after multiple rings. CMA left confidential voicemail for patient to return call.   3rd attempt will reach out to [facility] if no returned call within 1 week.    [name] Care Management Assistant [phone]

## 2023-09-03 DIAGNOSIS — E1159 Type 2 diabetes mellitus with other circulatory complications: Secondary | ICD-10-CM | POA: Diagnosis not present

## 2023-09-03 DIAGNOSIS — N1831 Chronic kidney disease, stage 3a: Secondary | ICD-10-CM | POA: Diagnosis not present

## 2023-09-03 DIAGNOSIS — E7800 Pure hypercholesterolemia, unspecified: Secondary | ICD-10-CM | POA: Diagnosis not present

## 2023-09-03 DIAGNOSIS — E1122 Type 2 diabetes mellitus with diabetic chronic kidney disease: Secondary | ICD-10-CM | POA: Diagnosis not present

## 2023-09-09 ENCOUNTER — Other Ambulatory Visit: Payer: Self-pay

## 2023-09-09 NOTE — Patient Outreach (Signed)
Aging Gracefully Program  09/09/2023  Brandi Peters 10-04-1951 [id number]   [facility] Evaluation Interviewer attempted to call patient on today regarding Aging Gracefully referral. No answer from patient after multiple rings. CMA left confidential voicemail for patient to return call.  Will attempt to call back within 1 week.   [name] Care Management Assistant [phone]

## 2023-09-10 ENCOUNTER — Other Ambulatory Visit: Payer: Self-pay

## 2023-09-10 DIAGNOSIS — E7800 Pure hypercholesterolemia, unspecified: Secondary | ICD-10-CM | POA: Diagnosis not present

## 2023-09-10 DIAGNOSIS — I129 Hypertensive chronic kidney disease with stage 1 through stage 4 chronic kidney disease, or unspecified chronic kidney disease: Secondary | ICD-10-CM | POA: Diagnosis not present

## 2023-09-10 DIAGNOSIS — N1831 Chronic kidney disease, stage 3a: Secondary | ICD-10-CM | POA: Diagnosis not present

## 2023-09-10 DIAGNOSIS — M109 Gout, unspecified: Secondary | ICD-10-CM | POA: Diagnosis not present

## 2023-09-10 DIAGNOSIS — E1122 Type 2 diabetes mellitus with diabetic chronic kidney disease: Secondary | ICD-10-CM | POA: Diagnosis not present

## 2023-09-10 DIAGNOSIS — I350 Nonrheumatic aortic (valve) stenosis: Secondary | ICD-10-CM | POA: Diagnosis not present

## 2023-09-10 DIAGNOSIS — E1159 Type 2 diabetes mellitus with other circulatory complications: Secondary | ICD-10-CM | POA: Diagnosis not present

## 2023-09-10 NOTE — Patient Outreach (Signed)
Aging Gracefully Program  09/10/2023  Brandi Peters 07/26/1951 [id number]   [facility] Evaluation Interviewer made contact with patient. Aging Gracefully 9 month survey completed.    [name] Care Management Assistant [phone]

## 2023-09-18 ENCOUNTER — Other Ambulatory Visit: Payer: Self-pay | Admitting: Cardiology

## 2023-09-18 DIAGNOSIS — I6521 Occlusion and stenosis of right carotid artery: Secondary | ICD-10-CM

## 2023-10-15 NOTE — Progress Notes (Unsigned)
Cardiology Office Note:  .   Date:  10/17/2023  ID:  Brandi Peters, DOB May 30, 1951, MRN [id number] PCP: Irena Reichmann, DO  [facility] Providers Cardiologist:  Yates Decamp, MD   History of Present Illness: Marland Kitchen   Brandi Peters is a 72 y.o.  female  with uncontrolled diabetes mellitus with stage IIIa chronic kidney disease, asymptomatic right carotid artery stenosis, primary hypertension, hyperlipidemia, moderate aortic stenosis and regurgitation, fibromyalgia, morbid obesity  presents here for 6 month office visit .  She was found to have a high-grade stenosis of right carotid artery and I scheduled for carotid endarterectomy however on 05/05/2023 it was canceled due to hyperglycemia.  She has not had any further follow-up.  Discussed the use of AI scribe software for clinical note transcription with the patient, who gave verbal consent to proceed.  History of Present Illness   Brandi Peters, a patient with a history of carotid stenosis, primary hypertension, and hypercholesterolemia, presents for a follow-up visit. She reports that her diabetes, which had previously been uncontrolled and led to the postponement of a carotid surgery, is now being managed by Dr. Irena Reichmann. She has been on Ozempic and insulin for about a year. She reports no concerns or vision loss. She also reports that her blood pressure has been stable. She has been cooking more at home and has been spending time with her children. She reports feeling a little sad during the holidays due to the recent loss of her husband.      Review of Systems  Cardiovascular:  Negative for chest pain, dyspnea on exertion and leg swelling.    Labs   Lab Results  Component Value Date   CHOL 124 05/27/2021   HDL 54 05/27/2021   LDLCALC 56 05/27/2021   TRIG 67 05/27/2021   CHOLHDL 4.8 02/05/2009   Lab Results  Component Value Date   NA 135 05/05/2023   K 6.8 (HH) 05/05/2023   CO2 25 05/05/2023   GLUCOSE 367 (H) 05/05/2023    BUN 24 (H) 05/05/2023   CREATININE 1.10 (H) 05/05/2023   CALCIUM 9.0 05/05/2023   GFRNONAA 51 (L) 05/05/2023      Latest Ref Rng & Units 05/05/2023    7:07 AM 05/05/2023    7:06 AM 05/05/2023    6:14 AM  BMP  Glucose 70 - 99 mg/dL  409  811   BUN 8 - 23 mg/dL  24  16   Creatinine 9.14 - 1.00 mg/dL  7.82  9.56   Sodium 213 - 145 mmol/L 135  135  137   Potassium 3.5 - 5.1 mmol/L 6.8  6.8  3.9   Chloride 98 - 111 mmol/L  105  102   CO2 22 - 32 mmol/L   25   Calcium 8.9 - 10.3 mg/dL   9.0       Latest Ref Rng & Units 05/05/2023    7:07 AM 05/05/2023    7:06 AM 05/05/2023    6:14 AM  CBC  WBC 4.0 - 10.5 K/uL   6.7   Hemoglobin 12.0 - 15.0 g/dL 08.6  57.8  46.9   Hematocrit 36.0 - 46.0 % 36.0  35.0  33.7   Platelets 150 - 400 K/uL   236    External Labs:  Labs 06/02/2023:  Total cholesterol 134, triglycerides 77, HDL 55, LDL 64.  A1c 9.0%.  BUN 41, creatinine 1.580, EGFR 46 mL.  Physical Exam:   VS:  BP 130/72 (BP  Location: Left Arm, Patient Position: Sitting, Cuff Size: Large)   Pulse 84   Resp 16   Ht 5\' 3"  (1.6 m)   Wt 233 lb 3.2 oz (105.8 kg)   SpO2 98%   BMI 41.31 kg/m    Wt Readings from Last 3 Encounters:  10/16/23 233 lb 3.2 oz (105.8 kg)  05/12/23 241 lb (109.3 kg)  05/05/23 240 lb (108.9 kg)     Physical Exam Neck:     Vascular: Carotid bruit (bilateral R> L) present. No JVD.  Cardiovascular:     Rate and Rhythm: Normal rate and regular rhythm.     Pulses: Intact distal pulses.     Heart sounds: S1 normal and S2 normal. Murmur heard.     Harsh midsystolic murmur is present with a grade of 2/6 at the upper right sternal border.     No gallop.  Pulmonary:     Effort: Pulmonary effort is normal.     Breath sounds: Normal breath sounds.  Abdominal:     General: Bowel sounds are normal.     Palpations: Abdomen is soft.  Musculoskeletal:     Right lower leg: No edema.     Left lower leg: No edema.    Studies Reviewed: .    Echocardiogram  03/30/2023: Left ventricle cavity is normal in size. Moderate concentric hypertrophy of the left ventricle. Normal global wall motion. Normal LV systolic function with EF 62%. Doppler evidence of grade I (impaired) diastolic dysfunction, normal LAP. Left atrial cavity is moderately dilated at 46.6 ml/m^2. Trileaflet aortic valve. Moderate aortic valve leaflet calcification. Moderate to severe aortic stenosis. Vmax 3.6 m/sec, mean PG 23 mmHg, AVA 1.2 cm, AVAi 0.5 cm2/m2 by continuity equation, dimensionless index 0.37. Mild (Grade I) aortic regurgitation. Mild calcification of the mitral valve annulus and leaflets. Mild to moderate mitral stenosis. Mean PG 5 mmHg, MVA 1.6 cm2 by PHT method at HR 71 bpm. Normal right atrial pressure. Previous study on 05/22/2021 reported moderate aortic stenosis.  Vmax 3.4 m/sec, mean PG 27 mmHg, AVA 1 cm2 by continuity equation. Dimensionless index 0.35. Moderate (Grade II) aortic regurgitation. Mild mitral valve leaflet calcification. Mild mitral valve stenosis. Mean PG 5 mmHg, MVA 2.2 cm2 by PHT method at 70 bpm. No mitral valve regurgitation.  Lexiscan Tetrofosmin stress test 03/30/2023: 1 Day Rest/Stress protocol.  Stress EKG is non-diagnostic, as this is pharmacological stress test using Lexiscan. Small sized, mild intensity, subtle reversibility base to mid inferolateral segments ischemia in the LCx distribution cannot be ruled out.  Otherwise, normal myocardial perfusion without prior infarct. Overall accuracy is limited due to soft tissue attenuation on raw cine images, BMI >40, images in a seated position. Calculated LVEF 61%, wall thickness preserved, no obvious regional wall motion abnormality. Prior study dated 02/24/2014 noted normal perfusion with soft tissue attenuation in the anterior and inferior wall.  Carotid artery duplex 02/04/2023: Duplex suggests stenosis in the right internal carotid artery (80-99%). The right PSV internal/common carotid artery  ratio of 9.91 is consistent with a stenosis of >70%. Peak velocity 465/120cm/sec.  Evidence of hemodynamically significant stenosis in the right external carotid artery. Duplex suggests stenosis in the left internal carotid artery (1-15%). Antegrade right vertebral artery flow. Antegrade left vertebral artery flow. Compared to 07/16/2022, right ICA stenosis has progressed from <70%. Follow up in four months is appropriate if clinically indicated.  CT angiogram neck 03/17/2023: 1. Severe stenosis at the right common carotid bifurcation due to bulky calcified plaque. The lumen  is unmeasurable given the degree of stenosis and calcified plaque.  2. Atherosclerosis elsewhere in the head and neck that does not cause flow reducing stenosis. 3. Chronic right parietal infarct which is small. Low risk study.   EKG:    EKG Interpretation Date/Time:  Friday October 16 2023 11:26:06 EST Ventricular Rate:  78 PR Interval:  164 QRS Duration:  146 QT Interval:  422 QTC Calculation: 481 R Axis:   -15  Text Interpretation: EKG 10/16/2023: Normal sinus rhythm with rate of 78 bpm, normal axis, right bundle branch block.  Compared to 02/11/2023, left axis deviation and left anterior fascicular block not present.  No change in right bundle branch block. Confirmed by Delrae Rend [phone]) on 10/16/2023 11:39:31 AM    Medications and allergies    Allergies  Allergen Reactions   Pioglitazone Shortness Of Breath and Swelling   Metformin Hcl     diarrhea with high doses    Current Outpatient Medications:    acetaminophen (TYLENOL) 500 MG tablet, Take 1,000-1,500 mg by mouth every 8 (eight) hours as needed for moderate pain., Disp: , Rfl:    allopurinol (ZYLOPRIM) 100 MG tablet, Take 100 mg by mouth daily., Disp: , Rfl:    aspirin 81 MG chewable tablet, Chew 81 mg by mouth daily., Disp: , Rfl:    Carboxymethylcellulose Sodium (REFRESH TEARS OP), Place 1 drop into both eyes daily as needed (dry eyes).,  Disp: , Rfl:    clopidogrel (PLAVIX) 75 MG tablet, TAKE 1 TABLET (75 MG TOTAL) BY MOUTH DAILY., Disp: 90 tablet, Rfl: 3   empagliflozin (JARDIANCE) 25 MG TABS tablet, Take 1 tablet (25 mg total) by mouth daily before breakfast., Disp: 90 tablet, Rfl: 3   ezetimibe (ZETIA) 10 MG tablet, Take 10 mg by mouth daily., Disp: , Rfl:    furosemide (LASIX) 40 MG tablet, Take 40 mg by mouth as needed for edema., Disp: , Rfl:    labetalol (NORMODYNE) 300 MG tablet, Take 300 mg by mouth 2 (two) times daily., Disp: , Rfl:    linaclotide (LINZESS) 145 MCG CAPS capsule, Take 145 mcg by mouth daily as needed (constipation)., Disp: , Rfl:    losartan-hydrochlorothiazide (HYZAAR) 100-25 MG tablet, Take 1 tablet by mouth daily., Disp: , Rfl:    PARoxetine (PAXIL) 20 MG tablet, Take 20 mg by mouth daily., Disp: , Rfl:    rosuvastatin (CRESTOR) 20 MG tablet, Take 1 tablet (20 mg total) by mouth daily., Disp: 90 tablet, Rfl: 1   Semaglutide,0.25 or 0.5MG /DOS, (OZEMPIC, 0.25 OR 0.5 MG/DOSE,) 2 MG/1.5ML SOPN, Inject 1 mg into the skin every Wednesday., Disp: , Rfl:    TRESIBA FLEXTOUCH 200 UNIT/ML FlexTouch Pen, Inject 15 Units into the skin daily., Disp: , Rfl:    Vitamin D, Ergocalciferol, (DRISDOL) 1.25 MG (50000 UNIT) CAPS capsule, Take 50,000 Units by mouth every Wednesday., Disp: , Rfl:    ASSESSMENT AND PLAN: .      ICD-10-CM   1. Asymptomatic stenosis of right carotid artery  I65.21 EKG 12-Lead    VAS US CAROTID    2. Primary hypertension  I10     3. Pure hypercholesterolemia  E78.00 VAS US CAROTID    4. Moderate aortic stenosis  I35.0     5. Type 2 diabetes mellitus with stage 3a chronic kidney disease, with long-term current use of insulin (HCC)  E11.22    N18.31    Z79.4       Assessment and Plan    Carotid Stenosis  Previous surgery postponed due to uncontrolled diabetes. Currently on aspirin and clopidogrel for stroke prevention. -Order carotid artery duplex to assess current level of  stenosis. -Continue aspirin and clopidogrel.  Diabetes Mellitus A1c of 9.0%, indicating poor control despite use of Ozempic 1mg  and insulin. -Encourage continued follow-up with Dr. Irena Reichmann for diabetes management. -Encourage patient to continue efforts to control diabetes to facilitate carotid surgery.  Hyperlipidemia Well controlled on Eztimibe and Rosuvastatin 20mg . -Continue current regimen.  Hypertension Well controlled, no changes in medication needed. -On labetalol 300 mg p.o. twice daily and losartan HCT 100/25 mg daily.  Valvular Heart Disease Moderate AAS and MS on 03/30/2023 echocardiogram, no changes in clinical examination. -No need for repeat echocardiogram at this time, consider in 1-2 years. -No worsening dyspnea, no clinical evidence of heart failure.  He  Chronic Kidney Disease stage IIIa Stable, no changes needed in current management. -Presently tolerating losartan HCT 100/25 mg daily chronically.  Follow-up in 6 months, sooner if needed after carotid duplex results.   Signed,  Yates Decamp, MD, [facility] 10/17/2023, 5:45 AM [facility] [address] [place], [place] [address] Phone: [phone]. Fax:  [phone]

## 2023-10-16 ENCOUNTER — Ambulatory Visit: Payer: Medicare Other | Attending: Cardiology | Admitting: Cardiology

## 2023-10-16 VITALS — BP 130/72 | HR 84 | Resp 16 | Ht 63.0 in | Wt 233.2 lb

## 2023-10-16 DIAGNOSIS — I10 Essential (primary) hypertension: Secondary | ICD-10-CM | POA: Insufficient documentation

## 2023-10-16 DIAGNOSIS — Z794 Long term (current) use of insulin: Secondary | ICD-10-CM | POA: Insufficient documentation

## 2023-10-16 DIAGNOSIS — I6521 Occlusion and stenosis of right carotid artery: Secondary | ICD-10-CM | POA: Insufficient documentation

## 2023-10-16 DIAGNOSIS — I350 Nonrheumatic aortic (valve) stenosis: Secondary | ICD-10-CM | POA: Diagnosis not present

## 2023-10-16 DIAGNOSIS — N1831 Chronic kidney disease, stage 3a: Secondary | ICD-10-CM | POA: Insufficient documentation

## 2023-10-16 DIAGNOSIS — E7800 Pure hypercholesterolemia, unspecified: Secondary | ICD-10-CM | POA: Diagnosis not present

## 2023-10-16 DIAGNOSIS — E1122 Type 2 diabetes mellitus with diabetic chronic kidney disease: Secondary | ICD-10-CM | POA: Diagnosis not present

## 2023-10-16 NOTE — Patient Instructions (Signed)
Testing/Procedures: Carotid Ultrasound Your physician has requested that you have a carotid duplex. This test is an ultrasound of the carotid arteries in your neck. It looks at blood flow through these arteries that supply the brain with blood. Allow one hour for this exam. There are no restrictions or special instructions.  Follow-Up: At [facility], you and your health needs are our priority.  As part of our continuing mission to provide you with exceptional heart care, we have created designated Provider Care Teams.  These Care Teams include your primary Cardiologist (physician) and Advanced Practice Providers (APPs -  Physician Assistants and Nurse Practitioners) who all work together to provide you with the care you need, when you need it.  Your next appointment:   6 month(s)  Provider:   Yates Decamp, MD

## 2023-10-17 ENCOUNTER — Encounter: Payer: Self-pay | Admitting: Cardiology

## 2023-11-18 DIAGNOSIS — I129 Hypertensive chronic kidney disease with stage 1 through stage 4 chronic kidney disease, or unspecified chronic kidney disease: Secondary | ICD-10-CM | POA: Diagnosis not present

## 2023-11-18 DIAGNOSIS — E7800 Pure hypercholesterolemia, unspecified: Secondary | ICD-10-CM | POA: Diagnosis not present

## 2023-11-18 DIAGNOSIS — E1122 Type 2 diabetes mellitus with diabetic chronic kidney disease: Secondary | ICD-10-CM | POA: Diagnosis not present

## 2023-11-18 DIAGNOSIS — N1831 Chronic kidney disease, stage 3a: Secondary | ICD-10-CM | POA: Diagnosis not present

## 2023-11-18 DIAGNOSIS — M109 Gout, unspecified: Secondary | ICD-10-CM | POA: Diagnosis not present

## 2023-11-18 DIAGNOSIS — E1159 Type 2 diabetes mellitus with other circulatory complications: Secondary | ICD-10-CM | POA: Diagnosis not present

## 2023-11-20 ENCOUNTER — Other Ambulatory Visit: Payer: Self-pay | Admitting: Cardiology

## 2023-11-25 DIAGNOSIS — N1831 Chronic kidney disease, stage 3a: Secondary | ICD-10-CM | POA: Diagnosis not present

## 2023-11-25 DIAGNOSIS — I6529 Occlusion and stenosis of unspecified carotid artery: Secondary | ICD-10-CM | POA: Diagnosis not present

## 2023-11-25 DIAGNOSIS — E1122 Type 2 diabetes mellitus with diabetic chronic kidney disease: Secondary | ICD-10-CM | POA: Diagnosis not present

## 2023-11-25 DIAGNOSIS — E1159 Type 2 diabetes mellitus with other circulatory complications: Secondary | ICD-10-CM | POA: Diagnosis not present

## 2023-11-25 DIAGNOSIS — I129 Hypertensive chronic kidney disease with stage 1 through stage 4 chronic kidney disease, or unspecified chronic kidney disease: Secondary | ICD-10-CM | POA: Diagnosis not present

## 2023-11-25 DIAGNOSIS — E7800 Pure hypercholesterolemia, unspecified: Secondary | ICD-10-CM | POA: Diagnosis not present

## 2023-11-25 DIAGNOSIS — R0981 Nasal congestion: Secondary | ICD-10-CM | POA: Diagnosis not present

## 2023-11-25 DIAGNOSIS — I350 Nonrheumatic aortic (valve) stenosis: Secondary | ICD-10-CM | POA: Diagnosis not present

## 2023-11-25 DIAGNOSIS — M109 Gout, unspecified: Secondary | ICD-10-CM | POA: Diagnosis not present

## 2023-12-14 ENCOUNTER — Ambulatory Visit (HOSPITAL_COMMUNITY)
Admission: RE | Admit: 2023-12-14 | Discharge: 2023-12-14 | Disposition: A | Discharge status: Home / Self Care | Payer: Medicare Other | Source: Ambulatory Visit | Attending: Cardiology | Admitting: Cardiology

## 2023-12-14 DIAGNOSIS — E7800 Pure hypercholesterolemia, unspecified: Secondary | ICD-10-CM | POA: Diagnosis not present

## 2023-12-14 DIAGNOSIS — I6521 Occlusion and stenosis of right carotid artery: Secondary | ICD-10-CM | POA: Diagnosis not present

## 2023-12-27 ENCOUNTER — Encounter: Payer: Self-pay | Admitting: Cardiology

## 2023-12-27 NOTE — Progress Notes (Signed)
 Patient has critical right carotid stenosis however diabetes still continues to be a major issue which is uncontrolled and previously surgery was postponed due to uncontrolled diabetes.  Weight loss and discussion with PCP regarding diabetes management will certainly help [place] to treat her asymptomatic high-grade right carotid stenosis.  I am seeing her back in June, we will discuss further during that time.  Please ask her to discuss with PCP whether she needs to have endocrinology manage her diabetes.  I am concerned about her stroke risk.

## 2023-12-29 DIAGNOSIS — R040 Epistaxis: Secondary | ICD-10-CM | POA: Diagnosis not present

## 2023-12-29 DIAGNOSIS — R0981 Nasal congestion: Secondary | ICD-10-CM | POA: Diagnosis not present

## 2024-03-02 ENCOUNTER — Telehealth (INDEPENDENT_AMBULATORY_CARE_PROVIDER_SITE_OTHER): Payer: Self-pay | Admitting: Otolaryngology

## 2024-03-02 NOTE — Telephone Encounter (Signed)
 pt confirmed appt & location [phone] afm

## 2024-03-09 ENCOUNTER — Encounter (INDEPENDENT_AMBULATORY_CARE_PROVIDER_SITE_OTHER): Payer: Self-pay | Admitting: Otolaryngology

## 2024-03-09 ENCOUNTER — Ambulatory Visit (INDEPENDENT_AMBULATORY_CARE_PROVIDER_SITE_OTHER): Admitting: Otolaryngology

## 2024-03-09 VITALS — BP 120/64 | HR 89 | Ht 63.0 in | Wt 230.0 lb

## 2024-03-09 DIAGNOSIS — R0982 Postnasal drip: Secondary | ICD-10-CM

## 2024-03-09 DIAGNOSIS — J343 Hypertrophy of nasal turbinates: Secondary | ICD-10-CM | POA: Diagnosis not present

## 2024-03-09 DIAGNOSIS — R0981 Nasal congestion: Secondary | ICD-10-CM | POA: Diagnosis not present

## 2024-03-09 DIAGNOSIS — R040 Epistaxis: Secondary | ICD-10-CM

## 2024-03-09 DIAGNOSIS — J3089 Other allergic rhinitis: Secondary | ICD-10-CM

## 2024-03-09 DIAGNOSIS — R09A2 Foreign body sensation, throat: Secondary | ICD-10-CM | POA: Diagnosis not present

## 2024-03-09 DIAGNOSIS — K219 Gastro-esophageal reflux disease without esophagitis: Secondary | ICD-10-CM | POA: Diagnosis not present

## 2024-03-09 DIAGNOSIS — J342 Deviated nasal septum: Secondary | ICD-10-CM

## 2024-03-09 MED ORDER — AZELASTINE HCL 0.1 % NA SOLN: 2.0000 | Freq: Two times a day (BID) | NASAL | 12 refills | Status: AC

## 2024-03-09 MED ORDER — FAMOTIDINE 20 MG PO TABS: 20.0000 mg | ORAL_TABLET | Freq: Two times a day (BID) | ORAL | 1 refills | Status: AC

## 2024-03-09 MED ORDER — LEVOCETIRIZINE DIHYDROCHLORIDE 5 MG PO TABS: 5.0000 mg | ORAL_TABLET | Freq: Every evening | ORAL | 3 refills | Status: DC

## 2024-03-09 MED ORDER — IPRATROPIUM BROMIDE 0.03 % NA SOLN: 2.0000 | Freq: Two times a day (BID) | NASAL | 12 refills | Status: AC

## 2024-03-09 NOTE — Patient Instructions (Signed)

## 2024-03-09 NOTE — Progress Notes (Signed)
 ENT CONSULT:  Reason for Consult: epistaxis   HPI: 73 year old female with history of chronic nasal congestion is here for resolved episodes of epistaxis she experienced approximately 2 months ago and globus sensation. She reports that approximately 2 months ago she had a cold and significant worsening of her chronic nasal congestion with some blood-tinged nasal secretions.  She is on chronic Plavix  therapy.  She is not on any nasal sprays or medications for allergies.  She always has postnasal drainage and rhinorrhea.  Also reports globus sensation with a feeling of something stuck in her throat from time to time, denies pain, denies coughing or choking when eating or drinking.  Denies history of heartburn.  Last episode of epistaxis approximately 2 months ago.  No bleeding since then.  No prior allergy testing.  She reports poor sense of smell and at times her food taste like plastic to her.   Records Reviewed:  Cardiology office visit 10/16/23 KISSY CIELO is a 73 y.o.  female  with uncontrolled diabetes mellitus with stage IIIa chronic kidney disease, asymptomatic right carotid artery stenosis, primary hypertension, hyperlipidemia, moderate aortic stenosis and regurgitation, fibromyalgia, morbid obesity  presents here for 6 month office visit .  She was found to have a high-grade stenosis of right carotid artery and I scheduled for carotid endarterectomy however on 05/05/2023 it was canceled due to hyperglycemia.  She has not had any further follow-up.    Past Medical History:  Diagnosis Date   Allergy    Anxiety    Arthritis    Diabetes mellitus without complication (HCC)    Fibromyalgia    GERD (gastroesophageal reflux disease)    Gout    Heart murmur    HTN (hypertension) 03/24/2019   Hyperlipemia 03/24/2019   Type 2 diabetes mellitus with complication, without long-term current use of insulin (HCC) 03/24/2019    Past Surgical History:  Procedure Laterality Date   ABDOMINAL  HYSTERECTOMY     BREAST BIOPSY Left 06/23/2012   BREAST EXCISIONAL BIOPSY Left a long time ago per pt   no visible scar   CHOLECYSTECTOMY     JOINT REPLACEMENT Left    shoulder    Family History  Problem Relation Age of Onset   Heart disease Mother    Hypertension Mother    Hypertension Father    Hyperlipidemia Father    Heart disease Brother    Hypertension Brother    Hypertension Brother     Social History:  reports that she quit smoking about 10 years ago. Her smoking use included cigarettes. She started smoking about 35 years ago. She has a 25 pack-year smoking history. She has never used smokeless tobacco. She reports current alcohol use. She reports that she does not use drugs.  Allergies:  Allergies  Allergen Reactions   Pioglitazone Shortness Of Breath and Swelling   Metformin Hcl     diarrhea with high doses    Medications: I have reviewed the patient's current medications.  The PMH, PSH, Medications, Allergies, and SH were reviewed and updated.  ROS: Constitutional: Negative for fever, weight loss and weight gain. Cardiovascular: Negative for chest pain and dyspnea on exertion. Respiratory: Is not experiencing shortness of breath at rest. Gastrointestinal: Negative for nausea and vomiting. Neurological: Negative for headaches. Psychiatric: The patient is not nervous/anxious  Blood pressure 120/64, pulse 89, height 5\' 3"  (1.6 m), weight 230 lb (104.3 kg), SpO2 97%. Body mass index is 40.74 kg/m.  PHYSICAL EXAM:  Exam: General: Well-developed, well-nourished  Respiratory Respiratory effort: Equal inspiration and expiration without stridor Cardiovascular Peripheral Vascular: Warm extremities with equal color/perfusion Eyes: No nystagmus with equal extraocular motion bilaterally Neuro/Psych/Balance: Patient oriented to person, place, and time; Appropriate mood and affect; Gait is intact with no imbalance; Cranial nerves I-XII are intact Head and  Face Inspection: Normocephalic and atraumatic without mass or lesion Palpation: Facial skeleton intact without bony stepoffs Salivary Glands: No mass or tenderness Facial Strength: Facial motility symmetric and full bilaterally ENT Pinna: External ear intact and fully developed External canal: Canal is patent with intact skin Tympanic Membrane: Clear and mobile External Nose: No scar or anatomic deformity Internal Nose: Septum is S-shaped with R > L narrowing of the nasal passages No polyp, or purulence. Mucosal edema and erythema present.  Bilateral inferior turbinate hypertrophy.  Lips, Teeth, and gums: Mucosa and teeth intact and viable TMJ: No pain to palpation with full mobility Oral cavity/oropharynx: No erythema or exudate, no lesions present Nasopharynx: No mass or lesion with intact mucosa Hypopharynx: Intact mucosa without pooling of secretions Larynx Glottic: Full true vocal cord mobility without lesion or mass Supraglottic: Normal appearing epiglottis and AE folds Interarytenoid Space: Moderate pachydermia&edema Subglottic Space: Patent without lesion or edema Neck Neck and Trachea: Midline trachea without mass or lesion Thyroid : No mass or nodularity Lymphatics: No lymphadenopathy  Procedure: Preoperative diagnosis: globus sensation   Postoperative diagnosis:   Same + GERD LPR  Procedure: Flexible fiberoptic laryngoscopy  Surgeon: Florice Hindle, MD  Anesthesia: Topical lidocaine and Afrin Complications: None Condition is stable throughout exam  Indications and consent:  The patient presents to the clinic with above symptoms. Indirect laryngoscopy view was incomplete. Thus it was recommended that they undergo a flexible fiberoptic laryngoscopy. All of the risks, benefits, and potential complications were reviewed with the patient preoperatively and verbal informed consent was obtained.  Procedure: The patient was seated upright in the clinic. Topical lidocaine  and Afrin were applied to the nasal cavity. After adequate anesthesia had occurred, I then proceeded to pass the flexible telescope into the nasal cavity. The nasal cavity was patent without rhinorrhea or polyp. The nasopharynx was also patent without mass or lesion. The base of tongue was visualized and was normal. There were no signs of pooling of secretions in the piriform sinuses. The true vocal folds were mobile bilaterally. There were no signs of glottic or supraglottic mucosal lesion or mass. There was moderate interarytenoid pachydermia and post cricoid edema. The telescope was then slowly withdrawn and the patient tolerated the procedure throughout.    PROCEDURE NOTE: nasal endoscopy  Preoperative diagnosis: epistaxis and chronic nasal congestion symptoms  Postoperative diagnosis: same  Procedure: Diagnostic nasal endoscopy (16109)  Surgeon: Artice Last, M.D.  Anesthesia: Topical lidocaine and Afrin  H&P REVIEW: The patient's history and physical were reviewed today prior to procedure. All medications were reviewed and updated as well. Complications: None Condition is stable throughout exam Indications and consent: The patient presents with symptoms of chronic sinusitis not responding to previous therapies. All the risks, benefits, and potential complications were reviewed with the patient preoperatively and informed consent was obtained. The time out was completed with confirmation of the correct procedure.   Procedure: The patient was seated upright in the clinic. Topical lidocaine and Afrin were applied to the nasal cavity. After adequate anesthesia had occurred, the rigid nasal endoscope was passed into the nasal cavity. The nasal mucosa, turbinates, septum, and sinus drainage pathways were visualized bilaterally. This revealed no purulence or significant secretions  that might be cultured. There were no polyps or sites of significant inflammation. The mucosa was intact and there  was no crusting present. The scope was then slowly withdrawn and the patient tolerated the procedure well. There were no complications or blood loss.    Assessment/Plan: Assessment & Plan  Chronic nasal congestion suspected environmental allergies Nasal endoscopy today with significant nasal congestion as shaped septum and narrowing of bilateral nasal passages although worse on the right, no polyps or pus, some postnasal drainage along the posterior pharyngeal wall -Will initiate Astelin and Atrovent nasal sprays to reduce the amount of nasal drainage - No evidence of epistaxis on exam today and she denies any episodes for the past 2 months - Xyzal 5 mg nightly - Will consider allergy testing in the future  Epistaxis resolved Likely due to having a cold then forcefully blowing her nose during cold symptoms Epistaxis prevention instructions given to patient Epistaxis prevention instructions given to the patient: - use nasal saline spray x6/day and Vaseline twice a day to prevent nose bleeds - for active nose bleeds use Afrin and nasal tip pressure x 10 min to stop them - if nose bleed does not stop with above measures, please go to Emergency Room  - please see your primary care provider to check your blood pressure and make sure it is under control - return for recurrent nose bleeds and we will consider cautery of your nasal blood vessels  - Purchase BleedStop to have at home and help with epistaxis control   Globus sensation suspected GERD LPR She describes intermittent sensation of a lump in her throat, flexible scope exam today without masses or lesions but findings consistent with GERD LPR - Pepcid 20 mg BID  -  Reflux Gourmet after meals - diet and lifestyle changes to minimize GERD - Refer to [employer] blog for dietary and lifestyle modifications/reflux cook book   RTC as needed  Thank you for allowing me to participate in the care of this patient. Please do not hesitate  to contact me with any questions or concerns.   Artice Last, MD Otolaryngology [facility] Phone: [phone] Fax: [phone]    03/09/2024, 1:55 PM

## 2024-03-23 DIAGNOSIS — K219 Gastro-esophageal reflux disease without esophagitis: Secondary | ICD-10-CM | POA: Diagnosis not present

## 2024-03-23 DIAGNOSIS — N898 Other specified noninflammatory disorders of vagina: Secondary | ICD-10-CM | POA: Diagnosis not present

## 2024-03-23 DIAGNOSIS — Z9109 Other allergy status, other than to drugs and biological substances: Secondary | ICD-10-CM | POA: Diagnosis not present

## 2024-03-23 DIAGNOSIS — R600 Localized edema: Secondary | ICD-10-CM | POA: Diagnosis not present

## 2024-03-23 DIAGNOSIS — L309 Dermatitis, unspecified: Secondary | ICD-10-CM | POA: Diagnosis not present

## 2024-04-14 ENCOUNTER — Encounter: Payer: Self-pay | Admitting: Cardiology

## 2024-04-14 ENCOUNTER — Ambulatory Visit: Attending: Cardiology | Admitting: Cardiology

## 2024-04-14 VITALS — BP 124/74 | HR 85 | Resp 14 | Ht 63.0 in | Wt 234.8 lb

## 2024-04-14 DIAGNOSIS — I6521 Occlusion and stenosis of right carotid artery: Secondary | ICD-10-CM | POA: Insufficient documentation

## 2024-04-14 DIAGNOSIS — I080 Rheumatic disorders of both mitral and aortic valves: Secondary | ICD-10-CM | POA: Insufficient documentation

## 2024-04-14 DIAGNOSIS — I359 Nonrheumatic aortic valve disorder, unspecified: Secondary | ICD-10-CM | POA: Insufficient documentation

## 2024-04-14 DIAGNOSIS — E7800 Pure hypercholesterolemia, unspecified: Secondary | ICD-10-CM | POA: Diagnosis not present

## 2024-04-14 DIAGNOSIS — E1165 Type 2 diabetes mellitus with hyperglycemia: Secondary | ICD-10-CM | POA: Diagnosis not present

## 2024-04-14 NOTE — Progress Notes (Signed)
 Cardiology Office Note:  .   Date:  04/14/2024  ID:  Brandi, Peters [birth year], MRN [id number] PCP: Brandi Brunet, DO  [facility] Providers Cardiologist:  Brandi Bergamo, MD   History of Present Illness: Brandi   Brandi Peters is a 74 y.o.  female  with uncontrolled diabetes mellitus with stage IIIa chronic kidney disease, asymptomatic right carotid artery stenosis, primary hypertension, hyperlipidemia, moderate aortic stenosis and regurgitation, fibromyalgia, morbid obesity  presents here for 6 month office visit . She was found to have a high-grade stenosis of right carotid artery and I scheduled for carotid endarterectomy however on 05/05/2023 it was canceled due to hyperglycemia.  She underwent repeat carotid artery duplex on 12/27/2023 again revealing high-grade 80 to 99% stenosis.  She has had a low risk nuclear stress test with normal EF on 03/30/2023 and normal EF by echocardiogram on 03/30/2023 revealing moderate aortic stenosis and mild aortic regurgitation and also mild to moderate mitral stenosis.    Discussed the use of AI scribe software for clinical note transcription with the patient, who gave verbal consent to proceed.  History of Present Illness Brandi Peters is a 73 year old female with diabetes and carotid artery stenosis who presents with concerns about stroke risk and diabetes management.  She experiences significant anxiety about her risk of stroke or heart attack due to carotid artery stenosis. Her primary concern is the potential for a stroke if the plaque were to dislodge and travel to the brain. A carotid duplex study in February confirmed a high-grade blockage on the right side, with no change from the previous year.  She has non-rheumatic aortic valve disorder, including aortic stenosis and regurgitation, as well as mild mitral stenosis.  Morbid obesity continues to be very limiting factor.  Her cholesterol levels have increased since January, and she understands  the importance of maintaining controlled cholesterol levels for carotid artery stability. She is taking Ozempic at a dose of 2 mg, which has not been effective in reducing her appetite or aiding in weight loss.    Labs    Lab Results  Component Value Date   NA 135 05/05/2023   K 6.8 (HH) 05/05/2023   CO2 25 05/05/2023   GLUCOSE 367 (H) 05/05/2023   BUN 24 (H) 05/05/2023   CREATININE 1.10 (H) 05/05/2023   CALCIUM  9.0 05/05/2023   GFRNONAA 51 (L) 05/05/2023      Latest Ref Rng & Units 05/05/2023    7:07 AM 05/05/2023    7:06 AM 05/05/2023    6:14 AM  BMP  Glucose 70 - 99 mg/dL  632  640   BUN 8 - 23 mg/dL  24  16   Creatinine 9.55 - 1.00 mg/dL  8.89  8.85   Sodium 864 - 145 mmol/L 135  135  137   Potassium 3.5 - 5.1 mmol/L 6.8  6.8  3.9   Chloride 98 - 111 mmol/L  105  102   CO2 22 - 32 mmol/L   25   Calcium  8.9 - 10.3 mg/dL   9.0       Latest Ref Rng & Units 05/05/2023    7:07 AM 05/05/2023    7:06 AM 05/05/2023    6:14 AM  CBC  WBC 4.0 - 10.5 K/uL   6.7   Hemoglobin 12.0 - 15.0 g/dL 87.7  88.0  89.0   Hematocrit 36.0 - 46.0 % 36.0  35.0  33.7   Platelets 150 - 400 K/uL  236    Lab Results  Component Value Date   HGBA1C 9.9 (H) 04/27/2023     ROS  Review of Systems  Cardiovascular:  Positive for dyspnea on exertion (mild and stable). Negative for chest pain and leg swelling.    Physical Exam:   VS:  BP 124/74 (BP Location: Left Arm, Patient Position: Sitting, Cuff Size: Normal)   Pulse 85   Resp 14   Ht 5' 3 (1.6 m)   Wt 234 lb 12.8 oz (106.5 kg)   SpO2 98%   BMI 41.59 kg/m    Wt Readings from Last 3 Encounters:  04/14/24 234 lb 12.8 oz (106.5 kg)  03/09/24 230 lb (104.3 kg)  10/16/23 233 lb 3.2 oz (105.8 kg)    Physical Exam Constitutional:      Appearance: She is morbidly obese.  Neck:     Vascular: Carotid bruit (bilateral) present. No JVD.   Cardiovascular:     Rate and Rhythm: Normal rate and regular rhythm.     Pulses: Intact distal pulses.      Heart sounds: Murmur heard.     Midsystolic murmur is present with a grade of 2/6 at the upper right sternal border.     No gallop.  Pulmonary:     Effort: Pulmonary effort is normal.     Breath sounds: Normal breath sounds.  Abdominal:     General: Bowel sounds are normal.     Palpations: Abdomen is soft.     Comments: Large pannus present   Musculoskeletal:     Right lower leg: No edema.     Left lower leg: No edema.    Studies Reviewed: .    Echocardiogram 03/30/2023: Left ventricle cavity is normal in size. Moderate concentric hypertrophy of the left ventricle. Normal global wall motion. Normal LV systolic function with EF 62%. Doppler evidence of grade I (impaired) diastolic dysfunction, normal LAP. Left atrial cavity is moderately dilated at 46.6 ml/m^2. Trileaflet aortic valve. Moderate aortic valve leaflet calcification. Moderate to severe aortic stenosis. Vmax 3.6 m/sec, mean PG 23 mmHg, AVA 1.2 cm, AVAi 0.5 cm2/m2 by continuity equation, dimensionless index 0.37. Mild (Grade I) aortic regurgitation. Mild calcification of the mitral valve annulus and leaflets. Mild to moderate mitral stenosis. Mean PG 5 mmHg, MVA 1.6 cm2 by PHT method at HR 71 bpm. Normal right atrial pressure. Previous study on 05/22/2021 reported moderate aortic stenosis.  Vmax 3.4 m/sec, mean PG 27 mmHg, AVA 1 cm2 by continuity equation. Dimensionless index 0.35. Moderate (Grade II) aortic regurgitation. Mild mitral valve leaflet calcification. Mild mitral valve stenosis. Mean PG 5 mmHg, MVA 2.2 cm2 by PHT method at 70 bpm. No mitral valve regurgitation.  Carotid artery duplex 12/14/2023:  Right Carotid: Velocities in the right ICA are consistent with a 80-99% stenosis. The ECA appears >50% stenosed.  Left Carotid: Velocities in the left ICA are consistent with a 1-39% stenosis.  Vertebrals: Bilateral vertebral arteries demonstrate antegrade flow.  Subclavians: Normal flow hemodynamics were seen in  bilateral subclavian arteries.  Suggest follow up study in 6 months. No significant change from 02/04/2023.   EKG:         Medications ordered    No orders of the defined types were placed in this encounter.    ASSESSMENT AND PLAN: .      ICD-10-CM   1. Asymptomatic stenosis of right carotid artery  I65.21 Lipid Profile    VAS US  CAROTID    2. Type 2 diabetes mellitus  with hyperglycemia, without long-term current use of insulin (HCC)  E11.65     3. Mitral and aortic valve disease: Mod AS, Mild AI, mild MS  I08.0     4. Pure hypercholesterolemia  E78.00 Lipid Profile     Assessment & Plan High-grade carotid artery stenosis High-grade stenosis on the right side, stable over the past year as per carotid duplex studies. No acute symptoms reported. Anxiety about potential stroke due to plaque rupture. Explained that complete closure of the artery may not result in a stroke due to collateral circulation from other arteries as the stenosis has been chronic for the past greater than a year.  Her rate limiting factor has been morbid obesity, uncontrolled diabetes mellitus which led to canceling of carotid endarterectomy last year by anesthesia. - Send a message to Dr. Penne Colorado, vascular surgeon, to reassess the need for intervention. - Order carotid duplex in six months to monitor stenosis.  Nonrheumatic aortic valve disorder Mild to moderate aortic stenosis and mild regurgitation. No current symptoms of heart failure. Condition is not severe enough to warrant surgical intervention.  Mitral valve stenosis Mild mitral valve stenosis, not causing significant symptoms. No need for immediate intervention.  Type 2 diabetes mellitus with hyperglycemia Diabetes management is suboptimal, leading to concerns about potential complications such as diabetic ketoacidosis or infection post-surgery. Current medication, Ozempic, is not aiding in weight loss or appetite suppression. - Send a note to  Dr. Lonell Collet to discuss switching from Ozempic to Mounjaro for better diabetes management and potential weight loss benefits.  Hyperlipidemia Cholesterol levels previously elevated but have improved. Importance of maintaining controlled lipid levels to prevent carotid artery complications discussed. - Check lipid panel today to assess current cholesterol levels. - Address levels once labs are reported to make changes to the medications.  I will repeat carotid artery duplex 6 months from prior carotid duplex there is sometime in August 2025 I have like to see her back then for close monitoring.   Signed,  Brandi Bergamo, MD, [facility] 04/14/2024, 6:35 PM [facility] [address] [place], [place] [address] Phone: [phone]. Fax:  [phone]

## 2024-04-14 NOTE — Patient Instructions (Signed)
 Medication Instructions:  Your physician recommends that you continue on your current medications as directed. Please refer to the Current Medication list given to you today.  *If you need a refill on your cardiac medications before your next appointment, please call your pharmacy*  Lab Work: Have lab work done today in the lab on the first floor--Lipids If you have labs (blood work) drawn today and your tests are completely normal, you will receive your results only by: MyChart Message (if you have MyChart) OR A paper copy in the mail If you have any lab test that is abnormal or we need to change your treatment, we will call you to review the results.  Testing/Procedures: Your physician has requested that you have a carotid duplex in August. This test is an ultrasound of the carotid arteries in your neck. It looks at blood flow through these arteries that supply the brain with blood. Allow one hour for this exam. There are no restrictions or special instructions.   Follow-Up: At [facility], you and your health needs are our priority.  As part of our continuing mission to provide you with exceptional heart care, our providers are all part of one team.  This team includes your primary Cardiologist (physician) and Advanced Practice Providers or APPs (Physician Assistants and Nurse Practitioners) who all work together to provide you with the care you need, when you need it.  Your next appointment:   3 month(s)  Provider:   Gordy Bergamo, MD    We recommend signing up for the patient portal called MyChart.  Sign up information is provided on this After Visit Summary.  MyChart is used to connect with patients for Virtual Visits (Telemedicine).  Patients are able to view lab/test results, encounter notes, upcoming appointments, etc.  Non-urgent messages can be sent to your provider as well.   To learn more about what you can do with MyChart, go to [email or web address].   Other  Instructions

## 2024-04-15 ENCOUNTER — Ambulatory Visit: Payer: Medicare Other | Admitting: Cardiology

## 2024-04-15 LAB — LIPID PANEL
Chol/HDL Ratio: 2.7 ratio (ref 0.0–4.4)
Cholesterol, Total: 155 mg/dL (ref 100–199)
HDL: 57 mg/dL (ref >39)
LDL Chol Calc (NIH): 85 mg/dL (ref 0–99)
Triglycerides: 65 mg/dL (ref 0–149)
VLDL Cholesterol Cal: 13 mg/dL (ref 5–40)

## 2024-04-17 ENCOUNTER — Ambulatory Visit: Payer: Self-pay | Admitting: Cardiology

## 2024-04-17 DIAGNOSIS — E7800 Pure hypercholesterolemia, unspecified: Secondary | ICD-10-CM

## 2024-04-17 NOTE — Progress Notes (Signed)
 LDL is not at target, LDL target is <70.  Presently on Crestor  20 mg daily, increased to 40 mg daily and add Zetia  10 mg daily, recheck lipids in 3 months

## 2024-04-18 NOTE — Progress Notes (Signed)
 Please refer to lipid clinic for repatha. Thank you. Now I remember I did the lipids as I had Rx Zetia 

## 2024-04-25 ENCOUNTER — Telehealth: Payer: Self-pay | Admitting: Vascular Surgery

## 2024-04-25 NOTE — Telephone Encounter (Signed)
 Pt appt scheduled

## 2024-04-27 ENCOUNTER — Telehealth: Payer: Self-pay

## 2024-04-27 ENCOUNTER — Encounter: Payer: Self-pay | Admitting: Vascular Surgery

## 2024-04-27 ENCOUNTER — Ambulatory Visit: Attending: Vascular Surgery | Admitting: Vascular Surgery

## 2024-04-27 VITALS — BP 128/77 | HR 65 | Temp 97.8°F | Ht 63.0 in | Wt 238.0 lb

## 2024-04-27 DIAGNOSIS — I6523 Occlusion and stenosis of bilateral carotid arteries: Secondary | ICD-10-CM | POA: Diagnosis not present

## 2024-04-27 NOTE — Telephone Encounter (Signed)
 Per BC, cardiac clearance needed prior to scheduling R CEA.  Referral sent by L. Stockard.

## 2024-04-27 NOTE — Progress Notes (Signed)
 Patient ID: Brandi Peters, female   DOB: 1950-10-29, 73 y.o.   MRN: [id number]  Reason for Consult: Follow-up   Referred by Gerome Brunet, DO  Subjective:     HPI:  Brandi Peters is a 73 y.o. female with poorly controlled diabetes states that she does not follow her blood sugar at home but that her blood sugars have never been terrible at her primary care doctor's office.  She was planned for carotid endarterectomy last year but was canceled due to hyperglycemia at the time of surgery.  She is followed closely by Dr. Ladona underwent repeat carotid duplex in February which redemonstrated her high-grade stenosis of the right ICA.  She remains asymptomatic without history of stroke, TIA or amaurosis.  Past Medical History:  Diagnosis Date   Allergy    Anxiety    Arthritis    Carotid artery occlusion    Diabetes mellitus without complication (HCC)    Fibromyalgia    GERD (gastroesophageal reflux disease)    Gout    Heart murmur    HTN (hypertension) 03/24/2019   Hyperlipemia 03/24/2019   Type 2 diabetes mellitus with complication, without long-term current use of insulin (HCC) 03/24/2019   Family History  Problem Relation Age of Onset   Heart disease Mother    Hypertension Mother    Hypertension Father    Hyperlipidemia Father    Heart disease Brother    Hypertension Brother    Hypertension Brother    Past Surgical History:  Procedure Laterality Date   ABDOMINAL HYSTERECTOMY     BREAST BIOPSY Left 06/23/2012   BREAST EXCISIONAL BIOPSY Left a long time ago per pt   no visible scar   CHOLECYSTECTOMY     JOINT REPLACEMENT Left    shoulder    Short Social History:  Social History   Tobacco Use   Smoking status: Former    Current packs/day: 0.00    Average packs/day: 1 pack/day for 25.0 years (25.0 ttl pk-yrs)    Types: Cigarettes    Start date: 9    Quit date: 2015    Years since quitting: 10.5   Smokeless tobacco: Never  Substance Use Topics   Alcohol  use: Yes    Comment: on holidays     Allergies  Allergen Reactions   Pioglitazone Shortness Of Breath and Swelling   Metformin Hcl     diarrhea with high doses    Current Outpatient Medications  Medication Sig Dispense Refill   acetaminophen (TYLENOL) 500 MG tablet Take 1,000-1,500 mg by mouth every 8 (eight) hours as needed for moderate pain.     allopurinol (ZYLOPRIM) 100 MG tablet Take 100 mg by mouth daily.     aspirin 81 MG chewable tablet Chew 81 mg by mouth daily.     azelastine  (ASTELIN ) 0.1 % nasal spray Place 2 sprays into both nostrils 2 (two) times daily. Use in each nostril as directed 30 mL 12   Carboxymethylcellulose Sodium (REFRESH TEARS OP) Place 1 drop into both eyes daily as needed (dry eyes).     clopidogrel  (PLAVIX ) 75 MG tablet TAKE 1 TABLET (75 MG TOTAL) BY MOUTH DAILY. 90 tablet 3   empagliflozin  (JARDIANCE ) 25 MG TABS tablet Take 1 tablet (25 mg total) by mouth daily before breakfast. 90 tablet 3   ezetimibe  (ZETIA ) 10 MG tablet TAKE 1 TABLET (10 MG TOTAL) BY MOUTH EVERY EVENING. 90 tablet 3   famotidine  (PEPCID ) 20 MG tablet Take 1 tablet (20 mg  total) by mouth 2 (two) times daily. 30 tablet 1   furosemide (LASIX) 40 MG tablet Take 40 mg by mouth as needed for edema.     ipratropium (ATROVENT ) 0.03 % nasal spray Place 2 sprays into both nostrils every 12 (twelve) hours. 30 mL 12   labetalol (NORMODYNE) 300 MG tablet Take 300 mg by mouth 2 (two) times daily.     levocetirizine (XYZAL  ALLERGY 24HR) 5 MG tablet Take 1 tablet (5 mg total) by mouth every evening. 30 tablet 3   linaclotide (LINZESS) 145 MCG CAPS capsule Take 145 mcg by mouth daily as needed (constipation).     losartan-hydrochlorothiazide (HYZAAR) 100-25 MG tablet Take 1 tablet by mouth daily.     PARoxetine (PAXIL) 20 MG tablet Take 20 mg by mouth daily.     rosuvastatin  (CRESTOR ) 20 MG tablet Take 20 mg by mouth daily.     Semaglutide,0.25 or 0.5MG /DOS, (OZEMPIC, 0.25 OR 0.5 MG/DOSE,) 2 MG/1.5ML  SOPN Inject 1 mg into the skin every Wednesday.     TRESIBA FLEXTOUCH 200 UNIT/ML FlexTouch Pen Inject 15 Units into the skin daily.     Vitamin D, Ergocalciferol, (DRISDOL) 1.25 MG (50000 UNIT) CAPS capsule Take 50,000 Units by mouth every Wednesday.     No current facility-administered medications for this visit.    Review of Systems  Constitutional:  Constitutional negative. HENT: HENT negative.  Eyes: Eyes negative.  Respiratory: Respiratory negative.  Cardiovascular: Positive for leg swelling.  GI: Gastrointestinal negative.  Musculoskeletal: Positive for gait problem.  Skin: Skin negative.  Hematologic: Hematologic/lymphatic negative.  Psychiatric: Psychiatric negative.        Objective:  Objective   Vitals:   04/27/24 0906 04/27/24 0908  BP: 128/69 128/77  Pulse: 65   Temp: 97.8 F (36.6 C)   SpO2: 95%   Weight: 238 lb (108 kg)   Height: 5' 3 (1.6 m)    Body mass index is 42.16 kg/m.  Physical Exam HENT:     Head: Normocephalic.     Nose: Nose normal.  Eyes:     Pupils: Pupils are equal, round, and reactive to light.  Neck:     Vascular: Carotid bruit present.  Cardiovascular:     Heart sounds: Murmur heard.  Pulmonary:     Effort: Pulmonary effort is normal.  Abdominal:     General: Abdomen is flat.  Musculoskeletal:     Right lower leg: Edema present.     Left lower leg: Edema present.  Skin:    General: Skin is warm and dry.     Capillary Refill: Capillary refill takes less than 2 seconds.  Neurological:     General: No focal deficit present.     Mental Status: She is alert.  Psychiatric:        Mood and Affect: Mood normal.     Data: Right Carotid Findings:  +----------+--------+--------+--------+------------------+----------+           PSV cm/sEDV cm/sStenosisPlaque DescriptionComments    +----------+--------+--------+--------+------------------+----------+  CCA Prox  59      7                                 tortuous     +----------+--------+--------+--------+------------------+----------+  CCA Distal42      11                                            +----------+--------+--------+--------+------------------+----------+  ICA Prox  474     167     80-99%  calcific                      +----------+--------+--------+--------+------------------+----------+  ICA Mid   115     39              heterogenous      steep dive  +----------+--------+--------+--------+------------------+----------+  ICA Distal51      14                                            +----------+--------+--------+--------+------------------+----------+  ECA      547     191     >50%    calcific                      +----------+--------+--------+--------+------------------+----------+   +----------+--------+-------+----------------+-------------------+           PSV cm/sEDV cmsDescribe        Arm Pressure (mmHG)  +----------+--------+-------+----------------+-------------------+  [id number]           Multiphasic, TWO840                  +----------+--------+-------+----------------+-------------------+   +---------+--------+--+--------+--+---------+  VertebralPSV cm/s56EDV cm/s13Antegrade  +---------+--------+--+--------+--+---------+     Left Carotid Findings:  +----------+--------+--------+--------+------------------+---------+           PSV cm/sEDV cm/sStenosisPlaque DescriptionComments   +----------+--------+--------+--------+------------------+---------+  CCA Prox  108     11                                           +----------+--------+--------+--------+------------------+---------+  CCA Distal62      10                                           +----------+--------+--------+--------+------------------+---------+  ICA Prox  153     30      1-39%   calcific          Shadowing  +----------+--------+--------+--------+------------------+---------+   ICA Mid   98      19              calcific                     +----------+--------+--------+--------+------------------+---------+  ICA Distal95      22                                           +----------+--------+--------+--------+------------------+---------+  ECA      127     6               calcific                     +----------+--------+--------+--------+------------------+---------+   +----------+--------+--------+----------------+-------------------+           PSV cm/sEDV cm/sDescribe        Arm Pressure (mmHG)  +----------+--------+--------+----------------+-------------------+  [id number]            Multiphasic, TWO847                  +----------+--------+--------+----------------+-------------------+   +---------+--------+--+--------+--+---------+  VertebralPSV cm/s98EDV cm/s21Antegrade  +---------+--------+--+--------+--+---------+         Summary:  Right Carotid: Velocities in the right ICA are consistent with a 80-99%                 stenosis. The ECA appears >50% stenosed.   Left Carotid: Velocities in the left ICA are consistent with a 1-39%  stenosis.   Vertebrals: Bilateral vertebral arteries demonstrate antegrade flow.  Subclavians: Normal flow hemodynamics were seen in bilateral subclavian               arteries.      Assessment/Plan:    73 year old female with known high-grade carotid stenosis on the right which is heavily calcified.  Carotid endarterectomy was scheduled last year but canceled secondary to hyperglycemia the day of surgery.  She is planned to have her blood sugar checked in the next couple weeks with her primary care doctor.  She is also followed closely by Dr. Ladona.  We have again discussed her options which include carotid endarterectomy versus continued medical management.  We again reviewed her CT scan from last May and she does not appear to be a candidate for stenting either from a  transfemoral transcarotid route given the high degree of calcification.  All questions were answered and she is going to call if she wishes to proceed with right carotid endarterectomy.     Penne Lonni Colorado MD [facility]

## 2024-05-20 ENCOUNTER — Ambulatory Visit (HOSPITAL_COMMUNITY)
Admission: RE | Admit: 2024-05-20 | Discharge: 2024-05-20 | Disposition: A | Discharge status: Home / Self Care | Source: Ambulatory Visit | Attending: Cardiology | Admitting: Cardiology

## 2024-05-20 DIAGNOSIS — I6521 Occlusion and stenosis of right carotid artery: Secondary | ICD-10-CM | POA: Diagnosis not present

## 2024-05-24 NOTE — Progress Notes (Signed)
 Previously CT angio did not give accurate results, now US  is the same. I think best option is Cerebral angiogram (diagnostic only and should not hopefully take long). I can see her to schedule and not urgent

## 2024-05-25 DIAGNOSIS — I6529 Occlusion and stenosis of unspecified carotid artery: Secondary | ICD-10-CM | POA: Diagnosis not present

## 2024-05-25 DIAGNOSIS — E7800 Pure hypercholesterolemia, unspecified: Secondary | ICD-10-CM | POA: Diagnosis not present

## 2024-05-25 DIAGNOSIS — N1831 Chronic kidney disease, stage 3a: Secondary | ICD-10-CM | POA: Diagnosis not present

## 2024-05-25 DIAGNOSIS — E1159 Type 2 diabetes mellitus with other circulatory complications: Secondary | ICD-10-CM | POA: Diagnosis not present

## 2024-05-25 DIAGNOSIS — E1122 Type 2 diabetes mellitus with diabetic chronic kidney disease: Secondary | ICD-10-CM | POA: Diagnosis not present

## 2024-05-25 DIAGNOSIS — I129 Hypertensive chronic kidney disease with stage 1 through stage 4 chronic kidney disease, or unspecified chronic kidney disease: Secondary | ICD-10-CM | POA: Diagnosis not present

## 2024-05-26 NOTE — Progress Notes (Signed)
 She can follow up with him and see what he says. He can do cerebral angio or I can if indicated

## 2024-05-31 ENCOUNTER — Telehealth: Payer: Self-pay | Admitting: Pharmacist

## 2024-05-31 ENCOUNTER — Other Ambulatory Visit (HOSPITAL_COMMUNITY): Payer: Self-pay

## 2024-05-31 ENCOUNTER — Telehealth: Payer: Self-pay

## 2024-05-31 ENCOUNTER — Encounter: Payer: Self-pay | Admitting: Pharmacist

## 2024-05-31 ENCOUNTER — Ambulatory Visit: Attending: Cardiology | Admitting: Pharmacist

## 2024-05-31 DIAGNOSIS — E118 Type 2 diabetes mellitus with unspecified complications: Secondary | ICD-10-CM | POA: Diagnosis not present

## 2024-05-31 DIAGNOSIS — N1831 Chronic kidney disease, stage 3a: Secondary | ICD-10-CM | POA: Insufficient documentation

## 2024-05-31 DIAGNOSIS — E1151 Type 2 diabetes mellitus with diabetic peripheral angiopathy without gangrene: Secondary | ICD-10-CM | POA: Insufficient documentation

## 2024-05-31 DIAGNOSIS — I6521 Occlusion and stenosis of right carotid artery: Secondary | ICD-10-CM

## 2024-05-31 DIAGNOSIS — M109 Gout, unspecified: Secondary | ICD-10-CM | POA: Insufficient documentation

## 2024-05-31 DIAGNOSIS — E1121 Type 2 diabetes mellitus with diabetic nephropathy: Secondary | ICD-10-CM | POA: Insufficient documentation

## 2024-05-31 DIAGNOSIS — E559 Vitamin D deficiency, unspecified: Secondary | ICD-10-CM | POA: Insufficient documentation

## 2024-05-31 DIAGNOSIS — E7800 Pure hypercholesterolemia, unspecified: Secondary | ICD-10-CM | POA: Diagnosis not present

## 2024-05-31 DIAGNOSIS — R011 Cardiac murmur, unspecified: Secondary | ICD-10-CM | POA: Insufficient documentation

## 2024-05-31 DIAGNOSIS — I129 Hypertensive chronic kidney disease with stage 1 through stage 4 chronic kidney disease, or unspecified chronic kidney disease: Secondary | ICD-10-CM | POA: Insufficient documentation

## 2024-05-31 NOTE — Telephone Encounter (Signed)
 Pharmacy Patient Advocate Encounter   Received notification from Physician's Office that prior authorization for REPATHA is required/requested.   Insurance verification completed.   The patient is insured through [employer] .   Per test claim: PA required; PA submitted to above mentioned insurance via Latent Key/confirmation #/EOC [id number] Status is pending

## 2024-05-31 NOTE — Telephone Encounter (Signed)
 Pharmacy Patient Advocate Encounter   Received notification from Physician's Office that prior authorization for MOUNJARO is required/requested.   Insurance verification completed.   The patient is insured through [employer] .   Per test claim: The current 28 day co-pay is, $379.25.  No PA needed at this time. This test claim was processed through [facility]- copay amounts may vary at other pharmacies due to pharmacy/plan contracts, or as the patient moves through the different stages of their insurance plan.    FILL WILL MEET DEDUCTIBLE

## 2024-05-31 NOTE — Progress Notes (Signed)
 Patient ID: Brandi Peters                 DOB: [birth year]                    MRN: [id number]     HPI: Brandi Peters is a 73 y.o. female patient referred to lipid clinic by Dr Ladona. PMH is significant for carotid artery stenosis, HTN, PVD, T2DM, and HLD.  Patient presents today to discuss lipid management. Walks slowly with a cane. Often feels like she will fall over.  A1c has improved likely due to diet changes. Last A1c 7.2% on 05/26/24. Albumin/creatinine ratio <2. Husband passed away last year which she thinks was contributing to blood sugar increases. Currently on Ozempic 2mg  which has helped blood sugar control but she has not lost any weight.  Has trouble being physically active due to mobility issues. Has changed her diet and is eating zero foods (no sugar added).  Currently managed on rosuvastatin  20mg  and ezetimibe  10mg  once daily. Alk Phos slightly elevated on last CMP. Does not drink alcohol.  Has referral to Dr Brandi Peters for possible endarterectomy.    Current Medications:  Rosuvastatin  20mg  daily Ezetimibe  10,g daily  Risk Factors:  T2DM Carotid artery stenosis PVD  LDL goal: <55  Labs: TC 155, Trigs 65, HDL 57, LDL 85 (04/14/24)  Past Medical History:  Diagnosis Date   Allergy    Anxiety    Arthritis    Carotid artery occlusion    Diabetes mellitus without complication (HCC)    Fibromyalgia    GERD (gastroesophageal reflux disease)    Gout    Heart murmur    HTN (hypertension) 03/24/2019   Hyperlipemia 03/24/2019   Type 2 diabetes mellitus with complication, without long-term current use of insulin (HCC) 03/24/2019    Current Outpatient Medications on File Prior to Visit  Medication Sig Dispense Refill   augmented betamethasone dipropionate (DIPROLENE-AF) 0.05 % cream Apply 1 Application topically 2 (two) times daily as needed.     acetaminophen (TYLENOL) 500 MG tablet Take 1,000-1,500 mg by mouth every 8 (eight) hours as needed for moderate pain.      allopurinol (ZYLOPRIM) 100 MG tablet Take 100 mg by mouth daily.     aspirin 81 MG chewable tablet Chew 81 mg by mouth daily.     azelastine  (ASTELIN ) 0.1 % nasal spray Place 2 sprays into both nostrils 2 (two) times daily. Use in each nostril as directed 30 mL 12   Carboxymethylcellulose Sodium (REFRESH TEARS OP) Place 1 drop into both eyes daily as needed (dry eyes).     clopidogrel  (PLAVIX ) 75 MG tablet TAKE 1 TABLET (75 MG TOTAL) BY MOUTH DAILY. 90 tablet 3   empagliflozin  (JARDIANCE ) 25 MG TABS tablet Take 1 tablet (25 mg total) by mouth daily before breakfast. 90 tablet 3   ezetimibe  (ZETIA ) 10 MG tablet TAKE 1 TABLET (10 MG TOTAL) BY MOUTH EVERY EVENING. 90 tablet 3   famotidine  (PEPCID ) 20 MG tablet Take 1 tablet (20 mg total) by mouth 2 (two) times daily. 30 tablet 1   furosemide (LASIX) 40 MG tablet Take 40 mg by mouth as needed for edema.     ipratropium (ATROVENT ) 0.03 % nasal spray Place 2 sprays into both nostrils every 12 (twelve) hours. 30 mL 12   labetalol (NORMODYNE) 300 MG tablet Take 300 mg by mouth 2 (two) times daily.     levocetirizine (XYZAL  ALLERGY 24HR) 5 MG tablet Take  1 tablet (5 mg total) by mouth every evening. 30 tablet 3   linaclotide (LINZESS) 145 MCG CAPS capsule Take 145 mcg by mouth daily as needed (constipation).     losartan-hydrochlorothiazide (HYZAAR) 100-25 MG tablet Take 1 tablet by mouth daily.     PARoxetine (PAXIL) 20 MG tablet Take 20 mg by mouth daily.     rosuvastatin  (CRESTOR ) 20 MG tablet Take 20 mg by mouth daily.     Semaglutide,0.25 or 0.5MG /DOS, (OZEMPIC, 0.25 OR 0.5 MG/DOSE,) 2 MG/1.5ML SOPN Inject 1 mg into the skin every Wednesday.     TRESIBA FLEXTOUCH 200 UNIT/ML FlexTouch Pen Inject 15 Units into the skin daily.     Vitamin D, Ergocalciferol, (DRISDOL) 1.25 MG (50000 UNIT) CAPS capsule Take 50,000 Units by mouth every Wednesday.     No current facility-administered medications on file prior to visit.    Allergies  Allergen Reactions    Pioglitazone Shortness Of Breath and Swelling   Metformin Hcl     diarrhea with high doses    Assessment/Plan:  1. Hyperlipidemia - Patient's last LDL of 85 is above goal of <55 despite rosuvastatin  and ezetimibe . Due to carotid artery stenosis, recommend addition of PCSK9i or Leqvio.   Using PCSK9i demo pen, educated on mechanism of action, storage, site selection, administration and possible adverse effects. Will complete PA and contact patient with result.  Insurance plan appears to prefer Leqvio. Discussed administration and dosing schedule. Had patient sign start form and will fax to service center.  Per patient request, will run PA for Mounjaro for assistance with weight loss.  Will send message to social work team about assistance with a cane.  Chris Jordyn Hofacker, PharmD, BCACP, CDCES, CPP [facility] [address], [place], [place] [address] Phone: [phone]; Fax: [phone] 05/31/2024 8:15 AM

## 2024-05-31 NOTE — Telephone Encounter (Signed)
Please complete PA for Repatha and [facility]

## 2024-05-31 NOTE — Patient Instructions (Addendum)
 It was nice meeting you today  We would like your LDL (bad cholesterol) to be less than 55  Please continue your rosuvastatin  20mg  and ezetimibe  10mg  once a day along with your other medications  I will complete prior authorizations for Repatha (injection you do at home every 2 weeks) and Leqvio (injection given by a nurse) and I will call you with the results  I will also see if your plan covers Mounjaro  After you start either the Repatha or Leqvio we will repeat your fasting lipid panel in about 3 months  Please send a message or call with any questions  Medford Bolk, PharmD, BCACP, CDCES, CPP [facility] [address] [place], [place] [address] Phone: [phone]; Fax: [phone] 05/31/2024 7:56 AM

## 2024-05-31 NOTE — Telephone Encounter (Signed)
 Pharmacy Patient Advocate Encounter  Received notification from [employer] that Prior Authorization for REPATHA  has been APPROVED from 10/21/23 to 10/19/24

## 2024-05-31 NOTE — Telephone Encounter (Signed)
 Leqvio start form and insurance info sent to service center

## 2024-06-01 ENCOUNTER — Other Ambulatory Visit (INDEPENDENT_AMBULATORY_CARE_PROVIDER_SITE_OTHER): Payer: Self-pay | Admitting: Otolaryngology

## 2024-06-01 ENCOUNTER — Telehealth (HOSPITAL_COMMUNITY): Payer: Self-pay | Admitting: Licensed Clinical Social Worker

## 2024-06-01 NOTE — Telephone Encounter (Signed)
 H&V Care Navigation CSW Progress Note  Clinical Social Worker consulted to assist with obtaining patient a cane.  CSW spoke with pt who states she is looking for a program to get a cane through.  CSW explained that a cane would likely be something covered through insurance and that she should reach out to her PCP to help evaluate her for appropriate type of cane and to place an order to a DME agency- she expressed understanding and will plan to reach out to PCP to discuss further.   SDOH Screenings   Food Insecurity: No Food Insecurity (05/19/2023)  Housing: Low Risk  (05/19/2023)  Transportation Needs: No Transportation Needs (05/19/2023)  Utilities: Not At Risk (05/19/2023)  Depression (PHQ2-9): Low Risk  (09/10/2023)  Financial Resource Strain: Low Risk  (05/19/2023)  Social Connections: Moderately Integrated (05/19/2023)  Tobacco Use: Medium Risk (05/31/2024)  Health Literacy: Adequate Health Literacy (05/19/2023)   Brandi Peters Leech, LCSW Clinical Social Worker [facility] Desk#: [phone] Cell#: [phone]

## 2024-06-02 ENCOUNTER — Other Ambulatory Visit (HOSPITAL_COMMUNITY): Payer: Self-pay

## 2024-06-02 MED ORDER — ROSUVASTATIN CALCIUM 40 MG PO TABS: 40.0000 mg | ORAL_TABLET | Freq: Every day | ORAL | 3 refills | Status: AC

## 2024-06-02 NOTE — Telephone Encounter (Signed)
 Patient called back. Has a deductible on her prescription and medical plans. She is unable to afford right now.   Repatha currently 283 dollars, Leqvio 257, Mounjaro 379.25. Advised I could try to get her a grant for Repatha/Leqvio. Patient declines for now and wants to discuss with Dr Ladona next month.  Recommended in the meantime to at least increase rosuvastatin  and patient is agreeable. Will increase rosuvastatin  to 40mg  once daily.

## 2024-06-02 NOTE — Telephone Encounter (Signed)
 Attempted to call patient to discuss Repatha and Leqvio. No answer, no machine picked up

## 2024-06-09 DIAGNOSIS — M109 Gout, unspecified: Secondary | ICD-10-CM | POA: Diagnosis not present

## 2024-06-09 DIAGNOSIS — I6529 Occlusion and stenosis of unspecified carotid artery: Secondary | ICD-10-CM | POA: Diagnosis not present

## 2024-06-09 DIAGNOSIS — E7800 Pure hypercholesterolemia, unspecified: Secondary | ICD-10-CM | POA: Diagnosis not present

## 2024-06-09 DIAGNOSIS — I350 Nonrheumatic aortic (valve) stenosis: Secondary | ICD-10-CM | POA: Diagnosis not present

## 2024-06-09 DIAGNOSIS — I129 Hypertensive chronic kidney disease with stage 1 through stage 4 chronic kidney disease, or unspecified chronic kidney disease: Secondary | ICD-10-CM | POA: Diagnosis not present

## 2024-06-09 DIAGNOSIS — I351 Nonrheumatic aortic (valve) insufficiency: Secondary | ICD-10-CM | POA: Diagnosis not present

## 2024-06-09 DIAGNOSIS — E1159 Type 2 diabetes mellitus with other circulatory complications: Secondary | ICD-10-CM | POA: Diagnosis not present

## 2024-06-09 DIAGNOSIS — N1831 Chronic kidney disease, stage 3a: Secondary | ICD-10-CM | POA: Diagnosis not present

## 2024-06-09 DIAGNOSIS — E1122 Type 2 diabetes mellitus with diabetic chronic kidney disease: Secondary | ICD-10-CM | POA: Diagnosis not present

## 2024-06-09 DIAGNOSIS — Z0000 Encounter for general adult medical examination without abnormal findings: Secondary | ICD-10-CM | POA: Diagnosis not present

## 2024-06-14 NOTE — Progress Notes (Signed)
 Compared to the study done on 06/12/2024, high-grade 80 to 99% stenosis on the right ICA could not be documented, previous velocity 470/167 cm/s similar to 02/04/2023 study.  CT angiogram of the neck on 03/17/2023 revealing severe stenosis of the right CCA bifurcation due to bulky calcified plaque and lumen is unmeasurable.  Given the above findings, although carotid artery duplex on 05/20/2024 is inadequate to evaluate her right asymptomatic carotid stenosis, would recommend proceeding with carotid endarterectomy as recommended by Dr. Sheree.  Penne, please review the chart and you had already recommended CEA as she is not a stent candidate. Let me know if you plan to see her again or go ahead and schedule her for surgery.

## 2024-06-14 NOTE — Progress Notes (Signed)
 She has had recent nuclear stress which was low risk. Please proceed with surgery with low risk from cardiac standpoint

## 2024-06-15 ENCOUNTER — Telehealth: Payer: Self-pay

## 2024-06-15 NOTE — Telephone Encounter (Signed)
 Per conversation between Dr. Ladona and Dr. Sheree, both are in agreement to clear patient for R CEA with Dr. Sheree.

## 2024-06-15 NOTE — Telephone Encounter (Signed)
 This nurse spoke to patient who wants to come in to discuss R CEA with Dr. Sheree again.  Message sent to office schedulers.  Drs. Sheree and Campbell informed.

## 2024-06-23 ENCOUNTER — Other Ambulatory Visit: Payer: Self-pay | Admitting: Family Medicine

## 2024-06-23 DIAGNOSIS — Z1231 Encounter for screening mammogram for malignant neoplasm of breast: Secondary | ICD-10-CM

## 2024-07-01 DIAGNOSIS — Z23 Encounter for immunization: Secondary | ICD-10-CM | POA: Diagnosis not present

## 2024-07-13 ENCOUNTER — Other Ambulatory Visit: Payer: Self-pay

## 2024-07-13 ENCOUNTER — Ambulatory Visit: Attending: Vascular Surgery | Admitting: Vascular Surgery

## 2024-07-13 ENCOUNTER — Ambulatory Visit: Admitting: Vascular Surgery

## 2024-07-13 ENCOUNTER — Encounter: Payer: Self-pay | Admitting: Vascular Surgery

## 2024-07-13 VITALS — BP 167/71 | HR 93 | Ht 63.0 in | Wt 244.0 lb

## 2024-07-13 DIAGNOSIS — I6523 Occlusion and stenosis of bilateral carotid arteries: Secondary | ICD-10-CM

## 2024-07-13 NOTE — Progress Notes (Signed)
 Patient ID: Brandi Peters, female   DOB: 1951/03/31, 73 y.o.   MRN: [id number]  Reason for Consult: Follow-up   Referred by Brandi Brunet, DO  Subjective:     HPI:  Brandi Peters is a 73 y.o. female history of high-grade right ICA stenosis which remains asymptomatic without history of stroke, TIA or amaurosis.  She has hypertension hyperlipidemia and type 2 diabetes as risk factors.  She was planned for right carotid endarterectomy but canceled at the time of surgery due to uncontrolled blood glucose.  She now follows up for surgical discussion.  She has many questions and is admittedly nervous about any surgical intervention.  She is taking aspirin, Plavix  and a statin.  Past Medical History:  Diagnosis Date   Allergy    Anxiety    Arthritis    Carotid artery occlusion    Diabetes mellitus without complication (HCC)    Fibromyalgia    GERD (gastroesophageal reflux disease)    Gout    Heart murmur    HTN (hypertension) 03/24/2019   Hyperlipemia 03/24/2019   Type 2 diabetes mellitus with complication, without long-term current use of insulin (HCC) 03/24/2019   Family History  Problem Relation Age of Onset   Heart disease Mother    Hypertension Mother    Hypertension Father    Hyperlipidemia Father    Heart disease Brother    Hypertension Brother    Hypertension Brother    Past Surgical History:  Procedure Laterality Date   ABDOMINAL HYSTERECTOMY     BREAST BIOPSY Left 06/23/2012   BREAST EXCISIONAL BIOPSY Left a long time ago per pt   no visible scar   CHOLECYSTECTOMY     JOINT REPLACEMENT Left    shoulder    Short Social History:  Social History   Tobacco Use   Smoking status: Former    Current packs/day: 0.00    Average packs/day: 1 pack/day for 25.0 years (25.0 ttl pk-yrs)    Types: Cigarettes    Start date: 44    Quit date: 2015    Years since quitting: 10.7   Smokeless tobacco: Never  Substance Use Topics   Alcohol use: Yes    Comment: on  holidays     Allergies  Allergen Reactions   Pioglitazone Shortness Of Breath and Swelling   Metformin Hcl     diarrhea with high doses    Current Outpatient Medications  Medication Sig Dispense Refill   acetaminophen (TYLENOL) 500 MG tablet Take 1,000-1,500 mg by mouth every 8 (eight) hours as needed for moderate pain.     allopurinol (ZYLOPRIM) 100 MG tablet Take 100 mg by mouth daily.     aspirin 81 MG chewable tablet Chew 81 mg by mouth daily.     augmented betamethasone dipropionate (DIPROLENE-AF) 0.05 % cream Apply 1 Application topically 2 (two) times daily as needed.     azelastine  (ASTELIN ) 0.1 % nasal spray Place 2 sprays into both nostrils 2 (two) times daily. Use in each nostril as directed 30 mL 12   Carboxymethylcellulose Sodium (REFRESH TEARS OP) Place 1 drop into both eyes daily as needed (dry eyes).     clopidogrel  (PLAVIX ) 75 MG tablet TAKE 1 TABLET (75 MG TOTAL) BY MOUTH DAILY. 90 tablet 3   empagliflozin  (JARDIANCE ) 25 MG TABS tablet Take 1 tablet (25 mg total) by mouth daily before breakfast. 90 tablet 3   ezetimibe  (ZETIA ) 10 MG tablet TAKE 1 TABLET (10 MG TOTAL) BY MOUTH EVERY EVENING.  90 tablet 3   famotidine  (PEPCID ) 20 MG tablet Take 1 tablet (20 mg total) by mouth 2 (two) times daily. 30 tablet 1   furosemide (LASIX) 40 MG tablet Take 40 mg by mouth as needed for edema.     ipratropium (ATROVENT ) 0.03 % nasal spray Place 2 sprays into both nostrils every 12 (twelve) hours. 30 mL 12   labetalol (NORMODYNE) 300 MG tablet Take 300 mg by mouth 2 (two) times daily.     levocetirizine (XYZAL ) 5 MG tablet TAKE 1 TABLET EVERY EVENING 90 tablet 3   linaclotide (LINZESS) 145 MCG CAPS capsule Take 145 mcg by mouth daily as needed (constipation).     losartan-hydrochlorothiazide (HYZAAR) 100-25 MG tablet Take 1 tablet by mouth daily.     PARoxetine (PAXIL) 20 MG tablet Take 20 mg by mouth daily.     rosuvastatin  (CRESTOR ) 40 MG tablet Take 1 tablet (40 mg total) by mouth  daily. 90 tablet 3   Semaglutide, 2 MG/DOSE, (OZEMPIC, 2 MG/DOSE,) 8 MG/3ML SOPN Inject 2 mg into the skin once a week.     TRESIBA FLEXTOUCH 200 UNIT/ML FlexTouch Pen Inject 25 Units into the skin daily.     Vitamin D, Ergocalciferol, (DRISDOL) 1.25 MG (50000 UNIT) CAPS capsule Take 50,000 Units by mouth every Wednesday.     No current facility-administered medications for this visit.    Review of Systems  Constitutional:  Constitutional negative. HENT: HENT negative.  Eyes: Eyes negative.  Respiratory: Respiratory negative.  Cardiovascular: Cardiovascular negative.  GI: Gastrointestinal negative.  Musculoskeletal: Musculoskeletal negative.  Skin: Skin negative.  Neurological: Neurological negative. Hematologic: Hematologic/lymphatic negative.  Psychiatric: Psychiatric negative.        Objective:  Objective   Vitals:   07/13/24 1434 07/13/24 1436  BP: (!) 153/68 (!) 167/71  Pulse: 93   SpO2: 95%   Weight: 244 lb (110.7 kg)   Height: 5' 3 (1.6 m)    Body mass index is 43.22 kg/m.  Physical Exam HENT:     Head: Normocephalic.     Nose: Nose normal.  Eyes:     Pupils: Pupils are equal, round, and reactive to light.  Neck:     Vascular: Carotid bruit present.  Cardiovascular:     Rate and Rhythm: Normal rate.     Pulses: Normal pulses.  Pulmonary:     Effort: Pulmonary effort is normal.  Abdominal:     General: Abdomen is flat.  Musculoskeletal:     Right lower leg: Edema present.     Left lower leg: Edema present.  Skin:    Capillary Refill: Capillary refill takes less than 2 seconds.  Neurological:     General: No focal deficit present.     Mental Status: She is alert.  Psychiatric:        Mood and Affect: Mood normal.     Data: Right Carotid Findings:  +----------+--------+--------+--------+------------------+-----------------  -+           PSV cm/sEDV cm/sStenosisPlaque DescriptionComments              +----------+--------+--------+--------+------------------+-----------------  -+  CCA Prox  47      8                                 tortuous             +----------+--------+--------+--------+------------------+-----------------  -+  CCA Distal40      11  intimal  thickening  +----------+--------+--------+--------+------------------+-----------------  -+  ICA Prox  37      12              heterogenous      Shadowing            +----------+--------+--------+--------+------------------+-----------------  -+  ICA Mid   42      12                                                     +----------+--------+--------+--------+------------------+-----------------  -+  ICA Distal17      4                                                      +----------+--------+--------+--------+------------------+-----------------  -+  ECA                                                not seen             +----------+--------+--------+--------+------------------+-----------------  -+   +----------+--------+-------+----------------+-------------------+           PSV cm/sEDV cmsDescribe        Arm Pressure (mmHG)  +----------+--------+-------+----------------+-------------------+  [id number]           Multiphasic, [id number]                  +----------+--------+-------+----------------+-------------------+   +---------+--------+--+--------+--+---------+  VertebralPSV cm/s49EDV cm/s15Antegrade  +---------+--------+--+--------+--+---------+   Cannot replicate previous high velocities in the RICA. Poor visualization  past the distal CCA.   Left Carotid Findings:  +----------+--------+--------+--------+------------------+-----------------  -+           PSV cm/sEDV cm/sStenosisPlaque DescriptionComments             +----------+--------+--------+--------+------------------+-----------------  -+  CCA Prox   101     6                                                      +----------+--------+--------+--------+------------------+-----------------  -+  CCA Distal68      12                                intimal  thickening  +----------+--------+--------+--------+------------------+-----------------  -+  ICA Prox  140     31      1-39%   heterogenous      Shadowing            +----------+--------+--------+--------+------------------+-----------------  -+  ICA Mid   92      24                                                     +----------+--------+--------+--------+------------------+-----------------  -+  ICA Distal86  25                                                     +----------+--------+--------+--------+------------------+-----------------  -+  ECA      158     0               heterogenous                           +----------+--------+--------+--------+------------------+-----------------  -+   +----------+--------+--------+----------------+-------------------+           PSV cm/sEDV cm/sDescribe        Arm Pressure (mmHG)  +----------+--------+--------+----------------+-------------------+  [id number]            Multiphasic, [id number]                  +----------+--------+--------+----------------+-------------------+   +---------+--------+--+--------+--+---------+  VertebralPSV cm/s59EDV cm/s14Antegrade  +---------+--------+--+--------+--+---------+         Summary:  Right Carotid: Bifurcation not well seen, results likely inaccurate, as  unable                to replicate previously elevated velocities - suggest  alternate                testing modality. Known severe RICA stenosis by both CT and                 Duplex.   Left Carotid: Velocities in the left ICA are consistent with a 1-39%  stenosis.   Vertebrals: Bilateral vertebral arteries demonstrate antegrade flow.  Subclavians: Normal flow  hemodynamics were seen in bilateral subclavian               arteries.   CTA IMPRESSION: 1. Severe stenosis at the right common carotid bifurcation due to bulky calcified plaque. The lumen is unmeasurable given the degree of stenosis and calcified plaque. 2. Atherosclerosis elsewhere in the head and neck that does not cause flow reducing stenosis. 3. Chronic right parietal infarct which is small.      Assessment/Plan:     73 year old female with high-grade right carotid bifurcation stenosis secondary to bulky calcified plaque.  She is again here to discuss surgical intervention and has previously been canceled secondary to hyperglycemia.  She is followed closely by Dr. Ladona.  We again reviewed her CT scan and discussed her options being ongoing medical therapy versus carotid endarterectomy.  We discussed that she is not a suitable candidate for stenting due to the plaque morphology being heavily calcified.  All questions were answered patient desires to proceed later this year with right carotid endarterectomy.  We discussed the risk benefits and alternatives and she demonstrates good understanding.     Penne Lonni [place] MD [facility]

## 2024-07-18 ENCOUNTER — Ambulatory Visit: Attending: Cardiology | Admitting: Cardiology

## 2024-07-18 ENCOUNTER — Encounter: Payer: Self-pay | Admitting: Cardiology

## 2024-07-18 ENCOUNTER — Other Ambulatory Visit (HOSPITAL_COMMUNITY): Payer: Self-pay

## 2024-07-18 VITALS — BP 139/70 | HR 76 | Ht 63.0 in | Wt 242.2 lb

## 2024-07-18 DIAGNOSIS — I10 Essential (primary) hypertension: Secondary | ICD-10-CM | POA: Diagnosis not present

## 2024-07-18 DIAGNOSIS — I6521 Occlusion and stenosis of right carotid artery: Secondary | ICD-10-CM | POA: Diagnosis not present

## 2024-07-18 DIAGNOSIS — E7800 Pure hypercholesterolemia, unspecified: Secondary | ICD-10-CM | POA: Diagnosis not present

## 2024-07-18 NOTE — Progress Notes (Signed)
 Cardiology Office Note:  .   Date:  07/18/2024  ID:  Brandi, Peters 19-Oct-1951, MRN [id number] PCP: Gerome Brunet, DO  [facility] Providers Cardiologist:  Gordy Bergamo, MD   History of Present Illness: Brandi   Brandi Peters is a 73 y.o. female with uncontrolled diabetes mellitus with stage IIIa chronic kidney disease, asymptomatic right carotid artery stenosis, primary hypertension, hyperlipidemia, moderate aortic stenosis and regurgitation, fibromyalgia, morbid obesity. She has had a low risk nuclear stress test with normal EF on 03/30/2023 and normal EF by echocardiogram on 03/30/2023 revealing moderate aortic stenosis and mild aortic regurgitation and also mild to moderate mitral stenosis.   She has been evaluated by Dr. Penne Colorado on 07/13/2024 and patient plans to have carotid endarterectomy sometime in December 2025.  This is a 68-month office visit.  She was also referred for evaluation of hypercholesterolemia with Repatha, due to expense patient did not want to start the medication however we did attempt to get her assistance but patient declined for now.  She is also on Ozempic which has helped to control her diabetes well however she has not had any luck in weight loss, unfortunately Mounjaro is not covered by her insurance strangely.  Cardiac Studies relevent.    CTA IMPRESSION: 1. Severe stenosis at the right common carotid bifurcation due to bulky calcified plaque. The lumen is unmeasurable given the degree of stenosis and calcified plaque. 2. Atherosclerosis elsewhere in the head and neck that does not cause flow reducing stenosis. 3. Chronic right parietal infarct which is small.    Discussed the use of AI scribe software for clinical note transcription with the patient, who gave verbal consent to proceed.  History of Present Illness Brandi Peters is a 73 year old female with carotid stenosis who presents for pre-surgical evaluation and medication  management.  She is scheduled for carotid endarterectomy on December 19th and experiences some nervousness about the surgery.  Her cholesterol is managed with Crestor , increased to 40 mg due to cost considerations, and ezetimibe  10 mg daily. Her blood pressure remains stable at approximately 130/70 mmHg with labetalol 300 mg twice daily and losartan HCT 125 mg once daily. She uses furosemide 40 mg as needed for leg swelling.   Labs   Lab Results  Component Value Date   CHOL 155 04/14/2024   HDL 57 04/14/2024   LDLCALC 85 04/14/2024   TRIG 65 04/14/2024   CHOLHDL 2.7 04/14/2024   ROS  Review of Systems  Cardiovascular:  Positive for dyspnea on exertion (chronic and stable). Negative for chest pain and leg swelling.   Physical Exam:   VS:  BP 139/70 (BP Location: Left Arm, Patient Position: Sitting, Cuff Size: Large)   Pulse 76   Ht 5' 3 (1.6 m)   Wt 242 lb 3.2 oz (109.9 kg)   HC 12 (30.5 cm)   SpO2 96%   BMI 42.90 kg/m    Wt Readings from Last 3 Encounters:  07/18/24 242 lb 3.2 oz (109.9 kg)  07/13/24 244 lb (110.7 kg)  04/27/24 238 lb (108 kg)    BP Readings from Last 3 Encounters:  07/18/24 139/70  07/13/24 (!) 167/71  04/27/24 128/77   Physical Exam Constitutional:      Appearance: She is morbidly obese.  Neck:     Vascular: Carotid bruit (bilateral) present. No JVD.  Cardiovascular:     Rate and Rhythm: Normal rate and regular rhythm.     Pulses: Intact distal  pulses.     Heart sounds: Murmur heard.     Midsystolic murmur is present with a grade of 2/6 at the upper right sternal border.     Early diastolic murmur is present with a grade of 2/4 at the upper left sternal border.     No gallop.  Pulmonary:     Effort: Pulmonary effort is normal.     Breath sounds: Normal breath sounds.  Abdominal:     General: Bowel sounds are normal.     Palpations: Abdomen is soft.     Comments: Large pannus present  Musculoskeletal:     Right lower leg: No edema.      Left lower leg: No edema.    EKG:    EKG Interpretation Date/Time:  Monday July 18 2024 13:39:41 EDT Ventricular Rate:  74 PR Interval:  160 QRS Duration:  148 QT Interval:  424 QTC Calculation: 470 R Axis:   -27  Text Interpretation: EKG 07/18/2024: Normal sinus rhythm with sinus arrhythmia at the rate of 74 bpm, right bundle branch block.  No evidence of ischemia.  No significant change from 10/16/2023. Confirmed by Spurgeon Gancarz, Jagadeesh ([id number]) on 07/18/2024 2:11:12 PM    ASSESSMENT AND PLAN: .      ICD-10-CM   1. Primary hypertension  I10 EKG 12-Lead    2. Stenosis of right carotid artery  I65.21     3. Pure hypercholesterolemia  E78.00 Lipid panel     Assessment & Plan Right carotid artery stenosis Scheduled for carotid endarterectomy in December due to high risk of myocardial infarction or cerebrovascular accident if untreated. Surgery is recommended based on current data and scientific evidence. - Proceed with carotid endarterectomy in December - Discontinue aspirin as patient is on Plavix  to reduce risk of bleeding.  Aortic valve stenosis and regurgitation Mild aortic stenosis and mild to moderate regurgitation are well-managed with no signs of heart failure.  Hypercholesterolemia Cholesterol management is ongoing. Repatha and Leqvio were considered but are cost-prohibitive. Current regimen includes Crestor  40 mg which was increased from 20 mg daily and Ezetimibe  10 mg. - Check cholesterol levels today - Continue Crestor  40 mg - Continue Ezetimibe  10 mg  Hypertension Blood pressure is well-controlled with current medications. - Continue Labetalol 300 mg twice a day - Continue Losartan HCT 125 mg once a day  Type 2 diabetes mellitus Diabetes is well-controlled with Ozempic. Mounjaro was considered but is not covered by insurance. Clinical pharmacist will explore alternative medications that may aid in weight loss and are covered by insurance. - Consult with  clinical pharmacist for alternative diabetes medication options  Lower extremity edema Edema is managed with furosemide as needed. - Continue Furosemide 40 mg as needed   Follow up: 1 year or sooner if problems.  Signed,  Gordy Bergamo, MD, [facility] 07/18/2024, 8:31 PM [facility] [address] [place], [place] [address] Phone: [phone]. Fax:  [phone]

## 2024-07-18 NOTE — Patient Instructions (Signed)
 Medication Instructions:  No medication changes were made at this visit. Continue current regimen.   *If you need a refill on your cardiac medications before your next appointment, please call your pharmacy*  Lab Work TODAY: Lipid Panel If you have labs (blood work) drawn today and your tests are completely normal, you will receive your results only by: MyChart Message (if you have MyChart) OR A paper copy in the mail If you have any lab test that is abnormal or we need to change your treatment, we will call you to review the results.  Testing/Procedures: NONE  Follow-Up: At [facility], you and your health needs are our priority.  As part of our continuing mission to provide you with exceptional heart care, our providers are all part of one team.  This team includes your primary Cardiologist (physician) and Advanced Practice Providers or APPs (Physician Assistants and Nurse Practitioners) who all work together to provide you with the care you need, when you need it.  Your next appointment:   1 YEAR  Provider:   Gordy Bergamo, MD    We recommend signing up for the patient portal called MyChart.  Sign up information is provided on this After Visit Summary.  MyChart is used to connect with patients for Virtual Visits (Telemedicine).  Patients are able to view lab/test results, encounter notes, upcoming appointments, etc.  Non-urgent messages can be sent to your provider as well.   To learn more about what you can do with MyChart, go to [email or web address].

## 2024-08-01 ENCOUNTER — Ambulatory Visit
Admission: RE | Admit: 2024-08-01 | Discharge: 2024-08-01 | Disposition: A | Source: Ambulatory Visit | Attending: Family Medicine | Admitting: Family Medicine

## 2024-08-01 DIAGNOSIS — Z1231 Encounter for screening mammogram for malignant neoplasm of breast: Secondary | ICD-10-CM | POA: Diagnosis not present

## 2024-09-21 ENCOUNTER — Other Ambulatory Visit: Payer: Self-pay | Admitting: Cardiology

## 2024-09-21 DIAGNOSIS — E7800 Pure hypercholesterolemia, unspecified: Secondary | ICD-10-CM | POA: Diagnosis not present

## 2024-09-22 ENCOUNTER — Ambulatory Visit: Payer: Self-pay | Admitting: Cardiology

## 2024-09-22 LAB — LIPID PANEL
Chol/HDL Ratio: 2.2 ratio (ref 0.0–4.4)
Cholesterol, Total: 145 mg/dL (ref 100–199)
HDL: 66 mg/dL (ref >39)
LDL Chol Calc (NIH): 65 mg/dL (ref 0–99)
Triglycerides: 71 mg/dL (ref 0–149)
VLDL Cholesterol Cal: 14 mg/dL (ref 5–40)

## 2024-09-22 NOTE — Progress Notes (Signed)
 Normal lipids continue present medications

## 2024-09-26 DIAGNOSIS — E113293 Type 2 diabetes mellitus with mild nonproliferative diabetic retinopathy without macular edema, bilateral: Secondary | ICD-10-CM | POA: Diagnosis not present

## 2024-09-26 DIAGNOSIS — H35033 Hypertensive retinopathy, bilateral: Secondary | ICD-10-CM | POA: Diagnosis not present

## 2024-09-26 DIAGNOSIS — H35013 Changes in retinal vascular appearance, bilateral: Secondary | ICD-10-CM | POA: Diagnosis not present

## 2024-09-26 DIAGNOSIS — H3582 Retinal ischemia: Secondary | ICD-10-CM | POA: Diagnosis not present

## 2024-09-26 LAB — OPHTHALMOLOGY REPORT-SCANNED

## 2024-09-29 NOTE — Progress Notes (Shared)
 [facility] - Clinic Note  10/04/2024   CHIEF COMPLAINT Patient presents for Retina Evaluation  HISTORY OF PRESENT ILLNESS: Brandi Peters is a 73 y.o. female who presents to the clinic today for:  HPI     Retina Evaluation   In both eyes.  Associated Symptoms Glare.        Comments   Patient here for Retina Evaluation. Referred by Dr . Vivian. Patient states vision pretty good. OS has like a glare or shadow. No eye pain. Dr Vivian saw plaque. Looked like had mini stroke like in eye.  Has an appoint ment to get plaque removed from right side chorodid artery. Not using drops.       Last edited by Orval Asberry RAMAN, COA on 10/04/2024  8:18 AM.     Pt reports seeing Dr. Vivian for an annual check up; She reports a glare in OS on left side beginning a few months ago. Pt has hx of HTN and Diabetes. No hx of stroke or MI.   Referring physician: Gerome Brunet, DO [address] [place],  [place] [address]  HISTORICAL INFORMATION:  Selected notes from the MEDICAL RECORD NUMBER Referred by Dr. JAMA:  Ocular Hx- PMH-   CURRENT MEDICATIONS: Current Outpatient Medications (Ophthalmic Drugs)  Medication Sig   Carboxymethylcellulose Sodium (REFRESH TEARS OP) Place 1 drop into both eyes daily as needed (dry eyes).   No current facility-administered medications for this visit. (Ophthalmic Drugs)   Current Outpatient Medications (Other)  Medication Sig   acetaminophen  (TYLENOL ) 500 MG tablet Take 1,000-1,500 mg by mouth every 8 (eight) hours as needed for moderate pain.   allopurinol  (ZYLOPRIM ) 100 MG tablet Take 100 mg by mouth daily.   augmented betamethasone dipropionate (DIPROLENE-AF) 0.05 % cream Apply 1 Application topically 2 (two) times daily as needed (irritation).   azelastine  (ASTELIN ) 0.1 % nasal spray Place 2 sprays into both nostrils 2 (two) times daily. Use in each nostril as directed   clopidogrel  (PLAVIX ) 75 MG tablet TAKE 1 TABLET  (75 MG TOTAL) BY MOUTH DAILY.   empagliflozin  (JARDIANCE ) 25 MG TABS tablet Take 1 tablet (25 mg total) by mouth daily before breakfast.   ezetimibe  (ZETIA ) 10 MG tablet TAKE 1 TABLET EVERY EVENING   furosemide (LASIX) 40 MG tablet Take 40 mg by mouth daily as needed for edema.   ipratropium (ATROVENT ) 0.03 % nasal spray Place 2 sprays into both nostrils every 12 (twelve) hours.   labetalol  (NORMODYNE ) 300 MG tablet Take 300 mg by mouth 2 (two) times daily.   levocetirizine (XYZAL ) 5 MG tablet TAKE 1 TABLET EVERY EVENING   linaclotide  (LINZESS ) 145 MCG CAPS capsule Take 145 mcg by mouth daily as needed (constipation).   losartan-hydrochlorothiazide (HYZAAR) 100-25 MG tablet Take 1 tablet by mouth daily.   PARoxetine  (PAXIL ) 20 MG tablet Take 20 mg by mouth daily.   rosuvastatin  (CRESTOR ) 40 MG tablet Take 1 tablet (40 mg total) by mouth daily.   Semaglutide, 2 MG/DOSE, (OZEMPIC, 2 MG/DOSE,) 8 MG/3ML SOPN Inject 2 mg into the skin once a week.   TRESIBA FLEXTOUCH 200 UNIT/ML FlexTouch Pen Inject 25 Units into the skin daily.   Vitamin D, Ergocalciferol, (DRISDOL) 1.25 MG (50000 UNIT) CAPS capsule Take 50,000 Units by mouth every Wednesday.   famotidine  (PEPCID ) 20 MG tablet Take 1 tablet (20 mg total) by mouth 2 (two) times daily. (Patient not taking: Reported on 10/04/2024)   No current facility-administered medications for this  visit. (Other)   REVIEW OF SYSTEMS: ROS   Positive for: Cardiovascular, Eyes Last edited by Orval Asberry RAMAN, COA on 10/04/2024  8:18 AM.     ALLERGIES Allergies[1] PAST MEDICAL HISTORY Past Medical History:  Diagnosis Date   Allergy    Anxiety    Arthritis    Carotid artery occlusion    Diabetes mellitus without complication (HCC)    Fibromyalgia    GERD (gastroesophageal reflux disease)    Gout    Heart murmur    HTN (hypertension) 03/24/2019   Hyperlipemia 03/24/2019   Type 2 diabetes mellitus with complication, without long-term current use of  insulin  (HCC) 03/24/2019   Past Surgical History:  Procedure Laterality Date   ABDOMINAL HYSTERECTOMY     BREAST BIOPSY Left 06/23/2012   BREAST EXCISIONAL BIOPSY Left a long time ago per pt   no visible scar   CHOLECYSTECTOMY     JOINT REPLACEMENT Left    shoulder   FAMILY HISTORY Family History  Problem Relation Age of Onset   Heart disease Mother    Hypertension Mother    Hypertension Father    Hyperlipidemia Father    Heart disease Brother    Hypertension Brother    Hypertension Brother    SOCIAL HISTORY Social History[2]     OPHTHALMIC EXAM:  Base Eye Exam     Visual Acuity (Snellen - Linear)       Right Left   Dist cc 20/20 20/20 -1    Correction: Glasses         Tonometry (Tonopen, 8:13 AM)       Right Left   Pressure 11 12         Pupils       Dark Light Shape React APD   Right 3 2 Round Brisk None   Left 3 2 Round Brisk None         Visual Fields (Counting fingers)       Left Right    Full Full         Extraocular Movement       Right Left    Full, Ortho Full, Ortho         Neuro/Psych     Oriented x3: Yes   Mood/Affect: Normal         Dilation     Both eyes: 1.0% Mydriacyl @ 8:12 AM           Slit Lamp and Fundus Exam     External Exam       Right Left   External Normal Normal         Slit Lamp Exam       Right Left   Lids/Lashes Normal    Conjunctiva/Sclera White and quiet    Cornea Clear    Anterior Chamber Deep and quiet    Iris Round and Dilated    Lens Clear    Anterior Vitreous Normal            Refraction     Wearing Rx       Sphere Cylinder Axis Add   Right +0.00 +0.50 118 +2.25   Left +0.50 +0.25 092 +2.25           IMAGING AND PROCEDURES  Imaging and Procedures for 10/04/2024  OCT, Retina - OU - Both Eyes       Right Eye Quality was good. Central Foveal Thickness: 258. Progression has no prior data. Findings include normal foveal contour, no  IRF, no SRF (Partial  PVD, decreased ellipsoid signal peripherally--seen best on widefield scan).   Left Eye Quality was good. Central Foveal Thickness: 219. Progression has no prior data. Findings include normal foveal contour, no IRF, no SRF, inner retinal atrophy (Inner retinal atrophy and ellipsoid loss inferior hemisphere--partial RAO, partial PVD).   Notes *Images captured and stored on drive  Diagnosis / Impression:  OD: Partial PVD, decreased ellipsoid signal peripherally--seen best on widefield scan OS: Inner retinal atrophy and ellipsoid loss inferior hemisphere--partial RAO, partial PVD  Clinical management:  See below  Abbreviations: NFP - Normal foveal profile. CME - cystoid macular edema. PED - pigment epithelial detachment. IRF - intraretinal fluid. SRF - subretinal fluid. EZ - ellipsoid zone. ERM - epiretinal membrane. ORA - outer retinal atrophy. ORT - outer retinal tubulation. SRHM - subretinal hyper-reflective material. IRHM - intraretinal hyper-reflective material      Fluorescein  Angiography Optos (Transit OS)       **Images stored on drive**  Impression: OD:  OS:             ASSESSMENT/PLAN:   ICD-10-CM   1. Retinal artery occlusion  H34.9 OCT, Retina - OU - Both Eyes    Fluorescein  Angiography Optos (Transit OS)    2. Retinal ischemia  H35.82      1,2. Retinal Artery Occlusion, ischemia OS - Pt has a blockage in right carotid that she was told broke a part and went to the back of her eye. Pt on plavix .  - OCT shows OS: Inner retinal atrophy and ellipsoid loss inferior hemisphere--partial RAO, partial PVD - FA was attempted but unable to be performed - Exam shows atropy of inf mac, sclerosis of IT and superior arcades, inferior atrophy - Discussed possible PRP OS - f/u 2 months, DFE, OCT, attempt repeat FA transit OS  3-5. Diabetes mellitus, type 2 without retinopathy  - A1C 9.9 on 07.08.24 - The incidence, risk factors for progression, natural history and treatment  options for diabetic retinopathy  were discussed with patient.   - The need for close monitoring of blood glucose, blood pressure, and serum lipids, avoiding cigarette or any type of tobacco, and the need for long term follow up was also discussed with patient. - f/u in 1 year, sooner prn   6,7. Hypertensive retinopathy OU - discussed importance of tight BP control - monitor   8. Pseudophakia OU  - s/p CE/IOL Dr. Roz   - IOL in good position, doing well  - monitor    Ophthalmic Meds Ordered this visit:  No orders of the defined types were placed in this encounter.    No follow-ups on file.  There are no Patient Instructions on file for this visit.  Explained the diagnoses, plan, and follow up with the patient and they expressed understanding.  Patient expressed understanding of the importance of proper follow up care.  This document serves as a record of services personally performed by Redell JUDITHANN Hans, MD, PhD. It was created on their behalf by Wanda GEANNIE Keens, COT an ophthalmic technician. The creation of this record is the provider's dictation and/or activities during the visit.    Electronically signed by:  Wanda GEANNIE Keens, COT  10/04/2024 9:44 AM  This document serves as a record of services personally performed by Redell JUDITHANN Hans, MD, PhD. It was created on their behalf by Almetta Pesa, an ophthalmic technician. The creation of this record is the provider's dictation and/or activities during the visit.  Electronically signed by: Almetta Pesa, OA, 10/04/2024  9:44 AM  Redell JUDITHANN Hans, M.D., Ph.D. Diseases & Surgery of the Retina and Vitreous [facility] 10/04/2024  Abbreviations: M myopia (nearsighted); A astigmatism; H hyperopia (farsighted); P presbyopia; Mrx spectacle prescription;  CTL contact lenses; OD right eye; OS left eye; OU both eyes  XT exotropia; ET esotropia; PEK punctate epithelial keratitis; PEE punctate epithelial  erosions; DES dry eye syndrome; MGD meibomian gland dysfunction; ATs artificial tears; PFAT's preservative free artificial tears; NSC nuclear sclerotic cataract; PSC posterior subcapsular cataract; ERM epi-retinal membrane; PVD posterior vitreous detachment; RD retinal detachment; DM diabetes mellitus; DR diabetic retinopathy; NPDR non-proliferative diabetic retinopathy; PDR proliferative diabetic retinopathy; CSME clinically significant macular edema; DME diabetic macular edema; dbh dot blot hemorrhages; CWS cotton wool spot; POAG primary open angle glaucoma; C/D cup-to-disc ratio; HVF humphrey visual field; GVF goldmann visual field; OCT optical coherence tomography; IOP intraocular pressure; BRVO Branch retinal vein occlusion; CRVO central retinal vein occlusion; CRAO central retinal artery occlusion; BRAO branch retinal artery occlusion; RT retinal tear; SB scleral buckle; PPV pars plana vitrectomy; VH Vitreous hemorrhage; PRP panretinal laser photocoagulation; IVK intravitreal kenalog; VMT vitreomacular traction; MH Macular hole;  NVD neovascularization of the disc; NVE neovascularization elsewhere; AREDS age related eye disease study; ARMD age related macular degeneration; POAG primary open angle glaucoma; EBMD epithelial/anterior basement membrane dystrophy; ACIOL anterior chamber intraocular lens; IOL intraocular lens; PCIOL posterior chamber intraocular lens; Phaco/IOL phacoemulsification with intraocular lens placement; PRK photorefractive keratectomy; LASIK laser assisted in situ keratomileusis; HTN hypertension; DM diabetes mellitus; COPD chronic obstructive pulmonary disease     [1]  Allergies Allergen Reactions   Pioglitazone Shortness Of Breath and Swelling   Metformin Hcl Diarrhea    diarrhea with high doses  [2]  Social History Tobacco Use   Smoking status: Former    Current packs/day: 0.00    Average packs/day: 1 pack/day for 25.0 years (25.0 ttl pk-yrs)    Types: Cigarettes     Start date: 25    Quit date: 2015    Years since quitting: 10.9   Smokeless tobacco: Never  Vaping Use   Vaping status: Former  Substance Use Topics   Alcohol use: Yes    Comment: on holidays    Drug use: Never

## 2024-10-03 NOTE — Pre-Procedure Instructions (Signed)
 Surgical Instructions   Your procedure is scheduled on October 07, 2024. Report to [facility] Main Entrance A at 5:30 A.M., then check in with the Admitting office. Any questions or running late day of surgery: call [phone]  Questions prior to your surgery date: call [phone], Monday-Friday, 8am-4pm. If you experience any cold or flu symptoms such as cough, fever, chills, shortness of breath, etc. between now and your scheduled surgery, please notify us  at the above number.     Remember:  Do not eat or drink after midnight the night before your surgery    Take these medicines the morning of surgery with A SIP OF WATER: allopurinol (ZYLOPRIM)  azelastine  (ASTELIN ) nasal spray  clopidogrel  (PLAVIX )  ipratropium (ATROVENT ) nasal spray  labetalol (NORMODYNE)  PARoxetine (PAXIL)    May take these medicines IF NEEDED: acetaminophen (TYLENOL)  Carboxymethylcellulose Sodium (REFRESH TEARS OP)  linaclotide (LINZESS)    One week prior to surgery, STOP taking any Aspirin (unless otherwise instructed by your surgeon) Aleve, Naproxen, Ibuprofen, Motrin, Advil, Goody's, BC's, all herbal medications, fish oil, and non-prescription vitamins.   WHAT DO I DO ABOUT MY DIABETES MEDICATION?   STOP taking your empagliflozin  (JARDIANCE ) three days prior to surgery. Your last dose will be December 15th.      STOP taking your Semaglutide [facility]) one week prior to surgery. DO NOT take any doses after December 11th.   THE NIGHT BEFORE SURGERY/THE MORNING OF SURGERY, take 12 units of TRESIBA insulin.   HOW TO MANAGE YOUR DIABETES BEFORE AND AFTER SURGERY  Why is it important to control my blood sugar before and after surgery? Improving blood sugar levels before and after surgery helps healing and can limit problems. A way of improving blood sugar control is eating a healthy diet by:  Eating less sugar and carbohydrates  Increasing activity/exercise  Talking with your doctor about  reaching your blood sugar goals High blood sugars (greater than 180 mg/dL) can raise your risk of infections and slow your recovery, so you will need to focus on controlling your diabetes during the weeks before surgery. Make sure that the doctor who takes care of your diabetes knows about your planned surgery including the date and location.  How do I manage my blood sugar before surgery? Check your blood sugar at least 4 times a day, starting 2 days before surgery, to make sure that the level is not too high or low.  Check your blood sugar the morning of your surgery when you wake up and every 2 hours until you get to the Short Stay unit.  If your blood sugar is less than 70 mg/dL, you will need to treat for low blood sugar: Do not take insulin. Treat a low blood sugar (less than 70 mg/dL) with  cup of clear juice (cranberry or apple), 4 glucose tablets, OR glucose gel. Recheck blood sugar in 15 minutes after treatment (to make sure it is greater than 70 mg/dL). If your blood sugar is not greater than 70 mg/dL on recheck, call [phone] for further instructions. Report your blood sugar to the short stay nurse when you get to Short Stay.  If you are admitted to the hospital after surgery: Your blood sugar will be checked by the staff and you will probably be given insulin after surgery (instead of oral diabetes medicines) to make sure you have good blood sugar levels. The goal for blood sugar control after surgery is 80-180 mg/dL.  Do NOT Smoke (Tobacco/Vaping) for 24 hours prior to your procedure.  If you use a CPAP at night, you may bring your mask/headgear for your overnight stay.   You will be asked to remove any contacts, glasses, piercing's, hearing aid's, dentures/partials prior to surgery. Please bring cases for these items if needed.    Patients discharged the day of surgery will not be allowed to drive home, and someone needs to stay with them for 24  hours.  SURGICAL WAITING ROOM VISITATION Patients may have no more than 2 support people in the waiting area - these visitors may rotate.   Pre-op nurse will coordinate an appropriate time for 1 ADULT support person, who may not rotate, to accompany patient in pre-op.  Children under the age of 74 must have an adult with them who is not the patient and must remain in the main waiting area with an adult.  If the patient needs to stay at the hospital during part of their recovery, the visitor guidelines for inpatient rooms apply.  Please refer to the [facility] website for the visitor guidelines for any additional information.   If you received a COVID test during your pre-op visit  it is requested that you wear a mask when out in public, stay away from anyone that may not be feeling well and notify your surgeon if you develop symptoms. If you have been in contact with anyone that has tested positive in the last 10 days please notify you surgeon.      Pre-operative CHG Bathing Instructions   You can play a key role in reducing the risk of infection after surgery. Your skin needs to be as free of germs as possible. You can reduce the number of germs on your skin by washing with CHG (chlorhexidine  gluconate) soap before surgery. CHG is an antiseptic soap that kills germs and continues to kill germs even after washing.   DO NOT use if you have an allergy to chlorhexidine /CHG or antibacterial soaps. If your skin becomes reddened or irritated, stop using the CHG and notify one of our RNs at [phone].              TAKE A SHOWER THE NIGHT BEFORE SURGERY   Please keep in mind the following:  DO NOT shave, including legs and underarms, 48 hours prior to surgery.   You may shave your face before/day of surgery.  Place clean sheets on your bed the night before surgery Use a clean washcloth (not used since being washed) for shower. DO NOT sleep with pet's night before surgery.  CHG Shower  Instructions:  Wash your face and private area with normal soap. If you choose to wash your hair, wash first with your normal shampoo.  After you use shampoo/soap, rinse your hair and body thoroughly to remove shampoo/soap residue.  Turn the water OFF and apply half the bottle of CHG soap to a CLEAN washcloth.  Apply CHG soap ONLY FROM YOUR NECK DOWN TO YOUR TOES (washing for 3-5 minutes)  DO NOT use CHG soap on face, private areas, open wounds, or sores.  Pay special attention to the area where your surgery is being performed.  If you are having back surgery, having someone wash your back for you may be helpful. Wait 2 minutes after CHG soap is applied, then you may rinse off the CHG soap.  Pat dry with a clean towel  Put on clean pajamas    Additional instructions for the day  of surgery: If you choose, you may shower the morning of surgery with an antibacterial soap.  DO NOT APPLY any lotions, deodorants, cologne, or perfumes.   Do not wear jewelry or makeup Do not wear nail polish, gel polish, artificial nails, or any other type of covering on natural nails (fingers and toes) Do not bring valuables to the hospital. [facility] is not responsible for valuables/personal belongings. Put on clean/comfortable clothes.  Please brush your teeth.  Ask your nurse before applying any prescription medications to the skin.

## 2024-10-04 ENCOUNTER — Other Ambulatory Visit: Payer: Self-pay

## 2024-10-04 ENCOUNTER — Encounter (HOSPITAL_COMMUNITY): Payer: Self-pay

## 2024-10-04 ENCOUNTER — Ambulatory Visit (INDEPENDENT_AMBULATORY_CARE_PROVIDER_SITE_OTHER): Admitting: Ophthalmology

## 2024-10-04 ENCOUNTER — Encounter (INDEPENDENT_AMBULATORY_CARE_PROVIDER_SITE_OTHER): Payer: Self-pay | Admitting: Ophthalmology

## 2024-10-04 ENCOUNTER — Inpatient Hospital Stay (HOSPITAL_COMMUNITY): Admission: RE | Admit: 2024-10-04 | Discharge: 2024-10-04 | Attending: Vascular Surgery

## 2024-10-04 VITALS — BP 159/79 | HR 80

## 2024-10-04 VITALS — BP 153/54 | HR 84 | Temp 98.4°F | Resp 18 | Ht 63.0 in | Wt 240.4 lb

## 2024-10-04 DIAGNOSIS — I352 Nonrheumatic aortic (valve) stenosis with insufficiency: Secondary | ICD-10-CM | POA: Insufficient documentation

## 2024-10-04 DIAGNOSIS — H3582 Retinal ischemia: Secondary | ICD-10-CM

## 2024-10-04 DIAGNOSIS — F419 Anxiety disorder, unspecified: Secondary | ICD-10-CM | POA: Insufficient documentation

## 2024-10-04 DIAGNOSIS — H3581 Retinal edema: Secondary | ICD-10-CM

## 2024-10-04 DIAGNOSIS — Z7985 Long-term (current) use of injectable non-insulin antidiabetic drugs: Secondary | ICD-10-CM | POA: Diagnosis not present

## 2024-10-04 DIAGNOSIS — I10 Essential (primary) hypertension: Secondary | ICD-10-CM | POA: Diagnosis not present

## 2024-10-04 DIAGNOSIS — H35033 Hypertensive retinopathy, bilateral: Secondary | ICD-10-CM

## 2024-10-04 DIAGNOSIS — E669 Obesity, unspecified: Secondary | ICD-10-CM | POA: Insufficient documentation

## 2024-10-04 DIAGNOSIS — Z6841 Body Mass Index (BMI) 40.0 and over, adult: Secondary | ICD-10-CM | POA: Insufficient documentation

## 2024-10-04 DIAGNOSIS — Z7902 Long term (current) use of antithrombotics/antiplatelets: Secondary | ICD-10-CM | POA: Insufficient documentation

## 2024-10-04 DIAGNOSIS — I129 Hypertensive chronic kidney disease with stage 1 through stage 4 chronic kidney disease, or unspecified chronic kidney disease: Secondary | ICD-10-CM | POA: Insufficient documentation

## 2024-10-04 DIAGNOSIS — E119 Type 2 diabetes mellitus without complications: Secondary | ICD-10-CM

## 2024-10-04 DIAGNOSIS — Z87891 Personal history of nicotine dependence: Secondary | ICD-10-CM | POA: Insufficient documentation

## 2024-10-04 DIAGNOSIS — Z01818 Encounter for other preprocedural examination: Secondary | ICD-10-CM

## 2024-10-04 DIAGNOSIS — I342 Nonrheumatic mitral (valve) stenosis: Secondary | ICD-10-CM | POA: Insufficient documentation

## 2024-10-04 DIAGNOSIS — I6523 Occlusion and stenosis of bilateral carotid arteries: Secondary | ICD-10-CM | POA: Insufficient documentation

## 2024-10-04 DIAGNOSIS — Z961 Presence of intraocular lens: Secondary | ICD-10-CM

## 2024-10-04 DIAGNOSIS — Z794 Long term (current) use of insulin: Secondary | ICD-10-CM | POA: Diagnosis not present

## 2024-10-04 DIAGNOSIS — E1122 Type 2 diabetes mellitus with diabetic chronic kidney disease: Secondary | ICD-10-CM | POA: Insufficient documentation

## 2024-10-04 DIAGNOSIS — Z7984 Long term (current) use of oral hypoglycemic drugs: Secondary | ICD-10-CM

## 2024-10-04 DIAGNOSIS — N183 Chronic kidney disease, stage 3 unspecified: Secondary | ICD-10-CM | POA: Insufficient documentation

## 2024-10-04 DIAGNOSIS — M109 Gout, unspecified: Secondary | ICD-10-CM | POA: Insufficient documentation

## 2024-10-04 DIAGNOSIS — H349 Unspecified retinal vascular occlusion: Secondary | ICD-10-CM | POA: Diagnosis not present

## 2024-10-04 DIAGNOSIS — Z01812 Encounter for preprocedural laboratory examination: Secondary | ICD-10-CM | POA: Insufficient documentation

## 2024-10-04 LAB — COMPREHENSIVE METABOLIC PANEL WITH GFR
ALT: 25 U/L (ref 0–44)
AST: 42 U/L — ABNORMAL HIGH (ref 15–41)
Albumin: 4.1 g/dL (ref 3.5–5.0)
Alkaline Phosphatase: 124 U/L (ref 38–126)
Anion gap: 10 (ref 5–15)
BUN: 28 mg/dL — ABNORMAL HIGH (ref 8–23)
CO2: 26 mmol/L (ref 22–32)
Calcium: 9.8 mg/dL (ref 8.9–10.3)
Chloride: 103 mmol/L (ref 98–111)
Creatinine, Ser: 1.18 mg/dL — ABNORMAL HIGH (ref 0.44–1.00)
GFR, Estimated: 48 mL/min — ABNORMAL LOW (ref >60)
Glucose, Bld: 91 mg/dL (ref 70–99)
Potassium: 4.5 mmol/L (ref 3.5–5.1)
Sodium: 140 mmol/L (ref 135–145)
Total Bilirubin: 0.5 mg/dL (ref 0.0–1.2)
Total Protein: 7.6 g/dL (ref 6.5–8.1)

## 2024-10-04 LAB — CBC
HCT: 41.2 % (ref 36.0–46.0)
Hemoglobin: 13 g/dL (ref 12.0–15.0)
MCH: 28.8 pg (ref 26.0–34.0)
MCHC: 31.6 g/dL (ref 30.0–36.0)
MCV: 91.4 fL (ref 80.0–100.0)
Platelets: 296 K/uL (ref 150–400)
RBC: 4.51 MIL/uL (ref 3.87–5.11)
RDW: 14.7 % (ref 11.5–15.5)
WBC: 7.3 K/uL (ref 4.0–10.5)
nRBC: 0 % (ref 0.0–0.2)

## 2024-10-04 LAB — APTT: aPTT: 48 s — ABNORMAL HIGH (ref 24–36)

## 2024-10-04 LAB — URINALYSIS, ROUTINE W REFLEX MICROSCOPIC
Bilirubin Urine: NEGATIVE
Glucose, UA: >=500 mg/dL — AB
Hgb urine dipstick: NEGATIVE
Ketones, ur: NEGATIVE mg/dL
Nitrite: NEGATIVE
Protein, ur: NEGATIVE mg/dL
Specific Gravity, Urine: 1.015 (ref 1.005–1.030)
pH: 6 (ref 5.0–8.0)

## 2024-10-04 LAB — URINALYSIS, MICROSCOPIC (REFLEX)

## 2024-10-04 LAB — PROTIME-INR
INR: 1.1 (ref 0.8–1.2)
Prothrombin Time: 14.8 s (ref 11.4–15.2)

## 2024-10-04 LAB — SURGICAL PCR SCREEN
MRSA, PCR: NEGATIVE
Staphylococcus aureus: NEGATIVE

## 2024-10-04 LAB — TYPE AND SCREEN
ABO/RH(D): O POS
Antibody Screen: NEGATIVE

## 2024-10-04 LAB — GLUCOSE, CAPILLARY: Glucose-Capillary: 87 mg/dL (ref 70–99)

## 2024-10-04 LAB — HEMOGLOBIN A1C
Hgb A1c MFr Bld: 6.4 % — ABNORMAL HIGH (ref 4.8–5.6)
Mean Plasma Glucose: 136.98 mg/dL

## 2024-10-04 NOTE — Progress Notes (Signed)
 PCP - Gerome Brunet Cardiologist - Ladona Heinz  PPM/ICD - denies Device Orders - n/a Rep Notified - n/a  Chest x-ray - n/a EKG - 07/18/2024 Stress Test - 03/30/2023 ECHO - 03/30/2023 Cardiac Cath -denies   Sleep Study - denies CPAP - n/a  Fasting Blood Sugar - 87 Checks Blood Sugar 0 times a day  Last dose of GLP1 agonist-  09/21/2024 GLP1 instructions: hold 1 week prior to surgery  Blood Thinner Instructions: take Plavix  on the day of surgery Aspirin Instructions: n/a  ERAS Protcol -no, NPO PRE-SURGERY Ensure or G2- n/a  COVID TEST- n/a   Anesthesia review: hx of heart murmur, HTN, CKD, DM2 ;cardiac clearance   Patient denies shortness of breath, fever, cough and chest pain at PAT appointment   All instructions explained to the patient, with a verbal understanding of the material. Patient agrees to go over the instructions while at home for a better understanding. Patient also instructed to self quarantine after being tested for COVID-19. The opportunity to ask questions was provided.

## 2024-10-04 NOTE — Progress Notes (Addendum)
 APTT is 48; U/A shows a trace of leukocytes. Alan Glance from dr. [place] office is notified.

## 2024-10-05 ENCOUNTER — Ambulatory Visit: Payer: Self-pay | Admitting: Cardiology

## 2024-10-05 NOTE — Progress Notes (Signed)
 Retinal infarct. Has tentatively been scheduled for carotid endarterectomy

## 2024-10-05 NOTE — Progress Notes (Signed)
 Anesthesia Chart Review:  Case: [id number] Date/Time: 10/07/24 0715   Procedure: ENDARTERECTOMY, CAROTID (Right)   Anesthesia type: General   Diagnosis: Bilateral carotid artery stenosis [I65.23]   Pre-op diagnosis: BL carotid stenosis   Location: [facility] OR ROOM 16 / [facility] OR   Surgeons: Sheree Penne Bruckner, MD       DISCUSSION: Patient is a 73 year old female scheduled for the above procedure. She was initially scheduled for right carotid endarterectomy on 05/05/2023, but she presented for surgery with a glucose of > 600 (A1c 9.9%). Surgery was cancelled, and she was sent to the ED. Last A1c 6.4% on 10/04/2024. 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 Dr. Ladona is aware of surgery plans.     History includes former smoker (quit 10/20/13), HTN, HLD, DM2, murmur (moderate-severe AS, mild AI, mild-moderate MS 03/30/23 echo), carotid artery disease, CKD (stage III), fibromyalgia, gout, anxiety, obesity.    Last visit with cardiologist Dr. Ladona was on 07/18/2024 for 3 month follow-up carotid disease, moderate AS, HTN, HLD. He had added Plavix  and referred her to vascular in April 2024 after 02/04/2023 carotid US  showed 80-99% RICA and 1-15% LICA. Stress test and echo had been done as part of her preoperative risk stratification. 03/30/2023 nuclear stress test was low risk. 03/21/2023 echo showed moderate concentric LVH, EF 62%, grade 1 DD, moderately dilated LA, moderate-severe AS (Vmax 3.6 m/sec, mean PG 23 mmHg, AVA 1.2 cm, AVAi 0.5 cm2/m2 by continuity equation, dimensionless index 0.37), mild AI, mild-moderate MR (Mean PG 5 mmHg, MVA 1.6 cm2 by PHT). Dr. Ladona subsequently classified her at low risk, from a cardiac standpoint (see Letters tab). However, as mentioned July 2024 surgery was cancelled after she arrived with glucose of > 600.   Since then she has ongoing follow-up with primary care and cardiology. She remained asymptomatic from her carotid disease in February 2025, so  surgery deferred then to allow further improvement in DM control. Following 05/2024 US , Dr. Ladona recommended proceeding with right CEA as recommended by Dr. Sheree. Given testing from 2024, he did not order any repeat CV testing. Per his last office visit on 07/18/2024, she had no signs of HF, no CP or edema. Stable DOE. He noted plans for right CEA in December. A1c better controlled at 6.4%. In the meantime, Dr. Ladona received documentation from [facility]. Per 09/26/2024 office visit with Frazier, Chad, OD, she was noted to have new ocular ischemic syndrome. Dr Ladona reviewed and wrote, Retinal infarct. Has tentatively been scheduled for carotid endarterectomy.  Last Ozempic 09/21/2024. At PAT advised to hold Jardiance  for 3 days prior to surgery. Also on Tresiba 25 units daily.   She is continuing Plavix  per [facility]. ASA is not on her medication list.     Anesthesia team to evaluate on the day of surgery. She will get CBG on arrival.     VS: BP (!) 153/54   Pulse 84   Temp 36.9 C   Resp 18   Ht 5' 3 (1.6 m)   Wt 109 kg   SpO2 100%   BMI 42.58 kg/m    PROVIDERS: Gerome Brunet, DO is PCP  Ladona Heinz, MD is cardiologist Sheree Penne, MD is vascular surgeon   LABS: Preoperative labs noted.  (all labs ordered are listed, but only abnormal results are displayed)  Labs Reviewed  COMPREHENSIVE METABOLIC PANEL WITH GFR - Abnormal; Notable for the following components:      Result Value   BUN 28 (*)  Creatinine, Ser 1.18 (*)    AST 42 (*)    GFR, Estimated 48 (*)    All other components within normal limits  APTT - Abnormal; Notable for the following components:   aPTT 48 (*)    All other components within normal limits  URINALYSIS, ROUTINE W REFLEX MICROSCOPIC - Abnormal; Notable for the following components:   Glucose, UA >=500 (*)    Leukocytes,Ua TRACE (*)    All other components within normal limits  HEMOGLOBIN A1C - Abnormal; Notable for the following components:    Hgb A1c MFr Bld 6.4 (*)    All other components within normal limits  URINALYSIS, MICROSCOPIC (REFLEX) - Abnormal; Notable for the following components:   Bacteria, UA RARE (*)    All other components within normal limits  SURGICAL PCR SCREEN  CBC  PROTIME-INR  GLUCOSE, CAPILLARY  TYPE AND SCREEN     IMAGES: CT Head & CTA head/neck 03/17/2023: IMPRESSION: 1. Severe stenosis at the right common carotid bifurcation due to bulky calcified plaque. The lumen is unmeasurable given the degree of stenosis and calcified plaque. 2. Atherosclerosis elsewhere in the head and neck that does not cause flow reducing stenosis. 3. Chronic right parietal infarct which is small.    EKG: EKG 07/18/2024: Normal sinus rhythm with sinus arrhythmia at the rate of 74 bpm, right bundle branch block.  No evidence of ischemia.  No significant change from 10/16/2023. Confirmed by Ganji, Jagadeesh [phone]) on 07/18/2024 2:11:12 PM     CV: US  Carotid 05/20/2024: Summary:  - Right Carotid: Bifurcation not well seen, results likely inaccurate, as unable to replicate previously elevated velocities - suggest alternate testing modality. Known severe RICA stenosis by both CT and Duplex.  - Left Carotid: Velocities in the left ICA are consistent with a 1-39% stenosis.  - Vertebrals: Bilateral vertebral arteries demonstrate antegrade flow.  - Subclavians: Normal flow hemodynamics were seen in = bilateral subclavian arteries.  - Comparison 12/14/2023: RICA velocities consistent with 80-99% stenosis. LICA velocities consistent with 1-39% LICA stenosis.   Echocardiogram 03/30/2023:  Left ventricle cavity is normal in size. Moderate concentric hypertrophy  of the left ventricle. Normal global wall motion. Normal LV systolic  function with EF 62%. Doppler evidence of grade I (impaired) diastolic  dysfunction, normal LAP.  Left atrial cavity is moderately dilated at 46.6 ml/m^2.  Trileaflet aortic valve. Moderate aortic  valve leaflet calcification.  Moderate to severe aortic stenosis. Vmax 3.6 m/sec, mean PG 23 mmHg, AVA  1.2 cm, AVAi 0.5 cm2/m2 by continuity equation, dimensionless index 0.37.  Mild (Grade I) aortic regurgitation.  Mild calcification of the mitral valve annulus and leaflets. Mild to  moderate mitral stenosis. Mean PG 5 mmHg, MVA 1.6 cm2 by PHT method at HR  71 bpm.  Normal right atrial pressure.  Previous study on 05/22/2021 reported moderate aortic stenosis.  Vmax 3.4  m/sec, mean PG 27 mmHg, AVA 1 cm2 by continuity equation. Dimensionless  index 0.35. Moderate (Grade II) aortic regurgitation. Mild mitral valve  leaflet calcification. Mild mitral valve stenosis. Mean PG 5 mmHg, MVA 2.2  cm2 by PHT method at 70 bpm. No mitral valve regurgitation.      Lexiscan  Tetrofosmin  stress test 03/30/2023: 1 Day Rest/Stress protocol. Stress EKG is non-diagnostic, as this is pharmacological stress test using Lexiscan . Small sized, mild intensity, subtle reversibility base to mid inferolateral segments ischemia in the LCx distribution cannot be ruled out.  Otherwise, normal myocardial perfusion without prior infarct. Overall accuracy is limited  due to soft tissue attenuation on raw cine images, BMI >40, images in a seated position. Calculated LVEF 61%, wall thickness preserved, no obvious regional wall motion abnormality. Prior study dated 02/24/2014 noted normal perfusion with soft tissue attenuation in the anterior and inferior wall. Low risk study.    Past Medical History:  Diagnosis Date   Allergy    Anxiety    Arthritis    Carotid artery occlusion    Chronic kidney disease    Diabetes mellitus without complication (HCC)    Fibromyalgia    GERD (gastroesophageal reflux disease)    Gout    Heart murmur    HTN (hypertension) 03/24/2019   Hyperlipemia 03/24/2019   Type 2 diabetes mellitus with complication, without long-term current use of insulin (HCC) 03/24/2019    Past Surgical  History:  Procedure Laterality Date   ABDOMINAL HYSTERECTOMY     BREAST BIOPSY Left 06/23/2012   BREAST EXCISIONAL BIOPSY Left a long time ago per pt   no visible scar   CHOLECYSTECTOMY     EYE SURGERY Bilateral    years ago   JOINT REPLACEMENT Left    shoulder    MEDICATIONS:  acetaminophen (TYLENOL) 500 MG tablet   allopurinol (ZYLOPRIM) 100 MG tablet   augmented betamethasone dipropionate (DIPROLENE-AF) 0.05 % cream   azelastine  (ASTELIN ) 0.1 % nasal spray   Carboxymethylcellulose Sodium (REFRESH TEARS OP)   clopidogrel  (PLAVIX ) 75 MG tablet   empagliflozin  (JARDIANCE ) 25 MG TABS tablet   ezetimibe  (ZETIA ) 10 MG tablet   famotidine  (PEPCID ) 20 MG tablet   furosemide (LASIX) 40 MG tablet   ipratropium (ATROVENT ) 0.03 % nasal spray   labetalol (NORMODYNE) 300 MG tablet   levocetirizine (XYZAL ) 5 MG tablet   linaclotide (LINZESS) 145 MCG CAPS capsule   losartan-hydrochlorothiazide (HYZAAR) 100-25 MG tablet   PARoxetine (PAXIL) 20 MG tablet   rosuvastatin  (CRESTOR ) 40 MG tablet   Semaglutide, 2 MG/DOSE, (OZEMPIC, 2 MG/DOSE,) 8 MG/3ML SOPN   TRESIBA FLEXTOUCH 200 UNIT/ML FlexTouch Pen   Vitamin D, Ergocalciferol, (DRISDOL) 1.25 MG (50000 UNIT) CAPS capsule   No current facility-administered medications for this encounter.     Isaiah Ruder, PA-C Surgical Short Stay/Anesthesiology [facility] Phone [phone] [facility] Phone [phone] 10/05/2024 4:31 PM

## 2024-10-05 NOTE — Anesthesia Preprocedure Evaluation (Addendum)
 Anesthesia Evaluation    Airway        Dental   Pulmonary former smoker          Cardiovascular hypertension,   Echocardiogram 03/30/2023:  Left ventricle cavity is normal in size. Moderate concentric hypertrophy  of the left ventricle. Normal global wall motion. Normal LV systolic  function with EF 62%. Doppler evidence of grade I (impaired) diastolic  dysfunction, normal LAP.  Left atrial cavity is moderately dilated at 46.6 ml/m^2.  Trileaflet aortic valve. Moderate aortic valve leaflet calcification.  Moderate to severe aortic stenosis. Vmax 3.6 m/sec, mean PG 23 mmHg, AVA  1.2 cm, AVAi 0.5 cm2/m2 by continuity equation, dimensionless index 0.37.  Mild (Grade I) aortic regurgitation.  Mild calcification of the mitral valve annulus and leaflets. Mild to  moderate mitral stenosis. Mean PG 5 mmHg, MVA 1.6 cm2 by PHT method at HR  71 bpm.  Normal right atrial pressure.     Neuro/Psych    GI/Hepatic   Endo/Other  diabetes    Renal/GU      Musculoskeletal   Abdominal   Peds  Hematology   Anesthesia Other Findings   Reproductive/Obstetrics                              Anesthesia Physical Anesthesia Plan  ASA:   Anesthesia Plan:    Post-op Pain Management:    Induction:   PONV Risk Score and Plan:   Airway Management Planned:   Additional Equipment:   Intra-op Plan:   Post-operative Plan:   Informed Consent:   Plan Discussed with:   Anesthesia Plan Comments: (See PAT note written 10/05/2024 by Akeem Heppler, PA-C. Cardiologist is Dr. Ladona.  )         Anesthesia Quick Evaluation

## 2024-10-07 ENCOUNTER — Inpatient Hospital Stay (HOSPITAL_COMMUNITY)
Admission: RE | Admit: 2024-10-07 | Discharge: 2024-10-08 | DRG: 039 | Disposition: A | Discharge status: Home / Self Care | Attending: Vascular Surgery | Admitting: Vascular Surgery

## 2024-10-07 ENCOUNTER — Encounter (HOSPITAL_COMMUNITY): Payer: Self-pay | Admitting: Vascular Surgery

## 2024-10-07 ENCOUNTER — Inpatient Hospital Stay (HOSPITAL_COMMUNITY): Payer: Self-pay | Admitting: Vascular Surgery

## 2024-10-07 ENCOUNTER — Encounter: Admission: RE | Disposition: A | Payer: Self-pay | Attending: Vascular Surgery

## 2024-10-07 ENCOUNTER — Inpatient Hospital Stay (HOSPITAL_COMMUNITY): Admitting: Anesthesiology

## 2024-10-07 DIAGNOSIS — Z8249 Family history of ischemic heart disease and other diseases of the circulatory system: Secondary | ICD-10-CM | POA: Diagnosis not present

## 2024-10-07 DIAGNOSIS — M109 Gout, unspecified: Secondary | ICD-10-CM | POA: Diagnosis present

## 2024-10-07 DIAGNOSIS — E785 Hyperlipidemia, unspecified: Secondary | ICD-10-CM | POA: Diagnosis present

## 2024-10-07 DIAGNOSIS — Z7902 Long term (current) use of antithrombotics/antiplatelets: Secondary | ICD-10-CM

## 2024-10-07 DIAGNOSIS — Z87891 Personal history of nicotine dependence: Secondary | ICD-10-CM | POA: Diagnosis not present

## 2024-10-07 DIAGNOSIS — F419 Anxiety disorder, unspecified: Secondary | ICD-10-CM | POA: Diagnosis present

## 2024-10-07 DIAGNOSIS — N1831 Chronic kidney disease, stage 3a: Secondary | ICD-10-CM | POA: Diagnosis present

## 2024-10-07 DIAGNOSIS — E119 Type 2 diabetes mellitus without complications: Secondary | ICD-10-CM

## 2024-10-07 DIAGNOSIS — Z9889 Other specified postprocedural states: Principal | ICD-10-CM

## 2024-10-07 DIAGNOSIS — Z888 Allergy status to other drugs, medicaments and biological substances status: Secondary | ICD-10-CM

## 2024-10-07 DIAGNOSIS — I6521 Occlusion and stenosis of right carotid artery: Secondary | ICD-10-CM

## 2024-10-07 DIAGNOSIS — Z79899 Other long term (current) drug therapy: Secondary | ICD-10-CM | POA: Diagnosis not present

## 2024-10-07 DIAGNOSIS — Z83438 Family history of other disorder of lipoprotein metabolism and other lipidemia: Secondary | ICD-10-CM

## 2024-10-07 DIAGNOSIS — I129 Hypertensive chronic kidney disease with stage 1 through stage 4 chronic kidney disease, or unspecified chronic kidney disease: Secondary | ICD-10-CM | POA: Diagnosis present

## 2024-10-07 DIAGNOSIS — I080 Rheumatic disorders of both mitral and aortic valves: Secondary | ICD-10-CM | POA: Diagnosis present

## 2024-10-07 DIAGNOSIS — M797 Fibromyalgia: Secondary | ICD-10-CM | POA: Diagnosis present

## 2024-10-07 DIAGNOSIS — Z7984 Long term (current) use of oral hypoglycemic drugs: Secondary | ICD-10-CM

## 2024-10-07 DIAGNOSIS — Z7985 Long-term (current) use of injectable non-insulin antidiabetic drugs: Secondary | ICD-10-CM

## 2024-10-07 DIAGNOSIS — E1122 Type 2 diabetes mellitus with diabetic chronic kidney disease: Secondary | ICD-10-CM | POA: Diagnosis present

## 2024-10-07 DIAGNOSIS — K219 Gastro-esophageal reflux disease without esophagitis: Secondary | ICD-10-CM | POA: Diagnosis present

## 2024-10-07 DIAGNOSIS — Z7982 Long term (current) use of aspirin: Secondary | ICD-10-CM

## 2024-10-07 HISTORY — PX: ENDARTERECTOMY: SHX5162

## 2024-10-07 HISTORY — PX: PATCH ANGIOPLASTY: SHX6230

## 2024-10-07 LAB — CREATININE, SERUM
Creatinine, Ser: 1.24 mg/dL — ABNORMAL HIGH (ref 0.44–1.00)
GFR, Estimated: 45 mL/min — ABNORMAL LOW

## 2024-10-07 LAB — GLUCOSE, CAPILLARY
Glucose-Capillary: 114 mg/dL — ABNORMAL HIGH (ref 70–99)
Glucose-Capillary: 119 mg/dL — ABNORMAL HIGH (ref 70–99)
Glucose-Capillary: 211 mg/dL — ABNORMAL HIGH (ref 70–99)
Glucose-Capillary: 142 mg/dL — ABNORMAL HIGH (ref 70–99)

## 2024-10-07 LAB — CBC
HCT: 33.9 % — ABNORMAL LOW (ref 36.0–46.0)
Hemoglobin: 10.9 g/dL — ABNORMAL LOW (ref 12.0–15.0)
MCH: 29.5 pg (ref 26.0–34.0)
MCHC: 32.2 g/dL (ref 30.0–36.0)
MCV: 91.9 fL (ref 80.0–100.0)
Platelets: 239 K/uL (ref 150–400)
RBC: 3.69 MIL/uL — ABNORMAL LOW (ref 3.87–5.11)
RDW: 14.6 % (ref 11.5–15.5)
WBC: 9.3 K/uL (ref 4.0–10.5)
nRBC: 0 % (ref 0.0–0.2)

## 2024-10-07 LAB — POCT ACTIVATED CLOTTING TIME: Activated Clotting Time: 286 s

## 2024-10-07 LAB — APTT: aPTT: 50 s — ABNORMAL HIGH (ref 24–36)

## 2024-10-07 SURGERY — ENDARTERECTOMY, CAROTID
Anesthesia: General | Site: Neck | Laterality: Right

## 2024-10-07 MED ORDER — ONDANSETRON HCL 4 MG/2ML IJ SOLN
INTRAMUSCULAR | Status: AC
  Filled 2024-10-07: qty 2

## 2024-10-07 MED ORDER — ORAL CARE MOUTH RINSE: 15.0000 mL | Freq: Once | OROMUCOSAL | Status: AC

## 2024-10-07 MED ORDER — MIDAZOLAM HCL (PF) 2 MG/2ML IJ SOLN
INTRAMUSCULAR | Status: DC | PRN
  Administered 2024-10-07: 1 mg via INTRAVENOUS

## 2024-10-07 MED ORDER — CHLORHEXIDINE GLUCONATE 0.12 % MT SOLN
15.0000 mL | Freq: Once | OROMUCOSAL | Status: AC
  Administered 2024-10-07: 15 mL via OROMUCOSAL

## 2024-10-07 MED ORDER — ACETAMINOPHEN 650 MG RE SUPP: 325.0000 mg | RECTAL | Status: DC | PRN

## 2024-10-07 MED ORDER — OXYCODONE HCL 5 MG/5ML PO SOLN: 5.0000 mg | Freq: Once | ORAL | Status: DC | PRN

## 2024-10-07 MED ORDER — SENNOSIDES-DOCUSATE SODIUM 8.6-50 MG PO TABS: 1.0000 | ORAL_TABLET | Freq: Every evening | ORAL | Status: DC | PRN

## 2024-10-07 MED ORDER — LACTATED RINGERS IV SOLN: INTRAVENOUS | Status: DC | PRN

## 2024-10-07 MED ORDER — ALLOPURINOL 100 MG PO TABS
100.0000 mg | ORAL_TABLET | Freq: Every day | ORAL | Status: DC
  Administered 2024-10-08: 100 mg via ORAL
  Filled 2024-10-07: qty 1

## 2024-10-07 MED ORDER — IPRATROPIUM BROMIDE 0.03 % NA SOLN: 2.0000 | Freq: Two times a day (BID) | NASAL | Status: DC

## 2024-10-07 MED ORDER — PROTAMINE SULFATE 10 MG/ML IV SOLN
INTRAVENOUS | Status: DC | PRN
  Administered 2024-10-07: 50 mg via INTRAVENOUS

## 2024-10-07 MED ORDER — LACTATED RINGERS IV SOLN: INTRAVENOUS | Status: DC

## 2024-10-07 MED ORDER — ROSUVASTATIN CALCIUM 20 MG PO TABS
40.0000 mg | ORAL_TABLET | Freq: Every day | ORAL | Status: DC
  Administered 2024-10-07: 40 mg via ORAL
  Filled 2024-10-07 (×2): qty 2

## 2024-10-07 MED ORDER — INSULIN ASPART 100 UNIT/ML IJ SOLN: 0.0000 [IU] | INTRAMUSCULAR | Status: DC | PRN

## 2024-10-07 MED ORDER — ALBUMIN HUMAN 5 % IV SOLN
12.5000 g | Freq: Once | INTRAVENOUS | Status: AC
  Administered 2024-10-07: 12.5 g via INTRAVENOUS

## 2024-10-07 MED ORDER — HEPARIN 6000 UNIT IRRIGATION SOLUTION
Status: DC | PRN
  Administered 2024-10-07: 1

## 2024-10-07 MED ORDER — ONDANSETRON HCL 4 MG/2ML IJ SOLN
INTRAMUSCULAR | Status: DC | PRN
  Administered 2024-10-07: 4 mg via INTRAVENOUS

## 2024-10-07 MED ORDER — FENTANYL CITRATE (PF) 100 MCG/2ML IJ SOLN
25.0000 ug | INTRAMUSCULAR | Status: DC | PRN
  Administered 2024-10-07: 25 ug via INTRAVENOUS

## 2024-10-07 MED ORDER — INSULIN ASPART 100 UNIT/ML IJ SOLN: 0.0000 [IU] | Freq: Every day | INTRAMUSCULAR | Status: DC

## 2024-10-07 MED ORDER — POTASSIUM CHLORIDE CRYS ER 20 MEQ PO TBCR: 40.0000 meq | EXTENDED_RELEASE_TABLET | Freq: Every day | ORAL | Status: DC | PRN

## 2024-10-07 MED ORDER — ACETAMINOPHEN 325 MG PO TABS
325.0000 mg | ORAL_TABLET | ORAL | Status: DC | PRN
  Administered 2024-10-07 – 2024-10-08 (×2): 650 mg via ORAL
  Filled 2024-10-07 (×2): qty 2

## 2024-10-07 MED ORDER — SODIUM CHLORIDE 0.9 % IV SOLN: INTRAVENOUS | Status: DC

## 2024-10-07 MED ORDER — FLEET ENEMA RE ENEM: 1.0000 | ENEMA | Freq: Once | RECTAL | Status: DC | PRN

## 2024-10-07 MED ORDER — OXYCODONE HCL 5 MG PO TABS
5.0000 mg | ORAL_TABLET | ORAL | Status: DC | PRN
  Administered 2024-10-07 – 2024-10-08 (×3): 5 mg via ORAL
  Filled 2024-10-07 (×3): qty 1

## 2024-10-07 MED ORDER — SODIUM CHLORIDE 0.9% FLUSH
3.0000 mL | Freq: Two times a day (BID) | INTRAVENOUS | Status: DC
  Administered 2024-10-07 – 2024-10-08 (×3): 3 mL via INTRAVENOUS

## 2024-10-07 MED ORDER — CEFAZOLIN SODIUM-DEXTROSE 2-4 GM/100ML-% IV SOLN
2.0000 g | Freq: Three times a day (TID) | INTRAVENOUS | Status: AC
  Administered 2024-10-07 (×2): 2 g via INTRAVENOUS
  Filled 2024-10-07 (×2): qty 100

## 2024-10-07 MED ORDER — ALBUMIN HUMAN 5 % IV SOLN
INTRAVENOUS | Status: AC
  Filled 2024-10-07: qty 250

## 2024-10-07 MED ORDER — PROPOFOL 10 MG/ML IV BOLUS
INTRAVENOUS | Status: DC | PRN
  Administered 2024-10-07: 100 mg via INTRAVENOUS

## 2024-10-07 MED ORDER — SODIUM CHLORIDE 0.9 % IV SOLN
25.0000 ug/min | INTRAVENOUS | Status: DC
  Administered 2024-10-07: 25 ug/min via INTRAVENOUS

## 2024-10-07 MED ORDER — LABETALOL HCL 5 MG/ML IV SOLN: 10.0000 mg | INTRAVENOUS | Status: DC | PRN

## 2024-10-07 MED ORDER — ONDANSETRON HCL 4 MG/2ML IJ SOLN: 4.0000 mg | Freq: Once | INTRAMUSCULAR | Status: DC | PRN

## 2024-10-07 MED ORDER — FENTANYL CITRATE (PF) 100 MCG/2ML IJ SOLN
INTRAMUSCULAR | Status: AC
  Filled 2024-10-07: qty 2

## 2024-10-07 MED ORDER — PROPOFOL 10 MG/ML IV BOLUS
INTRAVENOUS | Status: AC
  Filled 2024-10-07: qty 20

## 2024-10-07 MED ORDER — DEXAMETHASONE SOD PHOSPHATE PF 10 MG/ML IJ SOLN
INTRAMUSCULAR | Status: DC | PRN
  Administered 2024-10-07: 5 mg via INTRAVENOUS

## 2024-10-07 MED ORDER — SODIUM CHLORIDE 0.9 % IV SOLN: 250.0000 mL | INTRAVENOUS | Status: DC | PRN

## 2024-10-07 MED ORDER — INSULIN ASPART 100 UNIT/ML IJ SOLN
0.0000 [IU] | Freq: Three times a day (TID) | INTRAMUSCULAR | Status: DC
  Administered 2024-10-07: 5 [IU] via SUBCUTANEOUS
  Administered 2024-10-08: 2 [IU] via SUBCUTANEOUS
  Filled 2024-10-07: qty 5
  Filled 2024-10-07: qty 2

## 2024-10-07 MED ORDER — SUGAMMADEX SODIUM 200 MG/2ML IV SOLN
INTRAVENOUS | Status: DC | PRN
  Administered 2024-10-07: 250 mg via INTRAVENOUS

## 2024-10-07 MED ORDER — HEPARIN SODIUM (PORCINE) 1000 UNIT/ML IJ SOLN
INTRAMUSCULAR | Status: DC | PRN
  Administered 2024-10-07: 10000 [IU] via INTRAVENOUS

## 2024-10-07 MED ORDER — ROCURONIUM BROMIDE 100 MG/10ML IV SOLN
INTRAVENOUS | Status: DC | PRN
  Administered 2024-10-07: 10 mg via INTRAVENOUS
  Administered 2024-10-07: 80 mg via INTRAVENOUS

## 2024-10-07 MED ORDER — CHLORHEXIDINE GLUCONATE CLOTH 2 % EX PADS: 6.0000 | MEDICATED_PAD | Freq: Once | CUTANEOUS | Status: DC

## 2024-10-07 MED ORDER — ACETAMINOPHEN 10 MG/ML IV SOLN: 1000.0000 mg | Freq: Once | INTRAVENOUS | Status: DC | PRN

## 2024-10-07 MED ORDER — PHENYLEPHRINE 80 MCG/ML (10ML) SYRINGE FOR IV PUSH (FOR BLOOD PRESSURE SUPPORT)
PREFILLED_SYRINGE | INTRAVENOUS | Status: AC
  Filled 2024-10-07: qty 10

## 2024-10-07 MED ORDER — SUGAMMADEX SODIUM 200 MG/2ML IV SOLN
INTRAVENOUS | Status: AC
  Filled 2024-10-07: qty 2

## 2024-10-07 MED ORDER — OXYCODONE HCL 5 MG PO TABS: 5.0000 mg | ORAL_TABLET | Freq: Once | ORAL | Status: DC | PRN

## 2024-10-07 MED ORDER — HEPARIN 6000 UNIT IRRIGATION SOLUTION
Status: AC
  Filled 2024-10-07: qty 500

## 2024-10-07 MED ORDER — CEFAZOLIN SODIUM-DEXTROSE 2-4 GM/100ML-% IV SOLN
2.0000 g | INTRAVENOUS | Status: AC
  Administered 2024-10-07: 2 g via INTRAVENOUS

## 2024-10-07 MED ORDER — MIDAZOLAM HCL 2 MG/2ML IJ SOLN
INTRAMUSCULAR | Status: AC
  Filled 2024-10-07: qty 2

## 2024-10-07 MED ORDER — SODIUM CHLORIDE 0.9 % IV SOLN: 500.0000 mL | Freq: Once | INTRAVENOUS | Status: DC | PRN

## 2024-10-07 MED ORDER — EZETIMIBE 10 MG PO TABS: 10.0000 mg | ORAL_TABLET | Freq: Every evening | ORAL | Status: DC

## 2024-10-07 MED ORDER — LINACLOTIDE 145 MCG PO CAPS: 145.0000 ug | ORAL_CAPSULE | Freq: Every day | ORAL | Status: DC | PRN

## 2024-10-07 MED ORDER — 0.9 % SODIUM CHLORIDE (POUR BTL) OPTIME
TOPICAL | Status: DC | PRN
  Administered 2024-10-07: 1000 mL

## 2024-10-07 MED ORDER — LIDOCAINE HCL (CARDIAC) PF 100 MG/5ML IV SOSY
PREFILLED_SYRINGE | INTRAVENOUS | Status: DC | PRN
  Administered 2024-10-07: 100 mg via INTRATRACHEAL

## 2024-10-07 MED ORDER — HYDRALAZINE HCL 20 MG/ML IJ SOLN: 5.0000 mg | INTRAMUSCULAR | Status: DC | PRN

## 2024-10-07 MED ORDER — FENTANYL CITRATE (PF) 250 MCG/5ML IJ SOLN
INTRAMUSCULAR | Status: DC | PRN
  Administered 2024-10-07 (×2): 25 ug via INTRAVENOUS
  Administered 2024-10-07: 100 ug via INTRAVENOUS
  Administered 2024-10-07: 25 ug via INTRAVENOUS

## 2024-10-07 MED ORDER — ONDANSETRON HCL 4 MG/2ML IJ SOLN: 4.0000 mg | Freq: Four times a day (QID) | INTRAMUSCULAR | Status: DC | PRN

## 2024-10-07 MED ORDER — SODIUM CHLORIDE 0.9 % IV SOLN: 250.0000 mL | INTRAVENOUS | Status: DC

## 2024-10-07 MED ORDER — BISACODYL 5 MG PO TBEC: 5.0000 mg | DELAYED_RELEASE_TABLET | Freq: Every day | ORAL | Status: DC | PRN

## 2024-10-07 MED ORDER — LIDOCAINE HCL 1 % IJ SOLN
INTRAMUSCULAR | Status: AC
  Filled 2024-10-07: qty 20

## 2024-10-07 MED ORDER — PHENYLEPHRINE HCL-NACL 20-0.9 MG/250ML-% IV SOLN
INTRAVENOUS | Status: DC | PRN
  Administered 2024-10-07: 50 ug/min via INTRAVENOUS

## 2024-10-07 MED ORDER — DOCUSATE SODIUM 100 MG PO CAPS
100.0000 mg | ORAL_CAPSULE | Freq: Every day | ORAL | Status: DC
  Filled 2024-10-07: qty 1

## 2024-10-07 MED ORDER — CLOPIDOGREL BISULFATE 75 MG PO TABS
75.0000 mg | ORAL_TABLET | Freq: Every day | ORAL | Status: DC
  Administered 2024-10-08: 75 mg via ORAL
  Filled 2024-10-07: qty 1

## 2024-10-07 MED ORDER — HYDROMORPHONE HCL 1 MG/ML IJ SOLN: 0.5000 mg | INTRAMUSCULAR | Status: DC | PRN

## 2024-10-07 MED ORDER — METOPROLOL TARTRATE 5 MG/5ML IV SOLN: 2.5000 mg | INTRAVENOUS | Status: DC | PRN

## 2024-10-07 MED ORDER — AZELASTINE HCL 0.1 % NA SOLN
2.0000 | Freq: Two times a day (BID) | NASAL | Status: DC
  Administered 2024-10-07 – 2024-10-08 (×2): 2 via NASAL
  Filled 2024-10-07: qty 30

## 2024-10-07 MED ORDER — HEMOSTATIC AGENTS (NO CHARGE) OPTIME
TOPICAL | Status: DC | PRN
  Administered 2024-10-07: 1 via TOPICAL

## 2024-10-07 MED ORDER — HEPARIN SODIUM (PORCINE) 5000 UNIT/ML IJ SOLN
5000.0000 [IU] | Freq: Three times a day (TID) | INTRAMUSCULAR | Status: DC
  Administered 2024-10-08: 5000 [IU] via SUBCUTANEOUS
  Filled 2024-10-07: qty 1

## 2024-10-07 MED ORDER — SODIUM CHLORIDE 0.9% FLUSH: 3.0000 mL | INTRAVENOUS | Status: DC | PRN

## 2024-10-07 MED ORDER — PAROXETINE HCL 20 MG PO TABS
20.0000 mg | ORAL_TABLET | Freq: Every day | ORAL | Status: DC
  Administered 2024-10-08: 20 mg via ORAL
  Filled 2024-10-07: qty 1

## 2024-10-07 MED ORDER — ALBUMIN HUMAN 5 % IV SOLN: 12.5000 g | Freq: Once | INTRAVENOUS | Status: DC

## 2024-10-07 MED ORDER — ASPIRIN 81 MG PO TBEC
81.0000 mg | DELAYED_RELEASE_TABLET | Freq: Every day | ORAL | Status: DC
  Administered 2024-10-08: 81 mg via ORAL
  Filled 2024-10-07: qty 1

## 2024-10-07 MED ORDER — FENTANYL CITRATE (PF) 250 MCG/5ML IJ SOLN
INTRAMUSCULAR | Status: AC
  Filled 2024-10-07: qty 5

## 2024-10-07 MED ORDER — PHENOL 1.4 % MT LIQD: 1.0000 | OROMUCOSAL | Status: DC | PRN

## 2024-10-07 SURGICAL SUPPLY — 41 items
BAG COUNTER SPONGE SURGICOUNT (BAG) ×1 IMPLANT
BAG DECANTER FOR FLEXI CONT (MISCELLANEOUS) ×1 IMPLANT
CANISTER SUCTION 3000ML PPV (SUCTIONS) ×1 IMPLANT
CATH ROBINSON RED A/P 18FR (CATHETERS) ×1 IMPLANT
CLIP LIGATING EXTRA MED SLVR (CLIP) ×1 IMPLANT
CLIP LIGATING EXTRA SM BLUE (MISCELLANEOUS) ×1 IMPLANT
COVER PROBE W GEL 5X96 (DRAPES) ×1 IMPLANT
DERMABOND ADVANCED .7 DNX12 (GAUZE/BANDAGES/DRESSINGS) ×1 IMPLANT
DRSG NASOPORE 8CM (GAUZE/BANDAGES/DRESSINGS) IMPLANT
ELECTRODE REM PT RTRN 9FT ADLT (ELECTROSURGICAL) ×1 IMPLANT
GLOVE BIO SURGEON STRL SZ7.5 (GLOVE) ×1 IMPLANT
GOWN STRL REUS W/ TWL LRG LVL3 (GOWN DISPOSABLE) ×2 IMPLANT
GOWN STRL REUS W/ TWL XL LVL3 (GOWN DISPOSABLE) ×1 IMPLANT
HEMOSTAT SNOW SURGICEL 2X4 (HEMOSTASIS) IMPLANT
INSERT FOGARTY SM (MISCELLANEOUS) IMPLANT
IV ADAPTER SYR DOUBLE MALE LL (MISCELLANEOUS) IMPLANT
KIT BASIN OR (CUSTOM PROCEDURE TRAY) ×1 IMPLANT
KIT SHUNT ARGYLE CAROTID ART 6 (VASCULAR PRODUCTS) ×1 IMPLANT
KIT TURNOVER KIT B (KITS) ×1 IMPLANT
LOOP VESSEL MINI RED (MISCELLANEOUS) IMPLANT
NDL HYPO 25GX1X1/2 BEV (NEEDLE) IMPLANT
NDL SPNL 20GX3.5 QUINCKE YW (NEEDLE) IMPLANT
NEEDLE HYPO 25GX1X1/2 BEV (NEEDLE) IMPLANT
NEEDLE SPNL 20GX3.5 QUINCKE YW (NEEDLE) IMPLANT
PACK CAROTID (CUSTOM PROCEDURE TRAY) ×1 IMPLANT
PAD ARMBOARD POSITIONER FOAM (MISCELLANEOUS) ×2 IMPLANT
PATCH VASC XENOSURE 1X6 (Vascular Products) IMPLANT
POSITIONER HEAD DONUT 9IN (MISCELLANEOUS) ×1 IMPLANT
POWDER SURGICEL 3.0 GRAM (HEMOSTASIS) IMPLANT
SOLN 0.9% NACL POUR BTL 1000ML (IV SOLUTION) ×3 IMPLANT
SOLN STERILE WATER BTL 1000 ML (IV SOLUTION) ×1 IMPLANT
SUT ETHILON 3 0 PS 1 (SUTURE) IMPLANT
SUT MNCRL AB 4-0 PS2 18 (SUTURE) ×1 IMPLANT
SUT PROLENE 5 0 C 1 24 (SUTURE) IMPLANT
SUT PROLENE 6 0 BV (SUTURE) ×1 IMPLANT
SUT SILK 3-0 18XBRD TIE 12 (SUTURE) IMPLANT
SUT VIC AB 3-0 SH 27X BRD (SUTURE) ×1 IMPLANT
SYR CONTROL 10ML LL (SYRINGE) IMPLANT
TAPE UMBILICAL 1/8X30 (MISCELLANEOUS) IMPLANT
TOWEL GREEN STERILE (TOWEL DISPOSABLE) ×1 IMPLANT
TUBING ART PRESS 48 MALE/FEM (TUBING) IMPLANT

## 2024-10-07 NOTE — Transfer of Care (Signed)
 Immediate Anesthesia Transfer of Care Note  Patient: Brandi Peters  Procedure(s) Performed: RIGHT CAROTID ENDARTERECTOMY (Right: Neck) ANGIOPLASTY OF RIGHT CAROTID USING BOVINE PERICARDIUM PATCH GRAFT (Right: Neck)  Patient Location: PACU  Anesthesia Type:General  Level of Consciousness: awake, alert , and oriented  Airway & Oxygen Therapy: Patient Spontanous Breathing and Patient connected to face mask oxygen  Post-op Assessment: Report given to RN and Post -op Vital signs reviewed and stable  Post vital signs: Reviewed and stable  Last Vitals:  Vitals Value Taken Time  BP 96/40 10/07/24 10:00  Temp    Pulse 78 10/07/24 10:04  Resp 14 10/07/24 10:04  SpO2 91 % 10/07/24 10:04  Vitals shown include unfiled device data.  Last Pain:  Vitals:   10/07/24 0600  TempSrc: (P) Oral         Complications: No notable events documented.

## 2024-10-07 NOTE — Discharge Instructions (Signed)

## 2024-10-07 NOTE — Anesthesia Procedure Notes (Signed)
 Arterial Line Insertion Start/End12/19/2025 7:10 AM, 10/07/2024 7:21 AM Performed by: CRNA  Patient location: Pre-op. Preanesthetic checklist: patient identified, IV checked, site marked, risks and benefits discussed, surgical consent, monitors and equipment checked, pre-op evaluation, timeout performed and anesthesia consent Lidocaine  1% used for infiltration radial was placed Catheter size: 20 G Hand hygiene performed  and maximum sterile barriers used   Attempts: 1 Procedure performed using ultrasound to evaluate access site. Ultrasound Notes:relevant anatomy identified, ultrasound used to visualize needle entry and vessel patent under ultrasound. Following insertion, dressing applied. Post procedure assessment: normal and unchanged

## 2024-10-07 NOTE — Progress Notes (Signed)
" °  °  Patient evaluated at bedside post carotid endarterectomy.  She is sitting up in the bed eating pot roast and her voice is strong and neck is soft.  Blood pressure is stable and consistent with preoperative pressure this morning.  Alphonzo Devera C. Sheree, MD [facility] Office: [phone] Pager: [phone]  "

## 2024-10-07 NOTE — Progress Notes (Signed)
 Patient arrived at the unit from  [facility] arrival pt is alert and oriented X4,CHG bath given,vitals taken,BP on soft side MD aware about it,NIH scale 0,pt oriented to the unit,call bell in reach

## 2024-10-07 NOTE — H&P (Signed)
 "   HPI:   Brandi Peters is a 73 y.o. female history of high-grade right ICA stenosis which remains asymptomatic without history of stroke, TIA or amaurosis.  She has hypertension hyperlipidemia and type 2 diabetes as risk factors.  She was planned for right carotid endarterectomy but canceled at the time of surgery due to uncontrolled blood glucose.  She now follows up for surgical discussion.  She has many questions and is admittedly nervous about any surgical intervention.  She is taking aspirin, Plavix  and a statin.       Past Medical History:  Diagnosis Date   Allergy     Anxiety     Arthritis     Carotid artery occlusion     Diabetes mellitus without complication (HCC)     Fibromyalgia     GERD (gastroesophageal reflux disease)     Gout     Heart murmur     HTN (hypertension) 03/24/2019   Hyperlipemia 03/24/2019   Type 2 diabetes mellitus with complication, without long-term current use of insulin (HCC) 03/24/2019             Family History  Problem Relation Age of Onset   Heart disease Mother     Hypertension Mother     Hypertension Father     Hyperlipidemia Father     Heart disease Brother     Hypertension Brother     Hypertension Brother               Past Surgical History:  Procedure Laterality Date   ABDOMINAL HYSTERECTOMY       BREAST BIOPSY Left 06/23/2012   BREAST EXCISIONAL BIOPSY Left a long time ago per pt    no visible scar   CHOLECYSTECTOMY       JOINT REPLACEMENT Left      shoulder          Short Social History:  Social History         Tobacco Use   Smoking status: Former      Current packs/day: 0.00      Average packs/day: 1 pack/day for 25.0 years (25.0 ttl pk-yrs)      Types: Cigarettes      Start date: 31      Quit date: 2015      Years since quitting: 10.7   Smokeless tobacco: Never  Substance Use Topics   Alcohol use: Yes      Comment: on holidays       Allergies       Allergies  Allergen Reactions   Pioglitazone  Shortness Of Breath and Swelling   Metformin Hcl        diarrhea with high doses              Current Outpatient Medications  Medication Sig Dispense Refill   acetaminophen (TYLENOL) 500 MG tablet Take 1,000-1,500 mg by mouth every 8 (eight) hours as needed for moderate pain.       allopurinol (ZYLOPRIM) 100 MG tablet Take 100 mg by mouth daily.       aspirin 81 MG chewable tablet Chew 81 mg by mouth daily.       augmented betamethasone dipropionate (DIPROLENE-AF) 0.05 % cream Apply 1 Application topically 2 (two) times daily as needed.       azelastine  (ASTELIN ) 0.1 % nasal spray Place 2 sprays into both nostrils 2 (two) times daily. Use in each nostril as directed 30 mL 12   Carboxymethylcellulose Sodium (REFRESH  TEARS OP) Place 1 drop into both eyes daily as needed (dry eyes).       clopidogrel  (PLAVIX ) 75 MG tablet TAKE 1 TABLET (75 MG TOTAL) BY MOUTH DAILY. 90 tablet 3   empagliflozin  (JARDIANCE ) 25 MG TABS tablet Take 1 tablet (25 mg total) by mouth daily before breakfast. 90 tablet 3   ezetimibe  (ZETIA ) 10 MG tablet TAKE 1 TABLET (10 MG TOTAL) BY MOUTH EVERY EVENING. 90 tablet 3   famotidine  (PEPCID ) 20 MG tablet Take 1 tablet (20 mg total) by mouth 2 (two) times daily. 30 tablet 1   furosemide (LASIX) 40 MG tablet Take 40 mg by mouth as needed for edema.       ipratropium (ATROVENT ) 0.03 % nasal spray Place 2 sprays into both nostrils every 12 (twelve) hours. 30 mL 12   labetalol (NORMODYNE) 300 MG tablet Take 300 mg by mouth 2 (two) times daily.       levocetirizine (XYZAL ) 5 MG tablet TAKE 1 TABLET EVERY EVENING 90 tablet 3   linaclotide (LINZESS) 145 MCG CAPS capsule Take 145 mcg by mouth daily as needed (constipation).       losartan-hydrochlorothiazide (HYZAAR) 100-25 MG tablet Take 1 tablet by mouth daily.       PARoxetine (PAXIL) 20 MG tablet Take 20 mg by mouth daily.       rosuvastatin  (CRESTOR ) 40 MG tablet Take 1 tablet (40 mg total) by mouth daily. 90 tablet 3    Semaglutide, 2 MG/DOSE, (OZEMPIC, 2 MG/DOSE,) 8 MG/3ML SOPN Inject 2 mg into the skin once a week.       TRESIBA FLEXTOUCH 200 UNIT/ML FlexTouch Pen Inject 25 Units into the skin daily.       Vitamin D, Ergocalciferol, (DRISDOL) 1.25 MG (50000 UNIT) CAPS capsule Take 50,000 Units by mouth every Wednesday.          No current facility-administered medications for this visit.        Review of Systems  Constitutional:  Constitutional negative. HENT: HENT negative.  Eyes: Eyes negative.  Respiratory: Respiratory negative.  Cardiovascular: Cardiovascular negative.  GI: Gastrointestinal negative.  Musculoskeletal: Musculoskeletal negative.  Skin: Skin negative.  Neurological: Neurological negative. Hematologic: Hematologic/lymphatic negative.  Psychiatric: Psychiatric negative.          Objective:    Vitals:   10/07/24 0600  BP: (!) (P) 127/49  Pulse: (P) 76  Resp: (P) 16  Temp: (P) 98.1 F (36.7 C)  SpO2: (P) 98%      Physical Exam HENT:     Head: Normocephalic.     Nose: Nose normal.  Eyes:     Pupils: Pupils are equal, round, and reactive to light.  Neck:     Vascular: Carotid bruit present.  Cardiovascular:     Rate and Rhythm: Normal rate.     Pulses: Normal pulses.  Pulmonary:     Effort: Pulmonary effort is normal.  Abdominal:     General: Abdomen is flat.  Musculoskeletal:     Right lower leg: Edema present.     Left lower leg: Edema present.  Skin:    Capillary Refill: Capillary refill takes less than 2 seconds.  Neurological:     General: No focal deficit present.     Mental Status: She is alert.  Psychiatric:        Mood and Affect: Mood normal.       Data: Right Carotid Findings:  +----------+--------+--------+--------+------------------+-----------------  -+  PSV cm/sEDV cm/sStenosisPlaque DescriptionComments             +----------+--------+--------+--------+------------------+-----------------  -+  CCA Prox  47       8                                 tortuous             +----------+--------+--------+--------+------------------+-----------------  -+  CCA Distal40      11                                intimal  thickening  +----------+--------+--------+--------+------------------+-----------------  -+  ICA Prox  37      12              heterogenous      Shadowing            +----------+--------+--------+--------+------------------+-----------------  -+  ICA Mid   42      12                                                     +----------+--------+--------+--------+------------------+-----------------  -+  ICA Distal17      4                                                      +----------+--------+--------+--------+------------------+-----------------  -+  ECA                                                not seen             +----------+--------+--------+--------+------------------+-----------------  -+   +----------+--------+-------+----------------+-------------------+           PSV cm/sEDV cmsDescribe        Arm Pressure (mmHG)  +----------+--------+-------+----------------+-------------------+  [id number]           Multiphasic, [id number]                  +----------+--------+-------+----------------+-------------------+   +---------+--------+--+--------+--+---------+  VertebralPSV cm/s49EDV cm/s15Antegrade  +---------+--------+--+--------+--+---------+   Cannot replicate previous high velocities in the RICA. Poor visualization  past the distal CCA.   Left Carotid Findings:  +----------+--------+--------+--------+------------------+-----------------  -+           PSV cm/sEDV cm/sStenosisPlaque DescriptionComments             +----------+--------+--------+--------+------------------+-----------------  -+  CCA Prox  101     6                                                       +----------+--------+--------+--------+------------------+-----------------  -+  CCA Distal68      12  intimal  thickening  +----------+--------+--------+--------+------------------+-----------------  -+  ICA Prox  140     31      1-39%   heterogenous      Shadowing            +----------+--------+--------+--------+------------------+-----------------  -+  ICA Mid   92      24                                                     +----------+--------+--------+--------+------------------+-----------------  -+  ICA Distal86      25                                                     +----------+--------+--------+--------+------------------+-----------------  -+  ECA      158     0               heterogenous                           +----------+--------+--------+--------+------------------+-----------------  -+   +----------+--------+--------+----------------+-------------------+           PSV cm/sEDV cm/sDescribe        Arm Pressure (mmHG)  +----------+--------+--------+----------------+-------------------+  [id number]            Multiphasic, [id number]                  +----------+--------+--------+----------------+-------------------+   +---------+--------+--+--------+--+---------+  VertebralPSV cm/s59EDV cm/s14Antegrade  +---------+--------+--+--------+--+---------+         Summary:  Right Carotid: Bifurcation not well seen, results likely inaccurate, as  unable                to replicate previously elevated velocities - suggest  alternate                testing modality. Known severe RICA stenosis by both CT and                 Duplex.   Left Carotid: Velocities in the left ICA are consistent with a 1-39%  stenosis.   Vertebrals: Bilateral vertebral arteries demonstrate antegrade flow.  Subclavians: Normal flow hemodynamics were seen in bilateral subclavian               arteries.     CTA IMPRESSION: 1. Severe stenosis at the right common carotid bifurcation due to bulky calcified plaque. The lumen is unmeasurable given the degree of stenosis and calcified plaque. 2. Atherosclerosis elsewhere in the head and neck that does not cause flow reducing stenosis. 3. Chronic right parietal infarct which is small.        Assessment/Plan:   73 year old female with high-grade right carotid bifurcation stenosis secondary to bulky calcified plaque.  She is again here to discuss surgical intervention and has previously been canceled secondary to hyperglycemia.  She is followed closely by Dr. Ladona.  We again reviewed her CT scan and discussed her options being ongoing medical therapy versus carotid endarterectomy.  We discussed that she is not a suitable candidate for stenting due to the plaque morphology being heavily calcified.  All questions were answered and she agrees to proceed with right carotid endarterectomy and consent was  signed.    Raziel Koenigs C. Sheree, MD [facility] Office: [phone] Pager: [phone]  "

## 2024-10-07 NOTE — Anesthesia Procedure Notes (Signed)
 Procedure Name: Intubation Date/Time: 10/07/2024 7:45 AM  Performed by: Vera Rochele PARAS, CRNAPre-anesthesia Checklist: Patient identified, Emergency Drugs available, Suction available and Patient being monitored Patient Re-evaluated:Patient Re-evaluated prior to induction Oxygen Delivery Method: Circle System Utilized Preoxygenation: Pre-oxygenation with 100% oxygen Induction Type: IV induction Ventilation: Mask ventilation without difficulty and Oral airway inserted - appropriate to patient size Laryngoscope Size: Miller and 3 Grade View: Grade I Tube type: Oral Tube size: 7.0 mm Number of attempts: 1 Airway Equipment and Method: Stylet and Oral airway Placement Confirmation: ETT inserted through vocal cords under direct vision, positive ETCO2 and breath sounds checked- equal and bilateral Secured at: 21 cm Tube secured with: Tape Dental Injury: Teeth and Oropharynx as per pre-operative assessment

## 2024-10-07 NOTE — Op Note (Signed)
 "   Patient name: Brandi Peters MRN: [id number] DOB: [birth year] Sex: female  10/07/2024 Pre-operative Diagnosis: Asymptomatic high-grade right ICA stenosis Post-operative diagnosis:  Same Surgeon:  Penne C. Sheree, MD Assistant: Curry Damme, PA Procedure Performed:Right carotid endarterectomy with bovine pericardial patch angioplasty and 10 French shunt neuroprotection  Indications: 73 year old female with known high-grade calcified stenosis of the right ICA.  We have discussed her options including medical therapy versus carotid stenting versus carotid endarterectomy.  We have reviewed her CT scan and duplex results together and discussed her plaque morphology to be highly calcified and for those reason she is not a suitable candidate for carotid stenting.  She has been evaluated by cardiology and cleared with low risk for cardiac complication from carotid endarterectomy.  We have discussed the risk and benefits as well as alternatives of the procedure and she demonstrates good understanding and agrees to proceed.   Given the complexity of the case,  the assistant was necessary in order to expedite the procedure and safely perform the technical aspects of the operation.  The assistant provided traction and countertraction to assist with exposure of the common carotid artery, external carotid artery, and internal carotid artery.  They also assisted with suture ligatures and dividing the facial vein and multiple small venous branches tethering the hypoglossal nerve. The assistant also played a critical role in placing the shunt safely.  In addition they were necessary to provide adequate traction and countertraction to perform a precise endarterectomy and precise closure.  These skills, especially following the Prolene suture for the anastomosis, could not have been adequately performed by a scrub tech assistant.    Findings: Highly calcified plaque at the carotid bifurcation that was subtotally  occlusive.  Minimal backbleeding from the ICA and stent was placed.  Expected Doppler signals at completion with low resistance signal in the ICA distal to the patch repair.  Patient was neuro intact upon awakening anesthesia.   Procedure:  The patient was identified in the holding area and taken to the operating room where she is placed supine on the operative table in a Semi-Fowler position with the neck rotated to the left and a bump under her shoulders.  She was then sterilely prepped and draped in the right neck and chest in the usual fashion, antibiotics were administered and a timeout was called.  Ultrasound was used to identify the common carotid artery as well as carotid bifurcation.  An incision was then created along the anterior border of the sternocleidomastoid we dissected down through skin subcutaneous tissue and platysma and retracted the sternocleidomastoid laterally.  Given her short neck the omohyoid was divided using cautery.  We identified the common carotid artery retracted the IJ laterally encircled the common carotid artery with Rummel tourniquet and the patient was fully heparinized and ACT returned greater than 250.  We dissected hide at the bifurcation divided the facial vein taking care to avoid the underlying nerve structures.  Superior thyroidal artery and external carotid artery beyond this were encircled with Vesseloops.  We identified the hypoglossal and this was protected and the ansa cervicalis was divided for better exposure.  The ICA a few centimeters distal to the bifurcation was encircled with vessel loop.  A 10 French shunt was then prepared as well as a bovine pericardial patch.  After ACT was confirmed as well as a mean arterial pressure of 100 we then clamped the ICA followed by the common carotid artery followed by the ECA.  The vessel was  then opened from the common carotid artery onto the ICA and a shunt was placed distally allowed to backbleed and placed proximally  and confirmed flow with Doppler.  We proceeded with endarterectomy which was actually quite short approximately 1.5 cm and we had smooth tapering distally.  We then sewed and placed a bovine pericardial patch with 6-0 Prolene suture and prior to completion we first removed the shunt and then thoroughly irrigated and allowed backbleeding and antegrade bleeding in all directions.  Prior to completing the anastomosis we thoroughly irrigated the carotid bulb with heparinized saline and allow the ECA to then backbleed when we completed the anastomosis.  We then released the clamp off of the proximal common carotid artery and after several cardiac cycles the ICA distally.  Doppler demonstrated expected flow in the distal ICA.  50 mg of protamine was administered and we obtained meticulous hemostasis.  We then thoroughly irrigated the neck wound and closed in layers with Vicryl and Monocryl.  She was then awakened from anesthesia having tolerated the procedure without any complication.  All counts were correct at completion.  EBL: 100 cc     Sebastiana Wuest C. Sheree, MD [facility] Office: [phone] Pager: [phone]   "

## 2024-10-08 ENCOUNTER — Other Ambulatory Visit (HOSPITAL_COMMUNITY): Payer: Self-pay

## 2024-10-08 DIAGNOSIS — Z48812 Encounter for surgical aftercare following surgery on the circulatory system: Secondary | ICD-10-CM

## 2024-10-08 LAB — BASIC METABOLIC PANEL WITH GFR
Anion gap: 10 (ref 5–15)
BUN: 36 mg/dL — ABNORMAL HIGH (ref 8–23)
CO2: 24 mmol/L (ref 22–32)
Calcium: 8.7 mg/dL — ABNORMAL LOW (ref 8.9–10.3)
Chloride: 107 mmol/L (ref 98–111)
Creatinine, Ser: 1.41 mg/dL — ABNORMAL HIGH (ref 0.44–1.00)
GFR, Estimated: 39 mL/min — ABNORMAL LOW
Glucose, Bld: 155 mg/dL — ABNORMAL HIGH (ref 70–99)
Potassium: 3.9 mmol/L (ref 3.5–5.1)
Sodium: 140 mmol/L (ref 135–145)

## 2024-10-08 LAB — CBC
HCT: 32.8 % — ABNORMAL LOW (ref 36.0–46.0)
Hemoglobin: 10.7 g/dL — ABNORMAL LOW (ref 12.0–15.0)
MCH: 29.3 pg (ref 26.0–34.0)
MCHC: 32.6 g/dL (ref 30.0–36.0)
MCV: 89.9 fL (ref 80.0–100.0)
Platelets: 236 K/uL (ref 150–400)
RBC: 3.65 MIL/uL — ABNORMAL LOW (ref 3.87–5.11)
RDW: 14.7 % (ref 11.5–15.5)
WBC: 9.7 K/uL (ref 4.0–10.5)
nRBC: 0 % (ref 0.0–0.2)

## 2024-10-08 LAB — GLUCOSE, CAPILLARY: Glucose-Capillary: 130 mg/dL — ABNORMAL HIGH (ref 70–99)

## 2024-10-08 MED ORDER — OXYCODONE HCL 5 MG PO TABS
5.0000 mg | ORAL_TABLET | Freq: Four times a day (QID) | ORAL | 0 refills | Status: AC | PRN
  Filled 2024-10-08: qty 20, 5d supply, fill #0

## 2024-10-08 MED ORDER — ASPIRIN 81 MG PO TBEC
81.0000 mg | DELAYED_RELEASE_TABLET | Freq: Every day | ORAL | 12 refills | Status: AC
  Filled 2024-10-08: qty 90, 90d supply, fill #0

## 2024-10-08 NOTE — Anesthesia Postprocedure Evaluation (Signed)
"   Anesthesia Post Note  Patient: Brandi Peters  Procedure(s) Performed: RIGHT CAROTID ENDARTERECTOMY (Right: Neck) ANGIOPLASTY OF RIGHT CAROTID USING BOVINE PERICARDIUM PATCH GRAFT (Right: Neck)     Patient location during evaluation: PACU Anesthesia Type: General Level of consciousness: awake and alert Pain management: pain level controlled Vital Signs Assessment: post-procedure vital signs reviewed and stable Respiratory status: spontaneous breathing, nonlabored ventilation, respiratory function stable and patient connected to nasal cannula oxygen Cardiovascular status: blood pressure returned to baseline and stable Postop Assessment: no apparent nausea or vomiting Anesthetic complications: no   No notable events documented.  Last Vitals:  Vitals:   10/07/24 2319 10/08/24 0352  BP: (!) 120/51 (!) 107/44  Pulse: 86 73  Resp: 18 17  Temp: 36.7 C 36.6 C  SpO2: 97% 97%    Last Pain:  Vitals:   10/08/24 0621  TempSrc:   PainSc: 0-No pain                 Lynwood [name]      "

## 2024-10-08 NOTE — Progress Notes (Signed)
 RNCM received DME order for RW.  Jermaine at [facility] contacted with order and confirmation received.  RW to be delivered to patient's room prior to d/c home.

## 2024-10-08 NOTE — Evaluation (Addendum)
 Physical Therapy Evaluation Patient Details Name: Brandi Peters MRN: [id number] DOB: 1951/01/22 Today's Date: 10/08/2024  History of Present Illness  The pt is a 73 yo female presenting 12/19 for R carotid endarterectomy with stent placement due to R ICA stenosis. PMH includes: anxiety, arthritis, DM II, fibromyalgia, gout, HTN, and HLD.  Clinical Impression  Pt in bed upon arrival of PT, agreeable to evaluation at this time. Prior to admission the pt was independent with use of single point cane, living in home with 3 stairs to enter with her son. The pt reports she was independent with gait without falls, and independent with ADLs and IADLs, still driving and active in community. The pt presents today with limited endurance, stability, and strength. She was able to stand without assist, but is dependent on BUE support for gait, and fatigues quickly needing up to minA to manage RW and max cues for standing and seated rest break. Given decline in mobility, endurance, and stability, pt will benefit from RW for home use and HHPT to progress activity tolerance and wean back to safe use of SPC. Pt is at high risk for falls at this time, educated pt and family on energy conservation strategies and use of chairs throughout home to provide seated rest with mobility.   BP: 138/45 (71) HR: 89 after gait BP: 126/42 (65) HR: 87 sitting in recliner    If plan is discharge home, recommend the following: A little help with walking and/or transfers;A little help with bathing/dressing/bathroom;Help with stairs or ramp for entrance;Assistance with cooking/housework   Can travel by private vehicle        Equipment Recommendations Rolling walker (2 wheels)  Recommendations for Other Services       Functional Status Assessment Patient has had a recent decline in their functional status and demonstrates the ability to make significant improvements in function in a reasonable and predictable amount of time.      Precautions / Restrictions Precautions Precautions: Fall Recall of Precautions/Restrictions: Intact Restrictions Weight Bearing Restrictions Per Provider Order: No      Mobility  Bed Mobility               General bed mobility comments: OOB in recliner at start and end of session    Transfers Overall transfer level: Needs assistance Equipment used: Rolling walker (2 wheels) Transfers: Sit to/from Stand Sit to Stand: Supervision           General transfer comment: increased time, no LOB    Ambulation/Gait Ambulation/Gait assistance: Contact guard assist, Min assist Gait Distance (Feet): 65 Feet (+ 65 ft) Assistive device: Rolling walker (2 wheels) Gait Pattern/deviations: Step-through pattern, Decreased stride length, Shuffle, Drifts right/left, Trunk flexed Gait velocity: decreased Gait velocity interpretation: <1.31 ft/sec, indicative of household ambulator   General Gait Details: pt with progressive increased reliance on RW, increased standing rest, trunk flexion, and leaning on forearms on RW. encouraged to take standing or seated rest.     Balance Overall balance assessment: Needs assistance Sitting-balance support: No upper extremity supported, Feet supported Sitting balance-Leahy Scale: Good     Standing balance support: Bilateral upper extremity supported, During functional activity Standing balance-Leahy Scale: Poor Standing balance comment: dependent on BUE support for mobility                             Pertinent Vitals/Pain Pain Assessment Pain Assessment: 0-10 Pain Score: 8  Pain Location: R neck incision  Pain Descriptors / Indicators: Discomfort, Grimacing, Guarding Pain Intervention(s): Limited activity within patient's tolerance, Monitored during session, Repositioned    Home Living Family/patient expects to be discharged to:: Private residence Living Arrangements: Children Available Help at Discharge:  Family;Available PRN/intermittently Type of Home: House Home Access: Stairs to enter Entrance Stairs-Rails: None Entrance Stairs-Number of Steps: 3   Home Layout: One level Home Equipment: Cane - single point;Shower seat;Grab bars - toilet;Grab bars - tub/shower;Hand held shower head      Prior Function Prior Level of Function : Independent/Modified Independent;Working/employed;Driving             Mobility Comments: no falls, 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 ADLs Comments: independent     Extremity/Trunk Assessment   Upper Extremity Assessment Upper Extremity Assessment: Overall WFL for tasks assessed (some guarding in RUE due to pain)    Lower Extremity Assessment Lower Extremity Assessment: Overall WFL for tasks assessed (grossly 4+/5 bilaterally)    Cervical / Trunk Assessment Cervical / Trunk Assessment: Other exceptions Cervical / Trunk Exceptions: incision to R neck, increased body habitus  Communication   Communication Communication: No apparent difficulties    Cognition Arousal: Alert Behavior During Therapy: WFL for tasks assessed/performed   PT - Cognitive impairments: Safety/Judgement                       PT - Cognition Comments: pt with limited acknowledgement of fatigue and need for increased support and seated rest Following commands: Intact       Cueing Cueing Techniques: Verbal cues     General Comments General comments (skin integrity, edema, etc.): BP soft but stable, 138/45 (71) after gait, 126/42 (65) after returning to sitting in recliner    Exercises     Assessment/Plan    PT Assessment Patient needs continued PT services  PT Problem List Decreased strength;Decreased activity tolerance;Decreased balance;Decreased mobility;Pain       PT Treatment Interventions DME instruction;Stair training;Gait training;Functional mobility training;Therapeutic activities;Therapeutic exercise;Balance  training;Patient/family education    PT Goals (Current goals can be found in the Care Plan section)  Acute Rehab PT Goals Patient Stated Goal: to return home and back to use of cane PT Goal Formulation: With patient/family Time For Goal Achievement: 10/22/24 Potential to Achieve Goals: Good    Frequency Min 2X/week        AM-PAC PT 6 Clicks Mobility  Outcome Measure Help needed turning from your back to your side while in a flat bed without using bedrails?: A Little Help needed moving from lying on your back to sitting on the side of a flat bed without using bedrails?: A Little Help needed moving to and from a bed to a chair (including a wheelchair)?: None Help needed standing up from a chair using your arms (e.g., wheelchair or bedside chair)?: None Help needed to walk in hospital room?: A Little Help needed climbing 3-5 steps with a railing? : A Little 6 Click Score: 20    End of Session Equipment Utilized During Treatment: Gait belt Activity Tolerance: Patient tolerated treatment well Patient left: in chair;with call bell/phone within reach;with family/visitor present Nurse Communication: Mobility status PT Visit Diagnosis: Unsteadiness on feet (R26.81);Other abnormalities of gait and mobility (R26.89);Muscle weakness (generalized) (M62.81)    Time: 9076-9050 PT Time Calculation (min) (ACUTE ONLY): 26 min   Charges:   PT Evaluation $PT Eval Low Complexity: 1 Low PT Treatments $Therapeutic Exercise: 8-22 mins PT General Charges $$ ACUTE PT VISIT: 1  Visit         Izetta Call, PT, DPT   Acute Rehabilitation Department Office [phone] Secure Chat Communication Preferred  Izetta JULIANNA Call 10/08/2024, 9:59 AM

## 2024-10-08 NOTE — Progress Notes (Addendum)
" °  Progress Note    10/08/2024 8:27 AM 1 Day Post-Op  Subjective:  sitting up in chair. Says mild pain along incision. Tolerating diet. Ambulated in hall yesterday evening. Denies any trouble swallowing or talking   Vitals:   10/07/24 2319 10/08/24 0352  BP: (!) 120/51 (!) 107/44  Pulse: 86 73  Resp: 18 17  Temp: 98.1 F (36.7 C) 97.9 F (36.6 C)  SpO2: 97% 97%   Physical Exam: Cardiac:  regular Lungs:  non labored Incisions:  right neck incision is clean. Dry and intact. Mild fullness along incision. soft Extremities:  moving all extremities without deficits Neurologic: alert and oriented. Speech coherent. Smile symmetric. Tongue midline  CBC    Component Value Date/Time   WBC 9.7 10/08/2024 0445   RBC 3.65 (L) 10/08/2024 0445   HGB 10.7 (L) 10/08/2024 0445   HCT 32.8 (L) 10/08/2024 0445   PLT 236 10/08/2024 0445   MCV 89.9 10/08/2024 0445   MCH 29.3 10/08/2024 0445   MCHC 32.6 10/08/2024 0445   RDW 14.7 10/08/2024 0445   LYMPHSABS 2.6 02/24/2011 2051   MONOABS 0.7 02/24/2011 2051   EOSABS 0.2 02/24/2011 2051   BASOSABS 0.0 02/24/2011 2051    BMET    Component Value Date/Time   NA 140 10/08/2024 0445   K 3.9 10/08/2024 0445   CL 107 10/08/2024 0445   CO2 24 10/08/2024 0445   GLUCOSE 155 (H) 10/08/2024 0445   BUN 36 (H) 10/08/2024 0445   CREATININE 1.41 (H) 10/08/2024 0445   CALCIUM  8.7 (L) 10/08/2024 0445   GFRNONAA 39 (L) 10/08/2024 0445   GFRAA  02/24/2011 2051    >60        The eGFR has been calculated using the MDRD equation. This calculation has not been validated in all clinical situations. eGFR's persistently <60 mL/min signify possible Chronic Kidney Disease.    INR    Component Value Date/Time   INR 1.1 10/04/2024 1407     Intake/Output Summary (Last 24 hours) at 10/08/2024 0827 Last data filed at 10/08/2024 9373 Gross per 24 hour  Intake 1717.56 ml  Output 425 ml  Net 1292.56 ml     Assessment/Plan:  73 y.o. female is s/p  right CEA 1 Day Post-Op   Neurologically intact Right neck incision clean, dry and intact She has ambulated without issues Tolerating diet She will continue Aspirin , Plavix , Statin She is stable for discharge home today She will follow up in 2-3 weeks for incision check   Teretha Damme, PA-C [facility] [phone] 10/08/2024 8:27 AM  VASCULAR STAFF ADDENDUM: I have independently interviewed and examined the patient. I agree with the above.  Discharge today.  Norman GORMAN Serve MD [facility] Phone Number: [phone] 10/08/2024 8:50 AM  "

## 2024-10-08 NOTE — Progress Notes (Addendum)
 Discharge   Patient expressed verbal understanding of discharge POC.   Patient given time to ask any questions.  Additional education included in AVS.  Alert oriented in good spirits.   Tele and 2 PIV removed.  Pressure dressings intact. CCMD/ Courtney.  Both sons at bedside during education. TOC meds at bedside. Delivered by NT.  RW to be delivered by [facility].   Discharging to MAIN A.

## 2024-10-10 ENCOUNTER — Telehealth: Payer: Self-pay

## 2024-10-10 ENCOUNTER — Encounter (HOSPITAL_COMMUNITY): Payer: Self-pay | Admitting: Vascular Surgery

## 2024-10-10 NOTE — Transitions of Care (Post Inpatient/ED Visit) (Signed)
 "  10/10/2024  Name: Brandi Peters MRN: [id number] DOB: [birth year]  Today's TOC FU Call Status: Today's TOC FU Call Status:: Successful TOC FU Call Completed TOC FU Call Complete Date: 10/10/24  Patient's Name and Date of Birth confirmed. Name, DOB  Transition Care Management Follow-up Telephone Call Date of Discharge: 10/08/24 Discharge Facility: [facility]) Type of Discharge: Inpatient Admission Primary Inpatient Discharge Diagnosis:: Status post carotid endarterectomy How have you been since you were released from the hospital?: Better Any questions or concerns?: No  Items Reviewed: Did you receive and understand the discharge instructions provided?: Yes Medications obtained,verified, and reconciled?: Yes (Medications Reviewed) Any new allergies since your discharge?: No Dietary orders reviewed?: Yes Type of Diet Ordered:: Heart Healthy- carb modified Do you have support at home?: Yes People in Home [RPT]: child(ren), adult Name of Support/Comfort Primary Source: Brandi Peters  Medications Reviewed Today: Medications Reviewed Today     Reviewed by Annais Crafts, RN (Case Manager) on 10/10/24 at 1354  Med List Status: <None>   Medication Order Taking? Sig Documenting Provider Last Dose Status Informant  acetaminophen  (TYLENOL ) 500 MG tablet [id number] Yes Take 1,000-1,500 mg by mouth every 8 (eight) hours as needed for moderate pain. [provider]  Active Self           Med Note HASSEL, [facility]   Fri May 08, 2023  1:58 PM) EMMI/TOC call unable to reconcile medications  allopurinol  (ZYLOPRIM ) 100 MG tablet [id number] Yes Take 100 mg by mouth daily. [provider]  Active Self  aspirin  EC 81 MG tablet [id number] Yes Take 1 tablet (81 mg total) by mouth daily at 6 (six) AM. Swallow whole. Baglia, Corrina, PA-C  Active   augmented betamethasone dipropionate (DIPROLENE-AF) 0.05 % cream [id number] Yes Apply 1 Application topically 2 (two) times daily as needed  (irritation). [provider]  Active Self  azelastine  (ASTELIN ) 0.1 % nasal spray [id number] Yes Place 2 sprays into both nostrils 2 (two) times daily. Use in each nostril as directed Okey Burns, MD  Active Self  Carboxymethylcellulose Sodium (REFRESH TEARS OP) [id number] Yes Place 1 drop into both eyes daily as needed (dry eyes). [provider]  Active Self  clopidogrel  (PLAVIX ) 75 MG tablet [id number] Yes TAKE 1 TABLET (75 MG TOTAL) BY MOUTH DAILY. Ladona Heinz, MD  Active Self  empagliflozin  (JARDIANCE ) 25 MG TABS tablet [id number] Yes Take 1 tablet (25 mg total) by mouth daily before breakfast. Ladona Heinz, MD  Active Self  ezetimibe  (ZETIA ) 10 MG tablet [id number] Yes TAKE 1 TABLET EVERY EVENING Ganji, Jay, MD  Active Self  famotidine  (PEPCID ) 20 MG tablet [id number]  Take 1 tablet (20 mg total) by mouth 2 (two) times daily.  Patient not taking: Reported on 10/10/2024   Soldatova, Liuba, MD  Active Self  furosemide (LASIX) 40 MG tablet [id number] Yes Take 40 mg by mouth daily as needed for edema. [provider]  Active Self  ipratropium (ATROVENT ) 0.03 % nasal spray [id number] Yes Place 2 sprays into both nostrils every 12 (twelve) hours. Soldatova, Liuba, MD  Active Self  labetalol  (NORMODYNE ) 300 MG tablet [id number] Yes Take 300 mg by mouth 2 (two) times daily. [provider]  Active Self  levocetirizine (XYZAL ) 5 MG tablet [id number] Yes TAKE 1 TABLET EVERY EVENING Soldatova, Liuba, MD  Active Self  linaclotide  (LINZESS ) 145 MCG CAPS capsule [id number] Yes Take 145 mcg by mouth daily as needed (constipation). [provider]  Active Self  losartan-hydrochlorothiazide (HYZAAR) 100-25 MG  tablet [id number] Yes Take 1 tablet by mouth daily. [provider]  Active Self  oxyCODONE  (OXY IR/ROXICODONE ) 5 MG immediate release tablet [id number] Yes Take 1 tablet (5 mg total) by mouth every 6 (six) hours as needed for severe pain (pain score 7-10). Baglia,  Corrina, PA-C  Active   PARoxetine  (PAXIL ) 20 MG tablet [id number] Yes Take 20 mg by mouth daily. [provider]  Active Self  rosuvastatin  (CRESTOR ) 40 MG tablet [id number] Yes Take 1 tablet (40 mg total) by mouth daily. Ladona Heinz, MD  Active Self  Semaglutide, 2 MG/DOSE, (OZEMPIC, 2 MG/DOSE,) 8 MG/3ML SOPN [id number] Yes Inject 2 mg into the skin once a week. [provider]  Active Self  TRESIBA FLEXTOUCH 200 UNIT/ML FlexTouch Pen [id number] Yes Inject 25 Units into the skin daily. [provider]  Active Self  Vitamin D, Ergocalciferol, (DRISDOL) 1.25 MG (50000 UNIT) CAPS capsule [id number] Yes Take 50,000 Units by mouth every Wednesday. [provider]  Active Self            Home Care and Equipment/Supplies: Were Home Health Services Ordered?: Yes Name of Home Health Agency:: [facility] Has Agency set up a time to come to your home?: Yes First Home Health Visit Date: 10/09/24 Any new equipment or medical supplies ordered?: NA  Functional Questionnaire: Do you need assistance with bathing/showering or dressing?: No Do you need assistance with meal preparation?: No Do you need assistance with eating?: No Do you have difficulty maintaining continence: No Do you need assistance with getting out of bed/getting out of a chair/moving?: No Do you have difficulty managing or taking your medications?: No  Follow up appointments reviewed: PCP Follow-up appointment confirmed?: Yes Date of PCP follow-up appointment?:  (Patient states appointment within 2 weeks but could not remember the date.) Follow-up Provider: Dr. Gerome [facility] Follow-up appointment confirmed?: No Reason Specialist Follow-Up Not Confirmed: Patient has Specialist Provider Number and will Call for Appointment Do you need transportation to your follow-up appointment?: No Do you understand care options if your condition(s) worsen?: Yes-patient verbalized understanding  SDOH  Interventions Today    Flowsheet Row Most Recent Value  SDOH Interventions   Food Insecurity Interventions Intervention Not Indicated  Housing Interventions Intervention Not Indicated  Transportation Interventions Intervention Not Indicated  Utilities Interventions Intervention Not Indicated    Tarrah Furuta J. Cherene Dobbins RN, MSN [facility]  [facility], [facility] RN Care Manager Direct Dial: [phone]  Fax: [phone] Website: [email or web address]   "

## 2024-10-10 NOTE — Patient Instructions (Signed)
 Visit Information  Thank you for taking time to visit with me today. Please don't hesitate to contact me if I can be of assistance to you before our next scheduled telephone appointment.  Our next appointment is by telephone on 10/18/24 at 1100 am  Following is a copy of your care plan:   Goals Addressed             This Visit's Progress    [facility] Transitions of Care (TOC) Care Plan       Problems:  Recent Hospitalization for treatment of Right Carotid Endarterectomy Knowledge Deficit Related to Right Carotid Endarterectomy  Goal:  Over the next 30 days, the patient will not experience hospital readmission  Interventions:  Transitions of Care: Doctor Visits  - discussed the importance of doctor visits Post discharge activity limitations prescribed by provider reviewed Post-op wound/incision care reviewed with patient/caregiver Reviewed Signs and symptoms of infection  Patient Self Care Activities:  Attend all scheduled provider appointments Call pharmacy for medication refills 3-7 days in advance of running out of medications Call provider office for new concerns or questions  Notify RN Care Manager of TOC call rescheduling needs Participate in Transition of Care Program/Attend TOC scheduled calls Take medications as prescribed   Monitor for swelling, redness, pain, pus, and fever Keep Right Neck  Incision clean and dry.   Notify physician immediately for any changes   Plan:  The patient has been provided with contact information for the care management team and has been advised to call with any health related questions or concerns.         Patient verbalizes understanding of instructions and care plan provided today and agrees to view in MyChart. Active MyChart status and patient understanding of how to access instructions and care plan via MyChart confirmed with patient.     The patient has been provided with contact information for the care management team and has  been advised to call with any health related questions or concerns.   Please call the care guide team at [phone] if you need to cancel or reschedule your appointment.   Please call the Suicide and Crisis Lifeline: 988 call the [place]  National Suicide Prevention Lifeline: 361-051-9914 or TTY: 7253780692 TTY 608-439-8407) to talk to a trained counselor if you are experiencing a Mental Health or Behavioral Health Crisis or need someone to talk to.  Seva Chancy J. Tiani Stanbery RN, MSN [facility], [facility], [place] [facility] RN Care Manager Direct Dial: [phone]  Fax: [phone] Website: [email or web address]

## 2024-10-10 NOTE — Discharge Summary (Signed)
 " Carotid Discharge Summary     Brandi Peters January 04, 1951 73 y.o. female  [id number]  Admission Date: 10/07/2024  Discharge Date: 10/08/2024  Physician: Dr. Penne Colorado  Admission Diagnosis: Bilateral carotid artery stenosis [I65.23] Status post carotid endarterectomy [Z98.890] Carotid stenosis, asymptomatic, right [I65.21]  Discharge Day services:   PT/ [facility] Course:  The patient was admitted to the hospital and taken to the operating room on 10/07/2024 and underwent right carotid endarterectomy.  The pt tolerated the procedure well and was transported to the PACU in good condition.  By POD 1, the pt neuro status remained intact. Mild incisional soreness. Incision clean, dry and intact without swelling or hematoma.Tolerating diet. Ambulating without difficulty. No trouble swallowing or talking.   The remainder of the hospital course consisted of increasing mobilization and increasing intake of solids without difficulty.  She remained stable for discharge home. She will resume all home medications as prescribed. She will continue on Aspirin , Statin and Plavix . Prescription for Aspirin  was sent to her pharmacy as well. PDMP was reviewed and post operative pain medication was sent to her pharmacy. She will follow up in our office in 2-3 weeks for incision check.   Recent Labs    10/08/24 0445  NA 140  K 3.9  CL 107  CO2 24  GLUCOSE 155*  BUN 36*  CALCIUM  8.7*   Recent Labs    10/07/24 1350 10/08/24 0445  WBC 9.3 9.7  HGB 10.9* 10.7*  HCT 33.9* 32.8*  PLT 239 236   No results for input(s): INR in the last 72 hours.   Discharge Instructions     CAROTID Sugery: Call MD for difficulty swallowing or speaking; weakness in arms or legs that is a new symtom; severe headache.  If you have increased swelling in the neck and/or  are having difficulty breathing, CALL 911   Complete by: As directed    Call MD for:  redness, tenderness, or signs of infection  (pain, swelling, bleeding, redness, odor or green/yellow discharge around incision site)   Complete by: As directed    Call MD for:  severe or increased pain, loss or decreased feeling  in affected limb(s)   Complete by: As directed    Call MD for:  temperature >100.5   Complete by: As directed    Discharge wound care:   Complete by: As directed    Keep incision dry for 24 hours. You can then wash with mild soap and water, pat dry. Do not soak in bathtub, pool, etc   Driving Restrictions   Complete by: As directed    No driving for 2 weeks or while taking narcotic pain medication   Increase activity slowly   Complete by: As directed    Walk with assistance use walker or cane as needed   Lifting restrictions   Complete by: As directed    No lifting for 3 weeks   Resume previous diet   Complete by: As directed        Discharge Diagnosis:  Bilateral carotid artery stenosis [I65.23] Status post carotid endarterectomy [Z98.890] Carotid stenosis, asymptomatic, right [I65.21]  Secondary Diagnosis: Patient Active Problem List   Diagnosis Date Noted   Status post carotid endarterectomy 10/07/2024   Carotid stenosis, asymptomatic, right 10/07/2024   Peripheral vascular disorder due to diabetes mellitus (HCC) 05/31/2024   Diabetic nephropathy (HCC) 05/31/2024   Gout 05/31/2024   Heart murmur 05/31/2024   Malignant hypertensive chronic kidney disease 05/31/2024   Morbid  obesity (HCC) 05/31/2024   Stage 3a chronic kidney disease (HCC) 05/31/2024   Stenosis of right carotid artery 05/31/2024   Vitamin D deficiency 05/31/2024   Nonrheumatic aortic valve disorder, unspecified 04/14/2024   Plantar fasciitis of left foot 02/09/2023   Pain in left foot 02/03/2023   HTN (hypertension) 03/24/2019   Hyperlipemia 03/24/2019   Type 2 diabetes mellitus with complication, without long-term current use of insulin  (HCC) 03/24/2019   Past Medical History:  Diagnosis Date   Allergy    Anxiety     Arthritis    Carotid artery occlusion    Chronic kidney disease    Diabetes mellitus without complication (HCC)    Fibromyalgia    GERD (gastroesophageal reflux disease)    Gout    Heart murmur    HTN (hypertension) 03/24/2019   Hyperlipemia 03/24/2019   Type 2 diabetes mellitus with complication, without long-term current use of insulin  (HCC) 03/24/2019    Allergies as of 10/08/2024       Reactions   Pioglitazone Shortness Of Breath, Swelling   Metformin Hcl Diarrhea   diarrhea with high doses        Medication List     TAKE these medications    acetaminophen  500 MG tablet Commonly known as: TYLENOL  Take 1,000-1,500 mg by mouth every 8 (eight) hours as needed for moderate pain.   allopurinol  100 MG tablet Commonly known as: ZYLOPRIM  Take 100 mg by mouth daily.   aspirin  EC 81 MG tablet Take 1 tablet (81 mg total) by mouth daily at 6 (six) AM. Swallow whole.   augmented betamethasone dipropionate 0.05 % cream Commonly known as: DIPROLENE-AF Apply 1 Application topically 2 (two) times daily as needed (irritation).   azelastine  0.1 % nasal spray Commonly known as: ASTELIN  Place 2 sprays into both nostrils 2 (two) times daily. Use in each nostril as directed   clopidogrel  75 MG tablet Commonly known as: PLAVIX  TAKE 1 TABLET (75 MG TOTAL) BY MOUTH DAILY.   empagliflozin  25 MG Tabs tablet Commonly known as: Jardiance  Take 1 tablet (25 mg total) by mouth daily before breakfast.   ezetimibe  10 MG tablet Commonly known as: ZETIA  TAKE 1 TABLET EVERY EVENING   famotidine  20 MG tablet Commonly known as: PEPCID  Take 1 tablet (20 mg total) by mouth 2 (two) times daily.   furosemide 40 MG tablet Commonly known as: LASIX Take 40 mg by mouth daily as needed for edema.   ipratropium 0.03 % nasal spray Commonly known as: ATROVENT  Place 2 sprays into both nostrils every 12 (twelve) hours.   labetalol  300 MG tablet Commonly known as: NORMODYNE  Take 300 mg by  mouth 2 (two) times daily.   levocetirizine 5 MG tablet Commonly known as: XYZAL  TAKE 1 TABLET EVERY EVENING   Linzess  145 MCG Caps capsule Generic drug: linaclotide  Take 145 mcg by mouth daily as needed (constipation).   losartan-hydrochlorothiazide 100-25 MG tablet Commonly known as: HYZAAR Take 1 tablet by mouth daily.   oxyCODONE  5 MG immediate release tablet Commonly known as: Oxy IR/ROXICODONE  Take 1 tablet (5 mg total) by mouth every 6 (six) hours as needed for severe pain (pain score 7-10).   Ozempic (2 MG/DOSE) 8 MG/3ML Sopn Generic drug: Semaglutide (2 MG/DOSE) Inject 2 mg into the skin once a week.   PARoxetine  20 MG tablet Commonly known as: PAXIL  Take 20 mg by mouth daily.   REFRESH TEARS OP Place 1 drop into both eyes daily as needed (dry eyes).   rosuvastatin   40 MG tablet Commonly known as: CRESTOR  Take 1 tablet (40 mg total) by mouth daily.   Tresiba FlexTouch 200 UNIT/ML FlexTouch Pen Generic drug: insulin  degludec Inject 25 Units into the skin daily.   Vitamin D (Ergocalciferol) 1.25 MG (50000 UNIT) Caps capsule Commonly known as: DRISDOL Take 50,000 Units by mouth every Wednesday.               Discharge Care Instructions  (From admission, onward)           Start     Ordered   10/08/24 0000  Discharge wound care:       Comments: Keep incision dry for 24 hours. You can then wash with mild soap and water, pat dry. Do not soak in bathtub, pool, etc   10/08/24 0833             Discharge Instructions:   [facility] Discharge Instructions Carotid Endarterectomy (CEA)  Please refer to the following instructions for your post-procedure care. Your surgeon or physician assistant will discuss any changes with you.  Activity  You are encouraged to walk as much as you can. You can slowly return to normal activities but must avoid strenuous activity and heavy lifting until your doctor tell you it's OK.  Avoid activities such as vacuuming or swinging a golf club. You can drive after one week if you are comfortable and you are no longer taking prescription pain medications. It is normal to feel tired for serval weeks after your surgery. It is also normal to have difficulty with sleep habits, eating, and bowel movements after surgery. These will go away with time.  Bathing/Showering  You may shower after you come home. Do not soak in a bathtub, hot tub, or swim until the incision heals completely.  Incision Care  Shower every day. Clean your incision with mild soap and water. Pat the area dry with a clean towel. You do not need a bandage unless otherwise instructed. Do not apply any ointments or creams to your incision. You may have skin glue on your incision. Do not peel it off. It will come off on its own in about one week. Your incision may feel thickened and raised for several weeks after your surgery. This is normal and the skin will soften over time. For Men Only: It's OK to shave around the incision but do not shave the incision itself for 2 weeks. It is common to have numbness under your chin that could last for several months.  Diet  Resume your normal diet. There are no special food restrictions following this procedure. A low fat/low cholesterol diet is recommended for all patients with vascular disease. In order to heal from your surgery, it is CRITICAL to get adequate nutrition. Your body requires vitamins, minerals, and protein. Vegetables are the best source of vitamins and minerals. Vegetables also provide the perfect balance of protein. Processed food has little nutritional value, so try to avoid this.  Medications  Resume taking all of your medications unless your doctor or physician assistant tells you not to.  If your incision is causing pain, you may take over-the- counter pain relievers such as acetaminophen  (Tylenol ). If you were prescribed a stronger pain medication, please be  aware these medications can cause nausea and constipation.  Prevent nausea by taking the medication with a snack or meal. Avoid constipation by drinking plenty of fluids and eating foods with a high amount of fiber, such as fruits, vegetables,  and grains. Do not take Tylenol  if you are taking prescription pain medications.  Follow Up  Our office will schedule a follow up appointment 2-3 weeks following discharge.  Please call us  immediately for any of the following conditions  Increased pain, redness, drainage (pus) from your incision site. Fever of 101 degrees or higher. If you should develop stroke (slurred speech, difficulty swallowing, weakness on one side of your body, loss of vision) you should call 911 and go to the nearest emergency room.  Reduce your risk of vascular disease:  Stop smoking. If you would like help call [facility] at 1-800-QUIT-NOW (226-554-7423) or [facility] at [phone]. Manage your cholesterol Maintain a desired weight Control your diabetes Keep your blood pressure down  If you have any questions, please call the office at [phone].  Prescriptions given: Oxycodone  5 mg #20 No Refill Aspirin  81 mg #30 11 refills  Disposition: home  Patient's condition: is Good  Follow up: 1. Dr. Sheree in 2-3 weeks.   Roslind Michaux PA-C [facility] [phone]   --- For [employer] [facility] [employer] use ---   Modified Rankin score at D/C (0-6): 0  IV medication needed for:  1. Hypertension: No 2. Hypotension: No  Post-op Complications: No  1. Post-op CVA or TIA: No  If yes: Event classification (right eye, left eye, right cortical, left cortical, verterobasilar, other): n/a  If yes: Timing of event (intra-op, <6 hrs post-op, >=6 hrs post-op, unknown): n/a  2. CN injury: No  If yes: CN not injuried   3. Myocardial infarction: No  If yes: Dx by (EKG or clinical, Troponin): n/a  4.  CHF: No  5.  Dysrhythmia (new): No  6. Wound  infection: No  7. Reperfusion symptoms: No  8. Return to OR: No  If yes: return to OR for (bleeding, neurologic, other CEA incision, other): n/a  Discharge medications: Statin use:  Yes ASA use:  Yes   Beta blocker use:  Yes ACE-Inhibitor use:  No  ARB use:  Yes CCB use: No P2Y12 Antagonist use: Yes, [email or web address].GANT ] Plavix , [ ]  Plasugrel, [ ]  Ticlopinine, [ ]  Ticagrelor, [ ]  Other, [ ]  No for medical reason, [ ]  Non-compliant, [ ]  Not-indicated Anti-coagulant use:  No, [ ]  Warfarin, [ ]  Rivaroxaban, [ ]  Dabigatran,   "

## 2024-10-18 ENCOUNTER — Other Ambulatory Visit: Payer: Self-pay

## 2024-10-18 NOTE — Patient Instructions (Signed)
 Visit Information  Thank you for taking time to visit with me today. Please don't hesitate to contact me if I can be of assistance to you before our next scheduled telephone appointment.  Our next appointment is by telephone on 10/27/24 at 130 pm  Following is a copy of your care plan:   Goals Addressed             This Visit's Progress    [facility] Transitions of Care (TOC) Care Plan       Problems:  Recent Hospitalization for treatment of Right Carotid Endarterectomy Knowledge Deficit Related to Right Carotid Endarterectomy  Goal:  Over the next 30 days, the patient will not experience hospital readmission  Interventions:  Transitions of Care: Doctor Visits  - discussed the importance of doctor visits Post discharge activity limitations prescribed by provider reviewed Reviewed Signs and symptoms of infection  Patient Self Care Activities:  Attend all scheduled provider appointments Call pharmacy for medication refills 3-7 days in advance of running out of medications Call provider office for new concerns or questions  Notify RN Care Manager of TOC call rescheduling needs Participate in Transition of Care Program/Attend TOC scheduled calls Take medications as prescribed   Monitor for swelling, redness, pain, pus, and fever Keep Right Neck  Incision clean and dry.   Notify physician immediately for any changes   Plan:  The patient has been provided with contact information for the care management team and has been advised to call with any health related questions or concerns.         Patient verbalizes understanding of instructions and care plan provided today and agrees to view in MyChart. Active MyChart status and patient understanding of how to access instructions and care plan via MyChart confirmed with patient.     The patient has been provided with contact information for the care management team and has been advised to call with any health related questions or  concerns.   Please call the care guide team at [phone] if you need to cancel or reschedule your appointment.   Please call the Suicide and Crisis Lifeline: 988 if you are experiencing a Mental Health or Behavioral Health Crisis or need someone to talk to.  Brandi Peters J. Kennon Encinas RN, MSN [facility], [facility] RN Care Manager Direct Dial: [phone]  Fax: [phone] Website: [email or web address]

## 2024-10-18 NOTE — Transitions of Care (Post Inpatient/ED Visit) (Signed)
 " Transition of Care week 2  Visit Note  10/18/2024  Name: Brandi Peters MRN: [id number]          DOB: 15-Jun-1951  Situation: Patient enrolled in [facility] 30-day program. Visit completed with patient by telephone.   Background:   Initial Transition Care Management Follow-up Telephone Call Discharge Date and Diagnosis: 10/08/24, Status post carotid endarterectomy   Past Medical History:  Diagnosis Date   Allergy    Anxiety    Arthritis    Carotid artery occlusion    Chronic kidney disease    Diabetes mellitus without complication (HCC)    Fibromyalgia    GERD (gastroesophageal reflux disease)    Gout    Heart murmur    HTN (hypertension) 03/24/2019   Hyperlipemia 03/24/2019   Type 2 diabetes mellitus with complication, without long-term current use of insulin  (HCC) 03/24/2019    Assessment: Patient Reported Symptoms: Cognitive Cognitive Status: Alert and oriented to person, place, and time, Normal speech and language skills      Neurological Neurological Review of Symptoms: No symptoms reported    HEENT HEENT Symptoms Reported: No symptoms reported      Cardiovascular Cardiovascular Symptoms Reported: No symptoms reported Cardiovascular Management Strategies: Routine screening, Medication therapy Cardiovascular Self-Management Outcome: 4 (good) Cardiovascular Comment: Recent right carotid Endarterectomy. Advised to Call MD for difficulty swallowing or speaking; weakness in arms or legs that is a new symptom; severe headache. If you have increased swelling in the neck and/or are having difficulty breathing, CALL 911 Call MD for: redness, tenderness, or signs of infection (pain, swelling, bleeding, redness, odor or green/yellow discharge around incision site) Call MD for: severe or increased pain, loss or decreased feeling in affected limb(s) Call MD for: temperature >100.5 Discharge wound care: Keep incision dry for 24 hours. You can then wash with mild soap and water, pat dry.  Do not soak in bathtub, pool, etc Driving Restrictions No driving for 2 weeks or while taking narcotic pain medication Increase activity slowly Walk with assistance use walker or cane as needed Lifting restrictions No lifting for 3 weeks  Respiratory Respiratory Symptoms Reported: No symptoms reported    Endocrine Endocrine Symptoms Reported: No symptoms reported Is patient diabetic?: Yes Is patient checking blood sugars at home?: No Endocrine Self-Management Outcome: 4 (good)  Gastrointestinal Gastrointestinal Symptoms Reported: No symptoms reported      Genitourinary Genitourinary Symptoms Reported: No symptoms reported    Integumentary Integumentary Symptoms Reported: Incision Additional Integumentary Details: Recent right endarterectomy. Right neck incision per patient. Reviewed signs of infection. Skin Management Strategies: Fluid modification Skin Self-Management Outcome: 4 (good)  Musculoskeletal Musculoskelatal Symptoms Reviewed: Weakness Additional Musculoskeletal Details: Uses cane outside the home Musculoskeletal Management Strategies: Exercise Musculoskeletal Self-Management Outcome: 4 (good)      Psychosocial Psychosocial Symptoms Reported: No symptoms reported         There were no vitals filed for this visit. Pain Scale: 0-10 Pain Score: 0-No pain  Medications Reviewed Today     Reviewed by Danaka Llera, RN (Case Manager) on 10/18/24 at 1101  Med List Status: <None>   Medication Order Taking? Sig Documenting Provider Last Dose Status Informant  acetaminophen  (TYLENOL ) 500 MG tablet [id number] Yes Take 1,000-1,500 mg by mouth every 8 (eight) hours as needed for moderate pain. [provider]  Active Self           Med Note HASSEL, [facility]   Fri May 08, 2023  1:58 PM) EMMI/TOC call unable to reconcile medications  allopurinol  (ZYLOPRIM ) 100 MG tablet [id number] Yes Take 100 mg by mouth daily. [provider]  Active Self  aspirin  EC 81 MG tablet  [id number] Yes Take 1 tablet (81 mg total) by mouth daily at 6 (six) AM. Swallow whole. Baglia, Corrina, PA-C  Active   augmented betamethasone dipropionate (DIPROLENE-AF) 0.05 % cream [id number] Yes Apply 1 Application topically 2 (two) times daily as needed (irritation). [provider]  Active Self  azelastine  (ASTELIN ) 0.1 % nasal spray [id number] Yes Place 2 sprays into both nostrils 2 (two) times daily. Use in each nostril as directed Okey Burns, MD  Active Self  Carboxymethylcellulose Sodium (REFRESH TEARS OP) [id number] Yes Place 1 drop into both eyes daily as needed (dry eyes). [provider]  Active Self  clopidogrel  (PLAVIX ) 75 MG tablet [id number] Yes TAKE 1 TABLET (75 MG TOTAL) BY MOUTH DAILY. Ladona Heinz, MD  Active Self  empagliflozin  (JARDIANCE ) 25 MG TABS tablet [id number] Yes Take 1 tablet (25 mg total) by mouth daily before breakfast. Ladona Heinz, MD  Active Self  ezetimibe  (ZETIA ) 10 MG tablet [id number] Yes TAKE 1 TABLET EVERY EVENING Ganji, Jay, MD  Active Self  famotidine  (PEPCID ) 20 MG tablet [id number]  Take 1 tablet (20 mg total) by mouth 2 (two) times daily.  Patient not taking: Reported on 10/18/2024   Soldatova, Liuba, MD  Active Self  furosemide (LASIX) 40 MG tablet [id number] Yes Take 40 mg by mouth daily as needed for edema. [provider]  Active Self  ipratropium (ATROVENT ) 0.03 % nasal spray [id number] Yes Place 2 sprays into both nostrils every 12 (twelve) hours. Soldatova, Liuba, MD  Active Self  labetalol  (NORMODYNE ) 300 MG tablet [id number] Yes Take 300 mg by mouth 2 (two) times daily. [provider]  Active Self  levocetirizine (XYZAL ) 5 MG tablet [id number] Yes TAKE 1 TABLET EVERY EVENING Soldatova, Liuba, MD  Active Self  linaclotide  (LINZESS ) 145 MCG CAPS capsule [id number] Yes Take 145 mcg by mouth daily as needed (constipation). [provider]  Active Self  losartan-hydrochlorothiazide (HYZAAR) 100-25 MG tablet  [id number] Yes Take 1 tablet by mouth daily. [provider]  Active Self  oxyCODONE  (OXY IR/ROXICODONE ) 5 MG immediate release tablet [id number] Yes Take 1 tablet (5 mg total) by mouth every 6 (six) hours as needed for severe pain (pain score 7-10). Baglia, Corrina, PA-C  Active   PARoxetine  (PAXIL ) 20 MG tablet [id number] Yes Take 20 mg by mouth daily. [provider]  Active Self  rosuvastatin  (CRESTOR ) 40 MG tablet [id number] Yes Take 1 tablet (40 mg total) by mouth daily. Ladona Heinz, MD  Active Self  Semaglutide, 2 MG/DOSE, (OZEMPIC, 2 MG/DOSE,) 8 MG/3ML SOPN [id number] Yes Inject 2 mg into the skin once a week. [provider]  Active Self  TRESIBA FLEXTOUCH 200 UNIT/ML FlexTouch Pen [id number] Yes Inject 25 Units into the skin daily. [provider]  Active Self  Vitamin D, Ergocalciferol, (DRISDOL) 1.25 MG (50000 UNIT) CAPS capsule [id number] Yes Take 50,000 Units by mouth every Wednesday. [provider]  Active Self            Goals Addressed             This Visit's Progress    [facility] Transitions of Care (TOC) Care Plan       Problems:  Recent Hospitalization for treatment of Right Carotid Endarterectomy Knowledge Deficit Related to Right Carotid Endarterectomy  Goal:  Over the next 30 days, the patient will not experience  hospital readmission  Interventions:  Transitions of Care: Doctor Visits  - discussed the importance of doctor visits Post discharge activity limitations prescribed by provider reviewed Reviewed Signs and symptoms of infection  Patient Self Care Activities:  Attend all scheduled provider appointments Call pharmacy for medication refills 3-7 days in advance of running out of medications Call provider office for new concerns or questions  Notify RN Care Manager of TOC call rescheduling needs Participate in Transition of Care Program/Attend TOC scheduled calls Take medications as prescribed   Monitor for swelling,  redness, pain, pus, and fever Keep Right Neck  Incision clean and dry.   Notify physician immediately for any changes   Plan:  The patient has been provided with contact information for the care management team and has been advised to call with any health related questions or concerns.         Recommendation:   Continue Current Plan of Care  Follow Up Plan:   Telephone follow-up in 1 week  Taeshaun Rames J. Gavriella Hearst RN, MSN [facility], [facility] RN Care Manager Direct Dial: [phone]  Fax: [phone] Website: [email or web address]      "

## 2024-10-26 ENCOUNTER — Encounter: Payer: Self-pay | Admitting: Vascular Surgery

## 2024-10-26 ENCOUNTER — Ambulatory Visit: Attending: Vascular Surgery | Admitting: Physician Assistant

## 2024-10-26 VITALS — BP 127/71 | HR 83 | Temp 97.7°F | Wt 239.9 lb

## 2024-10-26 DIAGNOSIS — I6521 Occlusion and stenosis of right carotid artery: Secondary | ICD-10-CM

## 2024-10-26 DIAGNOSIS — I6523 Occlusion and stenosis of bilateral carotid arteries: Secondary | ICD-10-CM

## 2024-10-26 NOTE — Progress Notes (Signed)
 " POST OPERATIVE OFFICE NOTE    CC:  F/u for surgery  HPI:  This is a 74 y.o. female who is s/p Right CEA on 10/07/24 by Dr. Sheree. This was for asymptomatic high grade right ICA stenosis. She did very well post operatively and was discharged home POD#1.   Pt returns today for follow up.  Pt states overall she is doing very well. She denies any trouble swallowing or talking. She has had very minimal neck soreness. She has felt there to be some fullness/ swelling along the incision. She has been washing it with antibacterial soap. She denies any visual changes, slurred speech, facial drooping, unilateral upper or lower extremity weakness or numbness. She is medically managed on Aspirin , Statin and Plavix . 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    Allergies[1]  Current Outpatient Medications  Medication Sig Dispense Refill   acetaminophen  (TYLENOL ) 500 MG tablet Take 1,000-1,500 mg by mouth every 8 (eight) hours as needed for moderate pain.     allopurinol  (ZYLOPRIM ) 100 MG tablet Take 100 mg by mouth daily.     aspirin  EC 81 MG tablet Take 1 tablet (81 mg total) by mouth daily at 6 (six) AM. Swallow whole. 90 each 12   augmented betamethasone dipropionate (DIPROLENE-AF) 0.05 % cream Apply 1 Application topically 2 (two) times daily as needed (irritation).     azelastine  (ASTELIN ) 0.1 % nasal spray Place 2 sprays into both nostrils 2 (two) times daily. Use in each nostril as directed 30 mL 12   Carboxymethylcellulose Sodium (REFRESH TEARS OP) Place 1 drop into both eyes daily as needed (dry eyes).     clopidogrel  (PLAVIX ) 75 MG tablet TAKE 1 TABLET (75 MG TOTAL) BY MOUTH DAILY. 90 tablet 3   empagliflozin  (JARDIANCE ) 25 MG TABS tablet Take 1 tablet (25 mg total) by mouth daily before breakfast. 90 tablet 3   ezetimibe  (ZETIA ) 10 MG tablet TAKE 1 TABLET EVERY EVENING 90 tablet 3   famotidine  (PEPCID ) 20 MG tablet Take 1 tablet (20 mg total) by mouth 2  (two) times daily. (Patient not taking: Reported on 10/18/2024) 30 tablet 1   furosemide (LASIX) 40 MG tablet Take 40 mg by mouth daily as needed for edema.     ipratropium (ATROVENT ) 0.03 % nasal spray Place 2 sprays into both nostrils every 12 (twelve) hours. 30 mL 12   labetalol  (NORMODYNE ) 300 MG tablet Take 300 mg by mouth 2 (two) times daily.     levocetirizine (XYZAL ) 5 MG tablet TAKE 1 TABLET EVERY EVENING 90 tablet 3   linaclotide  (LINZESS ) 145 MCG CAPS capsule Take 145 mcg by mouth daily as needed (constipation).     losartan-hydrochlorothiazide (HYZAAR) 100-25 MG tablet Take 1 tablet by mouth daily.     oxyCODONE  (OXY IR/ROXICODONE ) 5 MG immediate release tablet Take 1 tablet (5 mg total) by mouth every 6 (six) hours as needed for severe pain (pain score 7-10). 20 tablet 0   PARoxetine  (PAXIL ) 20 MG tablet Take 20 mg by mouth daily.     rosuvastatin  (CRESTOR ) 40 MG tablet Take 1 tablet (40 mg total) by mouth daily. 90 tablet 3   Semaglutide, 2 MG/DOSE, (OZEMPIC, 2 MG/DOSE,) 8 MG/3ML SOPN Inject 2 mg into the skin once a week.     TRESIBA FLEXTOUCH 200 UNIT/ML FlexTouch Pen Inject 25 Units into the skin daily.     Vitamin D, Ergocalciferol, (DRISDOL) 1.25 MG (50000 UNIT) CAPS capsule Take 50,000  Units by mouth every Wednesday.     No current facility-administered medications for this visit.     ROS:  See HPI  Physical Exam:  Vitals:   10/26/24 1258 10/26/24 1301  BP: (!) 147/79 127/71  Pulse: 86 83  Temp: 97.7 F (36.5 C)    Incision:  right neck incision intact and well appearing, healing ridge present. Some of remaining Dermabond I removed. Otherwise incision healing well  Extremities:  moving all extremities without deficits Neuro: alert and oriented. Speech coherent.   Assessment/Plan:  This is a 74 y.o. female who is s/p:Right CEA on 10/07/24 by Dr. Sheree. This was for asymptomatic high grade right ICA stenosis. She is without any TIA or stroke like symptoms. Her right  neck incision is intact and healing very well.  - Okay to return to work/ volunteering on 10/31/24 -Continue Aspirin , Plavix , Statin - She will follow up in 9 months with carotid duplex   Curry Damme, [facility] [phone]   Clinic MD:  Sheree     [1]  Allergies Allergen Reactions   Pioglitazone Shortness Of Breath and Swelling   Metformin Hcl Diarrhea    diarrhea with high doses   "

## 2024-10-27 ENCOUNTER — Other Ambulatory Visit: Payer: Self-pay

## 2024-10-27 NOTE — Transitions of Care (Post Inpatient/ED Visit) (Signed)
 " Transition of Care week 3  Visit Note  10/27/2024  Name: Brandi Peters MRN: [id number]          DOB: 1951/03/18  Situation: Patient enrolled in [facility] 30-day program. Visit completed with patient by telephone.   Background:   Initial Transition Care Management Follow-up Telephone Call Discharge Date and Diagnosis: 10/08/24, Status post carotid endarterectomy   Past Medical History:  Diagnosis Date   Allergy    Anxiety    Arthritis    Carotid artery occlusion    Chronic kidney disease    Diabetes mellitus without complication (HCC)    Fibromyalgia    GERD (gastroesophageal reflux disease)    Gout    Heart murmur    HTN (hypertension) 03/24/2019   Hyperlipemia 03/24/2019   Type 2 diabetes mellitus with complication, without long-term current use of insulin  (HCC) 03/24/2019    Assessment: Patient Reported Symptoms: Cognitive Cognitive Status: Alert and oriented to person, place, and time, Normal speech and language skills      Neurological Neurological Review of Symptoms: No symptoms reported    HEENT HEENT Symptoms Reported: No symptoms reported      Cardiovascular Cardiovascular Symptoms Reported: No symptoms reported Cardiovascular Management Strategies: Medication therapy, Routine screening Cardiovascular Comment: Recent right carotid Endarterectomy. Patient seen by surgeon.  Incision site healing.  Released to drive.  Respiratory Respiratory Symptoms Reported: No symptoms reported    Endocrine Endocrine Symptoms Reported: No symptoms reported Is patient diabetic?: Yes Is patient checking blood sugars at home?: No Endocrine Self-Management Outcome: 4 (good)  Gastrointestinal Gastrointestinal Symptoms Reported: No symptoms reported      Genitourinary Genitourinary Symptoms Reported: No symptoms reported    Integumentary Integumentary Symptoms Reported: Incision Additional Integumentary Details: Recent right endarterectomy. Right neck incision per patient.  Patient denies signs of infection Skin Management Strategies: Routine screening  Musculoskeletal Musculoskelatal Symptoms Reviewed: Weakness Additional Musculoskeletal Details: Home health therpay active Musculoskeletal Management Strategies: Exercise Musculoskeletal Self-Management Outcome: 4 (good)      Psychosocial Psychosocial Symptoms Reported: No symptoms reported         There were no vitals filed for this visit. Pain Scale: 0-10 Pain Score: 0-No pain  Medications Reviewed Today     Reviewed by Kathyrn Warmuth, RN (Case Manager) on 10/27/24 at 1327  Med List Status: <None>   Medication Order Taking? Sig Documenting Provider Last Dose Status Informant  acetaminophen  (TYLENOL ) 500 MG tablet [id number] Yes Take 1,000-1,500 mg by mouth every 8 (eight) hours as needed for moderate pain. [provider]  Active Self           Med Note HASSEL, [facility]   Fri May 08, 2023  1:58 PM) EMMI/TOC call unable to reconcile medications  allopurinol  (ZYLOPRIM ) 100 MG tablet [id number] Yes Take 100 mg by mouth daily. [provider]  Active Self  aspirin  EC 81 MG tablet [id number] Yes Take 1 tablet (81 mg total) by mouth daily at 6 (six) AM. Swallow whole. Baglia, Corrina, PA-C  Active   augmented betamethasone dipropionate (DIPROLENE-AF) 0.05 % cream [id number] Yes Apply 1 Application topically 2 (two) times daily as needed (irritation). [provider]  Active Self  azelastine  (ASTELIN ) 0.1 % nasal spray [id number] Yes Place 2 sprays into both nostrils 2 (two) times daily. Use in each nostril as directed Okey Burns, MD  Active Self  Carboxymethylcellulose Sodium (REFRESH TEARS OP) [id number] Yes Place 1 drop into both eyes daily as needed (dry eyes). [provider]  Active Self  clopidogrel  (PLAVIX ) 75 MG tablet [id number] Yes TAKE 1 TABLET (75 MG TOTAL) BY MOUTH DAILY. Ladona Heinz, MD  Active Self  empagliflozin  (JARDIANCE ) 25 MG TABS tablet [id number] Yes Take  1 tablet (25 mg total) by mouth daily before breakfast. Ladona Heinz, MD  Active Self  ezetimibe  (ZETIA ) 10 MG tablet [id number] Yes TAKE 1 TABLET EVERY EVENING Ganji, Jay, MD  Active Self  famotidine  (PEPCID ) 20 MG tablet [id number] Yes Take 1 tablet (20 mg total) by mouth 2 (two) times daily. Soldatova, Liuba, MD  Active Self  furosemide (LASIX) 40 MG tablet [id number] Yes Take 40 mg by mouth daily as needed for edema. [provider]  Active Self  ipratropium (ATROVENT ) 0.03 % nasal spray [id number] Yes Place 2 sprays into both nostrils every 12 (twelve) hours. Soldatova, Liuba, MD  Active Self  labetalol  (NORMODYNE ) 300 MG tablet [id number] Yes Take 300 mg by mouth 2 (two) times daily. [provider]  Active Self  levocetirizine (XYZAL ) 5 MG tablet [id number] Yes TAKE 1 TABLET EVERY EVENING Soldatova, Liuba, MD  Active Self  linaclotide  (LINZESS ) 145 MCG CAPS capsule [id number] Yes Take 145 mcg by mouth daily as needed (constipation). [provider]  Active Self  losartan-hydrochlorothiazide (HYZAAR) 100-25 MG tablet [id number] Yes Take 1 tablet by mouth daily. [provider]  Active Self  oxyCODONE  (OXY IR/ROXICODONE ) 5 MG immediate release tablet [id number] Yes Take 1 tablet (5 mg total) by mouth every 6 (six) hours as needed for severe pain (pain score 7-10). Baglia, Corrina, PA-C  Active   PARoxetine  (PAXIL ) 20 MG tablet [id number] Yes Take 20 mg by mouth daily. [provider]  Active Self  rosuvastatin  (CRESTOR ) 40 MG tablet [id number] Yes Take 1 tablet (40 mg total) by mouth daily. Ladona Heinz, MD  Active Self  Semaglutide, 2 MG/DOSE, (OZEMPIC, 2 MG/DOSE,) 8 MG/3ML SOPN [id number] Yes Inject 2 mg into the skin once a week. [provider]  Active Self  TRESIBA FLEXTOUCH 200 UNIT/ML FlexTouch Pen [id number] Yes Inject 25 Units into the skin daily. [provider]  Active Self  Vitamin D, Ergocalciferol, (DRISDOL) 1.25 MG (50000 UNIT) CAPS  capsule [id number] Yes Take 50,000 Units by mouth every Wednesday. [provider]  Active Self            Goals Addressed             This Visit's Progress    [facility] Transitions of Care (TOC) Care Plan       Problems:  Recent Hospitalization for treatment of Right Carotid Endarterectomy Knowledge Deficit Related to Right Carotid Endarterectomy  Goal:  Over the next 30 days, the patient will not experience hospital readmission  Interventions:  Transitions of Care: Doctor Visits  - discussed the importance of doctor visits Post discharge activity limitations prescribed by provider reviewed Reviewed Signs and symptoms of infection Discussed upcoming PCP appointment 11/03/24  Patient Self Care Activities:  Attend all scheduled provider appointments Call pharmacy for medication refills 3-7 days in advance of running out of medications Call provider office for new concerns or questions  Notify RN Care Manager of TOC call rescheduling needs Participate in Transition of Care Program/Attend TOC scheduled calls Take medications as prescribed   Monitor for swelling, redness, pain, pus, and fever Keep Right Neck  Incision clean and dry.   Notify physician immediately for any changes   Plan:  The patient has been provided with contact information for the care management team and has been advised to  call with any health related questions or concerns.         Recommendation:   Continue Current Plan of Care  Follow Up Plan:   Telephone follow-up in 1 week  Temari Schooler J. Chamar Broughton RN, MSN [facility], [facility] RN Care Manager Direct Dial: [phone]  Fax: [phone] Website: [email or web address]      "

## 2024-10-27 NOTE — Patient Instructions (Signed)
 Visit Information  Thank you for taking time to visit with me today. Please don't hesitate to contact me if I can be of assistance to you before our next scheduled telephone appointment.  Our next appointment is by telephone on 11/04/24 at 200 pm  Following is a copy of your care plan:   Goals Addressed             This Visit's Progress    [facility] Transitions of Care (TOC) Care Plan       Problems:  Recent Hospitalization for treatment of Right Carotid Endarterectomy Knowledge Deficit Related to Right Carotid Endarterectomy  Goal:  Over the next 30 days, the patient will not experience hospital readmission  Interventions:  Transitions of Care: Doctor Visits  - discussed the importance of doctor visits Post discharge activity limitations prescribed by provider reviewed Reviewed Signs and symptoms of infection Discussed upcoming PCP appointment 11/03/24  Patient Self Care Activities:  Attend all scheduled provider appointments Call pharmacy for medication refills 3-7 days in advance of running out of medications Call provider office for new concerns or questions  Notify RN Care Manager of TOC call rescheduling needs Participate in Transition of Care Program/Attend TOC scheduled calls Take medications as prescribed   Monitor for swelling, redness, pain, pus, and fever Keep Right Neck  Incision clean and dry.   Notify physician immediately for any changes   Plan:  The patient has been provided with contact information for the care management team and has been advised to call with any health related questions or concerns.         Patient verbalizes understanding of instructions and care plan provided today and agrees to view in MyChart. Active MyChart status and patient understanding of how to access instructions and care plan via MyChart confirmed with patient.     The patient has been provided with contact information for the care management team and has been advised to call  with any health related questions or concerns.   Please call the care guide team at [phone] if you need to cancel or reschedule your appointment.   Please call the Suicide and Crisis Lifeline: 988 if you are experiencing a Mental Health or Behavioral Health Crisis or need someone to talk to.  Shirline Kendle J. Lai Hendriks RN, MSN [facility], [facility] RN Care Manager Direct Dial: [phone]  Fax: [phone] Website: [email or web address]

## 2024-10-31 ENCOUNTER — Encounter: Payer: Self-pay | Admitting: Vascular Surgery

## 2024-11-02 ENCOUNTER — Encounter: Payer: Self-pay | Admitting: Vascular Surgery

## 2024-11-04 ENCOUNTER — Other Ambulatory Visit: Payer: Self-pay

## 2024-11-04 NOTE — Patient Instructions (Signed)
 Visit Information  Thank you for taking time to visit with me today. Please don't hesitate to contact me if I can be of assistance to you before our next scheduled telephone appointment.  Our next appointment is by telephone on 11/12/23 at 200 pm  Following is a copy of your care plan:   Goals Addressed             This Visit's Progress    [facility] Transitions of Care (TOC) Care Plan       Problems:  Recent Hospitalization for treatment of Right Carotid Endarterectomy Knowledge Deficit Related to Right Carotid Endarterectomy  Goal:  Over the next 30 days, the patient will not experience hospital readmission  Interventions:  Transitions of Care: Doctor Visits  - discussed the importance of doctor visits Post discharge activity limitations prescribed by provider reviewed Reviewed Signs and symptoms of infection  Patient Self Care Activities:  Attend all scheduled provider appointments Call pharmacy for medication refills 3-7 days in advance of running out of medications Call provider office for new concerns or questions  Notify RN Care Manager of TOC call rescheduling needs Participate in Transition of Care Program/Attend TOC scheduled calls Take medications as prescribed   Monitor for swelling, redness, pain, pus, and fever Keep Right Neck  Incision clean and dry.   Notify physician immediately for any changes   Plan:  The patient has been provided with contact information for the care management team and has been advised to call with any health related questions or concerns.         Patient verbalizes understanding of instructions and care plan provided today and agrees to view in MyChart. Active MyChart status and patient understanding of how to access instructions and care plan via MyChart confirmed with patient.     The patient has been provided with contact information for the care management team and has been advised to call with any health related questions or  concerns.   Please call the care guide team at [phone] if you need to cancel or reschedule your appointment.   Please call the Suicide and Crisis Lifeline: 988 call the [place]  National Suicide Prevention Lifeline: 774-519-0310 or TTY: (626)190-8725 TTY 5711254789) to talk to a trained counselor if you are experiencing a Mental Health or Behavioral Health Crisis or need someone to talk to.  Mariona Scholes J. Khris Jansson RN, MSN [facility], [facility] RN Care Manager Direct Dial: [phone]  Fax: [phone] Website: [email or web address]

## 2024-11-04 NOTE — Transitions of Care (Post Inpatient/ED Visit) (Signed)
 " Transition of Care week 4  Visit Note  11/04/2024  Name: Brandi Peters MRN: [id number]          DOB: [birth year]  Situation: Patient enrolled in [facility] 30-day program. Visit completed with patient by telephone.   Background:   Initial Transition Care Management Follow-up Telephone Call Discharge Date and Diagnosis: 10/08/24, Status post carotid endarterectomy   Past Medical History:  Diagnosis Date   Allergy    Anxiety    Arthritis    Carotid artery occlusion    Chronic kidney disease    Diabetes mellitus without complication (HCC)    Fibromyalgia    GERD (gastroesophageal reflux disease)    Gout    Heart murmur    HTN (hypertension) 03/24/2019   Hyperlipemia 03/24/2019   Type 2 diabetes mellitus with complication, without long-term current use of insulin  (HCC) 03/24/2019    Assessment: Patient Reported Symptoms: Cognitive Cognitive Status: Alert and oriented to person, place, and time, Normal speech and language skills      Neurological Neurological Review of Symptoms: No symptoms reported    HEENT HEENT Symptoms Reported: No symptoms reported      Cardiovascular Cardiovascular Symptoms Reported: No symptoms reported Cardiovascular Management Strategies: Medication therapy, Routine screening Cardiovascular Comment: Recent right carotid Endarterectomy. Patient seen by PCP this week.  PCP pleased with progress.  Respiratory Respiratory Symptoms Reported: No symptoms reported    Endocrine Endocrine Symptoms Reported: No symptoms reported Is patient diabetic?: Yes Is patient checking blood sugars at home?: No    Gastrointestinal Gastrointestinal Symptoms Reported: No symptoms reported      Genitourinary Genitourinary Symptoms Reported: No symptoms reported    Integumentary Integumentary Symptoms Reported: Incision Additional Integumentary Details: Recent right endarterectomy. Right neck incision per patient. Patient reports area healing with no signs of  infection Skin Management Strategies: Routine screening Skin Self-Management Outcome: 4 (good)  Musculoskeletal Musculoskelatal Symptoms Reviewed: Weakness Additional Musculoskeletal Details: Generalized weakness.  Home Health PT active. Musculoskeletal Management Strategies: Exercise Musculoskeletal Self-Management Outcome: 4 (good)      Psychosocial Psychosocial Symptoms Reported: No symptoms reported         There were no vitals filed for this visit. Pain Scale: 0-10 Pain Score: 0-No pain  Medications Reviewed Today     Reviewed by Virna Livengood, RN (Case Manager) on 11/04/24 at 1405  Med List Status: <None>   Medication Order Taking? Sig Documenting Provider Last Dose Status Informant  acetaminophen  (TYLENOL ) 500 MG tablet [id number] Yes Take 1,000-1,500 mg by mouth every 8 (eight) hours as needed for moderate pain. [provider]  Active Self           Med Note HASSEL, [facility]   Fri May 08, 2023  1:58 PM) EMMI/TOC call unable to reconcile medications  allopurinol  (ZYLOPRIM ) 100 MG tablet [id number] Yes Take 100 mg by mouth daily. [provider]  Active Self  aspirin  EC 81 MG tablet [id number] Yes Take 1 tablet (81 mg total) by mouth daily at 6 (six) AM. Swallow whole. Baglia, Corrina, PA-C  Active   augmented betamethasone dipropionate (DIPROLENE-AF) 0.05 % cream [id number] Yes Apply 1 Application topically 2 (two) times daily as needed (irritation). [provider]  Active Self  azelastine  (ASTELIN ) 0.1 % nasal spray [id number] Yes Place 2 sprays into both nostrils 2 (two) times daily. Use in each nostril as directed Okey Burns, MD  Active Self  Carboxymethylcellulose Sodium (REFRESH TEARS OP) [id number] Yes Place 1 drop into both eyes daily as needed (dry  eyes). [provider]  Active Self  clopidogrel  (PLAVIX ) 75 MG tablet [id number] Yes TAKE 1 TABLET (75 MG TOTAL) BY MOUTH DAILY. Ladona Heinz, MD  Active Self  empagliflozin  (JARDIANCE ) 25  MG TABS tablet [id number] Yes Take 1 tablet (25 mg total) by mouth daily before breakfast. Ladona Heinz, MD  Active Self  ezetimibe  (ZETIA ) 10 MG tablet [id number] Yes TAKE 1 TABLET EVERY EVENING Ganji, Jay, MD  Active Self  famotidine  (PEPCID ) 20 MG tablet [id number] Yes Take 1 tablet (20 mg total) by mouth 2 (two) times daily. Soldatova, Liuba, MD  Active Self  furosemide (LASIX) 40 MG tablet [id number] Yes Take 40 mg by mouth daily as needed for edema. [provider]  Active Self  ipratropium (ATROVENT ) 0.03 % nasal spray [id number] Yes Place 2 sprays into both nostrils every 12 (twelve) hours. Soldatova, Liuba, MD  Active Self  labetalol  (NORMODYNE ) 300 MG tablet [id number] Yes Take 300 mg by mouth 2 (two) times daily. [provider]  Active Self  levocetirizine (XYZAL ) 5 MG tablet [id number] Yes TAKE 1 TABLET EVERY EVENING Soldatova, Liuba, MD  Active Self  linaclotide  (LINZESS ) 145 MCG CAPS capsule [id number] Yes Take 145 mcg by mouth daily as needed (constipation). [provider]  Active Self  losartan-hydrochlorothiazide (HYZAAR) 100-25 MG tablet [id number] Yes Take 1 tablet by mouth daily. [provider]  Active Self  oxyCODONE  (OXY IR/ROXICODONE ) 5 MG immediate release tablet [id number] Yes Take 1 tablet (5 mg total) by mouth every 6 (six) hours as needed for severe pain (pain score 7-10). Baglia, Corrina, PA-C  Active   PARoxetine  (PAXIL ) 20 MG tablet [id number] Yes Take 20 mg by mouth daily. [provider]  Active Self  rosuvastatin  (CRESTOR ) 40 MG tablet [id number] Yes Take 1 tablet (40 mg total) by mouth daily. Ladona Heinz, MD  Active Self  Semaglutide, 2 MG/DOSE, (OZEMPIC, 2 MG/DOSE,) 8 MG/3ML SOPN [id number] Yes Inject 2 mg into the skin once a week. [provider]  Active Self  TRESIBA FLEXTOUCH 200 UNIT/ML FlexTouch Pen [id number] Yes Inject 25 Units into the skin daily. [provider]  Active Self  Vitamin D, Ergocalciferol,  (DRISDOL) 1.25 MG (50000 UNIT) CAPS capsule [id number] Yes Take 50,000 Units by mouth every Wednesday. [provider]  Active Self            Goals Addressed             This Visit's Progress    [facility] Transitions of Care (TOC) Care Plan       Problems:  Recent Hospitalization for treatment of Right Carotid Endarterectomy Knowledge Deficit Related to Right Carotid Endarterectomy  Goal:  Over the next 30 days, the patient will not experience hospital readmission  Interventions:  Transitions of Care: Doctor Visits  - discussed the importance of doctor visits Post discharge activity limitations prescribed by provider reviewed Reviewed Signs and symptoms of infection  Patient Self Care Activities:  Attend all scheduled provider appointments Call pharmacy for medication refills 3-7 days in advance of running out of medications Call provider office for new concerns or questions  Notify RN Care Manager of TOC call rescheduling needs Participate in Transition of Care Program/Attend TOC scheduled calls Take medications as prescribed   Monitor for swelling, redness, pain, pus, and fever Keep Right Neck  Incision clean and dry.   Notify physician immediately for any changes   Plan:  The patient has been provided with contact information for the care management team and has  been advised to call with any health related questions or concerns.         Recommendation:   Continue Current Plan of Care  Follow Up Plan:   Telephone follow-up in 1 week  Jovany Disano J. Jerri Glauser RN, MSN [facility], [facility] RN Care Manager Direct Dial: [phone]  Fax: [phone] Website: [email or web address]      "

## 2024-11-09 ENCOUNTER — Encounter: Payer: Self-pay | Admitting: Vascular Surgery

## 2024-11-11 ENCOUNTER — Other Ambulatory Visit: Payer: Self-pay

## 2024-11-11 DIAGNOSIS — E118 Type 2 diabetes mellitus with unspecified complications: Secondary | ICD-10-CM

## 2024-11-11 DIAGNOSIS — I10 Essential (primary) hypertension: Secondary | ICD-10-CM

## 2024-11-11 NOTE — Transitions of Care (Post Inpatient/ED Visit) (Signed)
 " Transition of Care Week 5  Visit Note  11/11/2024  Name: Brandi Peters MRN: [id number]          DOB: [birth year]  Situation: Patient enrolled in [facility] 30-day program. Visit completed with patient by telephone.   Background:   Initial Transition Care Management Follow-up Telephone Call Discharge Date and Diagnosis: No data recorded   Past Medical History:  Diagnosis Date   Allergy    Anxiety    Arthritis    Carotid artery occlusion    Chronic kidney disease    Diabetes mellitus without complication (HCC)    Fibromyalgia    GERD (gastroesophageal reflux disease)    Gout    Heart murmur    HTN (hypertension) 03/24/2019   Hyperlipemia 03/24/2019   Type 2 diabetes mellitus with complication, without long-term current use of insulin  (HCC) 03/24/2019    Assessment: Patient Reported Symptoms: Cognitive Cognitive Status: Alert and oriented to person, place, and time, Normal speech and language skills      Neurological Neurological Review of Symptoms: No symptoms reported    HEENT HEENT Symptoms Reported: No symptoms reported      Cardiovascular Cardiovascular Symptoms Reported: No symptoms reported Cardiovascular Comment: Recent right carotid Endarterectomy.  Patient reports healing continues  Respiratory Respiratory Symptoms Reported: No symptoms reported    Endocrine Endocrine Symptoms Reported: No symptoms reported    Gastrointestinal Gastrointestinal Symptoms Reported: No symptoms reported      Genitourinary Genitourinary Symptoms Reported: No symptoms reported    Integumentary Integumentary Symptoms Reported: Incision Additional Integumentary Details: Recent right endarterectomy. Right neck incision per patient. Patient reports area healing with no signs of infection Skin Management Strategies: Routine screening  Musculoskeletal Musculoskelatal Symptoms Reviewed: Weakness Additional Musculoskeletal Details: General Weakness. PT home health active Musculoskeletal  Management Strategies: Exercise, Routine screening Falls in the past year?: No    Psychosocial Psychosocial Symptoms Reported: No symptoms reported         There were no vitals filed for this visit. Pain Scale: 0-10 Pain Score: 0-No pain  Medications Reviewed Today     Reviewed by Elvy Mclarty, RN (Case Manager) on 11/11/24 at 1353  Med List Status: <None>   Medication Order Taking? Sig Documenting Provider Last Dose Status Informant  acetaminophen  (TYLENOL ) 500 MG tablet [id number] Yes Take 1,000-1,500 mg by mouth every 8 (eight) hours as needed for moderate pain. [provider]  Active Self           Med Note HASSEL, [facility]   Fri May 08, 2023  1:58 PM) EMMI/TOC call unable to reconcile medications  allopurinol  (ZYLOPRIM ) 100 MG tablet [id number] Yes Take 100 mg by mouth daily. [provider]  Active Self  aspirin  EC 81 MG tablet [id number] Yes Take 1 tablet (81 mg total) by mouth daily at 6 (six) AM. Swallow whole. Baglia, Corrina, PA-C  Active   augmented betamethasone dipropionate (DIPROLENE-AF) 0.05 % cream [id number] Yes Apply 1 Application topically 2 (two) times daily as needed (irritation). [provider]  Active Self  azelastine  (ASTELIN ) 0.1 % nasal spray [id number] Yes Place 2 sprays into both nostrils 2 (two) times daily. Use in each nostril as directed Okey Burns, MD  Active Self  Carboxymethylcellulose Sodium (REFRESH TEARS OP) [id number] Yes Place 1 drop into both eyes daily as needed (dry eyes). [provider]  Active Self  clopidogrel  (PLAVIX ) 75 MG tablet [id number] Yes TAKE 1 TABLET (75 MG TOTAL) BY MOUTH DAILY. Ladona Heinz, MD  Active Self  empagliflozin  (  JARDIANCE ) 25 MG TABS tablet [id number]  Take 1 tablet (25 mg total) by mouth daily before breakfast.  Patient not taking: Reported on 11/11/2024   Ladona Heinz, MD  Active Self  ezetimibe  (ZETIA ) 10 MG tablet [id number] Yes TAKE 1 TABLET EVERY EVENING Ganji, Jay, MD  Active Self   famotidine  (PEPCID ) 20 MG tablet [id number] Yes Take 1 tablet (20 mg total) by mouth 2 (two) times daily. Soldatova, Liuba, MD  Active Self  furosemide (LASIX) 40 MG tablet [id number] Yes Take 40 mg by mouth daily as needed for edema. [provider]  Active Self  ipratropium (ATROVENT ) 0.03 % nasal spray [id number] Yes Place 2 sprays into both nostrils every 12 (twelve) hours. Soldatova, Liuba, MD  Active Self  labetalol  (NORMODYNE ) 300 MG tablet [id number] Yes Take 300 mg by mouth 2 (two) times daily. [provider]  Active Self  levocetirizine (XYZAL ) 5 MG tablet [id number] Yes TAKE 1 TABLET EVERY EVENING Soldatova, Liuba, MD  Active Self  linaclotide  (LINZESS ) 145 MCG CAPS capsule [id number] Yes Take 145 mcg by mouth daily as needed (constipation). [provider]  Active Self  losartan-hydrochlorothiazide (HYZAAR) 100-25 MG tablet [id number] Yes Take 1 tablet by mouth daily. [provider]  Active Self  oxyCODONE  (OXY IR/ROXICODONE ) 5 MG immediate release tablet [id number] Yes Take 1 tablet (5 mg total) by mouth every 6 (six) hours as needed for severe pain (pain score 7-10). Baglia, Corrina, PA-C  Active   PARoxetine  (PAXIL ) 20 MG tablet [id number] Yes Take 20 mg by mouth daily. [provider]  Active Self  rosuvastatin  (CRESTOR ) 40 MG tablet [id number] Yes Take 1 tablet (40 mg total) by mouth daily. Ladona Heinz, MD  Active Self  Semaglutide, 2 MG/DOSE, (OZEMPIC, 2 MG/DOSE,) 8 MG/3ML SOPN [id number] Yes Inject 2 mg into the skin once a week. [provider]  Active Self  TRESIBA FLEXTOUCH 200 UNIT/ML FlexTouch Pen [id number] Yes Inject 25 Units into the skin daily. [provider]  Active Self  Vitamin D, Ergocalciferol, (DRISDOL) 1.25 MG (50000 UNIT) CAPS capsule [id number] Yes Take 50,000 Units by mouth every Wednesday. [provider]  Active Self            Goals Addressed             This Visit's Progress    COMPLETED:  [facility]I Transitions of Care (TOC) Care Plan       Problems:  Recent Hospitalization for treatment of Right Carotid Endarterectomy Knowledge Deficit Related to Right Carotid Endarterectomy  Goal:  Over the next 30 days, the patient will not experience hospital readmission  Interventions:  Transitions of Care: Doctor Visits  - discussed the importance of doctor visits Post discharge activity limitations prescribed by provider reviewed Reviewed Signs and symptoms of infection  Patient Self Care Activities:  Attend all scheduled provider appointments Call pharmacy for medication refills 3-7 days in advance of running out of medications Call provider office for new concerns or questions  Notify RN Care Manager of TOC call rescheduling needs Participate in Transition of Care Program/Attend TOC scheduled calls Take medications as prescribed   Monitor for swelling, redness, pain, pus, and fever Keep Right Neck  Incision clean and dry.   Notify physician immediately for any changes   Plan:  The patient has been provided with contact information for the care management team and has been advised to call with any health related questions or concerns.         Recommendation:  Referral to: CCM program Continue Current Plan of Care  Follow Up Plan:   Referral to RN Case Manager Closing From:  Transitions of Care Program  Merrick Feutz DOROTHA Seeds RN, MSN [place]  [facility], [facility] RN Care Manager Direct Dial: [phone]  Fax: [phone] Website: [email or web address]      "

## 2024-11-11 NOTE — Patient Instructions (Signed)
 Visit Information  Thank you for taking time to visit with me today. Please don't hesitate to contact me if I can be of assistance to you. You have been referred to our CCM program.  Please be on the lookout for a call to schedule first call with CCM Nurse.   Following is a copy of your care plan:   Goals Addressed             This Visit's Progress    COMPLETED: VBCI Transitions of Care (TOC) Care Plan       Problems:  Recent Hospitalization for treatment of Right Carotid Endarterectomy Knowledge Deficit Related to Right Carotid Endarterectomy  Goal:  Over the next 30 days, the patient will not experience hospital readmission  Interventions:  Transitions of Care: Doctor Visits  - discussed the importance of doctor visits Post discharge activity limitations prescribed by provider reviewed Reviewed Signs and symptoms of infection  Patient Self Care Activities:  Attend all scheduled provider appointments Call pharmacy for medication refills 3-7 days in advance of running out of medications Call provider office for new concerns or questions  Notify RN Care Manager of TOC call rescheduling needs Participate in Transition of Care Program/Attend TOC scheduled calls Take medications as prescribed   Monitor for swelling, redness, pain, pus, and fever Keep Right Neck  Incision clean and dry.   Notify physician immediately for any changes   Plan:  The patient has been provided with contact information for the care management team and has been advised to call with any health related questions or concerns.         Patient verbalizes understanding of instructions and care plan provided today and agrees to view in MyChart. Active MyChart status and patient understanding of how to access instructions and care plan via MyChart confirmed with patient.     The patient has been provided with contact information for the care management team and has been advised to call with any health  related questions or concerns.   Please call the care guide team at [phone] if you need to cancel or reschedule your appointment.   Please call the Suicide and Crisis Lifeline: 988 if you are experiencing a Mental Health or Behavioral Health Crisis or need someone to talk to.  Acadia Thammavong J. Aubrianna Orchard RN, MSN [facility], [facility] RN Care Manager Direct Dial: [phone]  Fax: [phone] Website: [email or web address]

## 2024-11-17 ENCOUNTER — Telehealth: Payer: Self-pay

## 2024-11-17 NOTE — Progress Notes (Signed)
 Complex Care Management Note  Care Guide Note 11/17/2024 Name: Brandi Peters MRN: [id number] DOB: 03-31-1951  Brandi Peters is a 74 y.o. year old female who sees Gerome Brunet, [place] for primary care. I reached out to Brandi Peters by phone today to offer complex care management services.  Brandi Peters was given information about Complex Care Management services today including:   The Complex Care Management services include support from the care team which includes your Nurse Care Manager, Clinical Social Worker, or Pharmacist.  The Complex Care Management team is here to help remove barriers to the health concerns and goals most important to you. Complex Care Management services are voluntary, and the patient may decline or stop services at any time by request to their care team member.   Complex Care Management Consent Status: Patient agreed to services and verbal consent obtained.   Follow up plan:  Telephone appointment with complex care management team member scheduled for:  11/23/24  Encounter Outcome:  Patient Scheduled  Debbe Fuse [facility], [facility] Guide  Direct Dial: [phone]  Fax [phone]

## 2024-11-23 ENCOUNTER — Other Ambulatory Visit: Payer: Self-pay

## 2024-11-23 NOTE — Patient Instructions (Signed)
 Visit Information  Thank you for taking time to visit with me today. Please don't hesitate to contact me if I can be of assistance to you before our next scheduled appointment.  Our next appointment is by telephone on 12/02/24 at 2:45 pm Please call the care guide team at [phone] if you need to cancel or reschedule your appointment.   Following is a copy of your care plan:   Goals Addressed             This Visit's Progress    VBCI RN Care Plan-DMII       Problems:  Chronic Disease Management support and education needs related to DMII Knowledge Deficits related to DMII  Goal: Over the next 90 days the Patient will attend all scheduled medical appointments: with providers and specialists pertaining to health maintenance as evidenced by patient report and/or hart review.         continue to work with Medical Illustrator and/or Social Worker to address care management and care coordination needs related to DMII as evidenced by adherence to care management team scheduled appointments     demonstrate Ongoing adherence to prescribed treatment plan for DMII as evidenced by medication compliance, following a low carb diet.  demonstrate understanding of rationale for each prescribed medication as evidenced by patient being able to identify what medications she is taking for her diabetes and why compliance is so important in management.     take all medications exactly as prescribed and will call provider for medication related questions as evidenced by patient report.     verbalize basic understanding of DMII disease process and self health management plan as evidenced by following treatment plan and seeing a reduction in A1C.   Interventions:   Diabetes Interventions: Assessed patient's understanding of A1c goal: 6.0. Provided education to patient about basic DM disease process Reviewed medications with patient and discussed importance of medication adherence Counseled on importance of  regular laboratory monitoring as prescribed Provided patient with written educational materials related to hypo and hyperglycemia and importance of correct treatment Screening for signs and symptoms of depression related to chronic disease state  Assessed social determinant of health barriers Lab Results  Component Value Date   HGBA1C 6.4 (H) 10/04/2024    Patient Self-Care Activities:  Attend all scheduled provider appointments Call pharmacy for medication refills 3-7 days in advance of running out of medications Call provider office for new concerns or questions  Perform all self care activities independently  Perform IADL's (shopping, preparing meals, housekeeping, managing finances) independently Take medications as prescribed   keep appointment with eye doctor check feet daily for cuts, sores or redness trim toenails straight across drink 6 to 8 glasses of water each day eat fish at least once per week fill half of plate with vegetables keep a food diary limit fast food meals to no more than 1 per week manage portion size prepare main meal at home 3 to 5 days each week set a realistic goal switch to low-fat or skim milk switch to sugar-free drinks keep feet up while sitting wash and dry feet carefully every day wear comfortable, cotton socks wear comfortable, well-fitting shoes  Plan:  Telephone follow up appointment with care management team member scheduled for:  12/02/24 at 2:45 pm          VBCI RN Care Plan-HTN       Problems:  Chronic Disease Management support and education needs related to HTN Knowledge Deficits related to  HTN  Goal: Over the next 90 days the Patient will attend all scheduled medical appointments: with providers and specialists pertaining to health maintenance as evidenced by patient report and/or chart review.         continue to work with Medical Illustrator and/or Social Worker to address care management and care coordination needs related to  HTN as evidenced by adherence to care management team scheduled appointments     demonstrate Ongoing adherence to prescribed treatment plan for HTN as evidenced by following a low sodium diet, checking and recording BP daily.  demonstrate understanding of rationale for each prescribed medication as evidenced by patient understanding which medications are for BP management and why compliance is so important.     take all medications exactly as prescribed and will call provider for medication related questions as evidenced by patient report and/or chart review.     verbalize basic understanding of HTN disease process and self health management plan as evidenced by following treatment plan including low sodium diet, monitoring for edema and taking Furosemide as needed, checking and recording BP daily and notifying provider for any changes or concerns.   Interventions:   Hypertension Interventions: Last practice recorded BP readings:  BP Readings from Last 3 Encounters:  11/23/24 (!) 146/80  10/26/24 127/71  10/08/24 (!) 110/42   Most recent eGFR/CrCl: No results found for: EGFR  No components found for: CRCL  Evaluation of current treatment plan related to hypertension self management and patient's adherence to plan as established by provider Provided education to patient re: stroke prevention, s/s of heart attack and stroke Reviewed medications with patient and discussed importance of compliance Counseled on the importance of exercise goals with target of 150 minutes per week Discussed plans with patient for ongoing care management follow up and provided patient with direct contact information for care management team Advised patient, providing education and rationale, to monitor blood pressure daily and record, calling PCP for findings outside established parameters Provided education on prescribed diet low sodium/low carb Discussed complications of poorly controlled blood pressure such as  heart disease, stroke, circulatory complications, vision complications, kidney impairment, sexual dysfunction Screening for signs and symptoms of depression related to chronic disease state  Assessed social determinant of health barriers  Patient Self-Care Activities:  Attend all scheduled provider appointments Call pharmacy for medication refills 3-7 days in advance of running out of medications Call provider office for new concerns or questions  Perform all self care activities independently  Perform IADL's (shopping, preparing meals, housekeeping, managing finances) independently Take medications as prescribed   check blood pressure daily write blood pressure results in a log or diary learn about high blood pressure keep a blood pressure log take blood pressure log to all doctor appointments call doctor for signs and symptoms of high blood pressure keep all doctor appointments take medications for blood pressure exactly as prescribed report new symptoms to your doctor eat more whole grains, fruits and vegetables, lean meats and healthy fats limit salt intake to 1,500 mg/day  Plan:  Telephone follow up appointment with care management team member scheduled for:  12/02/24 at 2:45 pm.             Please call the Suicide and Crisis Lifeline: 988 call the [place]  National Suicide Prevention Lifeline: 475-294-2948 or TTY: 281-269-3153 TTY 951-584-3580) to talk to a trained counselor call 1-800-273-TALK (toll free, 24 hour hotline) if you are experiencing a Mental Health or Behavioral Health Crisis or need someone to  talk to.  Patient verbalized understanding of Care plan and visit instructions communicated this visit  Warren Quivers RN CM Population Health-Complex Care Management [facility] [phone]

## 2024-11-23 NOTE — Patient Outreach (Signed)
 Complex Care Management   Visit Note  11/23/2024  Name:  Brandi Peters MRN: [id number] DOB: 1951-01-20  Situation: Referral received for Complex Care Management related to HTN and DMII. I obtained verbal consent from Patient.  Visit completed with Patient  on the phone  Background:   Past Medical History:  Diagnosis Date   Allergy    Anxiety    Arthritis    Carotid artery occlusion    Chronic kidney disease    Diabetes mellitus without complication (HCC)    Fibromyalgia    GERD (gastroesophageal reflux disease)    Gout    Heart murmur    HTN (hypertension) 03/24/2019   Hyperlipemia 03/24/2019   Type 2 diabetes mellitus with complication, without long-term current use of insulin  (HCC) 03/24/2019    Assessment: Patient reports checking BP 1-2 times daily. She states she does not check her BS and that she has her A1C done every three months.  Patient Reported Symptoms:  Cognitive Cognitive Status: No symptoms reported, Normal speech and language skills, Alert and oriented to person, place, and time Cognitive/Intellectual Conditions Management [RPT]: None reported or documented in medical history or problem list   Health Maintenance Behaviors: Annual physical exam, Sleep adequate, Healthy diet, Stress management Healing Pattern: Average Health Facilitated by: Healthy diet, Prayer/meditation, Rest, Stress management  Neurological Neurological Review of Symptoms: No symptoms reported Neurological Management Strategies: Adequate rest, Routine screening Neurological Self-Management Outcome: 4 (good)  HEENT HEENT Symptoms Reported: Eye dryness HEENT Management Strategies: Adequate rest, Routine screening, Medication therapy HEENT Self-Management Outcome: 4 (good) HEENT Comment: Uses artificial tears- helpful    Cardiovascular Cardiovascular Symptoms Reported: Swelling in legs or feet (reports minimal swelling in bilateral feet- elevating and taking Furosemide as needed.) Does  patient have uncontrolled Hypertension?: Yes Is patient checking Blood Pressure at home?: Yes Patient's Recent BP reading at home: BP reported today 146/80 Cardiovascular Management Strategies: Routine screening, Medication therapy Cardiovascular Self-Management Outcome: 4 (good) Cardiovascular Comment: Recent right carotid endarterectomy in December of 2025- healed  Respiratory Respiratory Symptoms Reported: No symptoms reported Other Respiratory Symptoms: Reports occasional SOB with exertion. Respiratory Management Strategies: Adequate rest, Routine screening Respiratory Self-Management Outcome: 4 (good)  Endocrine Endocrine Symptoms Reported: No symptoms reported Is patient diabetic?: Yes Is patient checking blood sugars at home?: No Endocrine Self-Management Outcome: 4 (good) Endocrine Comment: patient reports not checking BS but has A1C every 3 months.  Gastrointestinal Gastrointestinal Symptoms Reported: No symptoms reported Additional Gastrointestinal Details: Normal BM's every other day Gastrointestinal Management Strategies: Adequate rest Gastrointestinal Self-Management Outcome: 4 (good)    Genitourinary Genitourinary Symptoms Reported: No symptoms reported Genitourinary Management Strategies: Adequate rest Genitourinary Self-Management Outcome: 4 (good)  Integumentary Integumentary Symptoms Reported: No symptoms reported Additional Integumentary Details: right neck incision has healed- no concerns. Skin Management Strategies: Routine screening Skin Self-Management Outcome: 4 (good)  Musculoskeletal Musculoskelatal Symptoms Reviewed: Limited mobility, Unsteady gait Additional Musculoskeletal Details: Uses a cane when ambulating outside of the home. Active with home health PT currently. Musculoskeletal Management Strategies: Adequate rest, Routine screening Musculoskeletal Self-Management Outcome: 3 (uncertain) Falls in the past year?: No Number of falls in past year: 1 or  less Was there an injury with Fall?: No Fall Risk Category Calculator: 0 Patient Fall Risk Level: Low Fall Risk Patient at Risk for Falls Due to: No Fall Risks Fall risk Follow up: Falls prevention discussed  Psychosocial Psychosocial Symptoms Reported: No symptoms reported Behavioral Management Strategies: Support system, Adequate rest, Support group, Counseling Behavioral Health Comment: Grief  services at church services- loss of husband 2 years Major Change/Loss/Stressor/Fears (CP): Death of a loved one Techniques to [place] with Loss/Stress/Change: Support group Quality of Family Relationships: helpful, involved, supportive Do you feel physically threatened by others?: No    11/23/2024    PHQ2-9 Depression Screening   Little interest or pleasure in doing things Not at all  Feeling down, depressed, or hopeless Several days  PHQ-2 - Total Score 1  Trouble falling or staying asleep, or sleeping too much    Feeling tired or having little energy    Poor appetite or overeating     Feeling bad about yourself - or that you are a failure or have let yourself or your family down    Trouble concentrating on things, such as reading the newspaper or watching television    Moving or speaking so slowly that other people could have noticed.  Or the opposite - being so fidgety or restless that you have been moving around a lot more than usual    Thoughts that you would be better off dead, or hurting yourself in some way    PHQ2-9 Total Score    If you checked off any problems, how difficult have these problems made it for you to do your work, take care of things at home, or get along with other people    Depression Interventions/Treatment      Today's Vitals   11/23/24 1428  BP: (!) 146/80   Pain Scale: 0-10 Pain Score: 0-No pain  Medications Reviewed Today     Reviewed by Leodis Warren DEL, RN (Registered Nurse) on 11/23/24 at 1420  Med List Status: <None>   Medication Order Taking? Sig  Documenting Provider Last Dose Status Informant  acetaminophen  (TYLENOL ) 500 MG tablet [id number] Yes Take 1,000-1,500 mg by mouth every 8 (eight) hours as needed for moderate pain. [provider]  Active Self           Med Note HASSEL, [facility]   Fri May 08, 2023  1:58 PM) EMMI/TOC call unable to reconcile medications  allopurinol  (ZYLOPRIM ) 100 MG tablet [id number] Yes Take 100 mg by mouth daily. [provider]  Active Self  aspirin  EC 81 MG tablet [id number] Yes Take 1 tablet (81 mg total) by mouth daily at 6 (six) AM. Swallow whole. Baglia, Corrina, PA-C  Active   augmented betamethasone dipropionate (DIPROLENE-AF) 0.05 % cream [id number] Yes Apply 1 Application topically 2 (two) times daily as needed (irritation). [provider]  Active Self  azelastine  (ASTELIN ) 0.1 % nasal spray [id number] Yes Place 2 sprays into both nostrils 2 (two) times daily. Use in each nostril as directed Okey Burns, MD  Active Self  Carboxymethylcellulose Sodium (REFRESH TEARS OP) [id number] Yes Place 1 drop into both eyes daily as needed (dry eyes). [provider]  Active Self  clopidogrel  (PLAVIX ) 75 MG tablet [id number] Yes TAKE 1 TABLET (75 MG TOTAL) BY MOUTH DAILY. Ladona Heinz, MD  Active Self  empagliflozin  (JARDIANCE ) 25 MG TABS tablet [id number]  Take 1 tablet (25 mg total) by mouth daily before breakfast.  Patient not taking: Reported on 11/23/2024   Ganji, Jay, MD  Active Self  ezetimibe  (ZETIA ) 10 MG tablet [id number] Yes TAKE 1 TABLET EVERY EVENING Ganji, Jay, MD  Active Self  famotidine  (PEPCID ) 20 MG tablet [id number] Yes Take 1 tablet (20 mg total) by mouth 2 (two) times daily. Soldatova, Liuba, MD  Active Self  furosemide (LASIX) 40 MG tablet [id number] Yes Take 40  mg by mouth daily as needed for edema. [provider]  Active Self  ipratropium (ATROVENT ) 0.03 % nasal spray [id number] Yes Place 2 sprays into both nostrils every 12 (twelve) hours. Soldatova,  Liuba, MD  Active Self  labetalol  (NORMODYNE ) 300 MG tablet [id number] Yes Take 300 mg by mouth 2 (two) times daily. [provider]  Active Self  levocetirizine (XYZAL ) 5 MG tablet [id number] Yes TAKE 1 TABLET EVERY EVENING Soldatova, Liuba, MD  Active Self  linaclotide  (LINZESS ) 145 MCG CAPS capsule [id number] Yes Take 145 mcg by mouth daily as needed (constipation). [provider]  Active Self  losartan-hydrochlorothiazide (HYZAAR) 100-25 MG tablet [id number] Yes Take 1 tablet by mouth daily. [provider]  Active Self  oxyCODONE  (OXY IR/ROXICODONE ) 5 MG immediate release tablet [id number]  Take 1 tablet (5 mg total) by mouth every 6 (six) hours as needed for severe pain (pain score 7-10).  Patient not taking: Reported on 11/23/2024   Baglia, Corrina, PA-C  Active   PARoxetine  (PAXIL ) 20 MG tablet [id number] Yes Take 20 mg by mouth daily. [provider]  Active Self  rosuvastatin  (CRESTOR ) 40 MG tablet [id number] Yes Take 1 tablet (40 mg total) by mouth daily. Ladona Heinz, MD  Active Self  Semaglutide, 2 MG/DOSE, (OZEMPIC, 2 MG/DOSE,) 8 MG/3ML SOPN [id number] Yes Inject 2 mg into the skin once a week. [provider]  Active Self  TRESIBA FLEXTOUCH 200 UNIT/ML FlexTouch Pen [id number] Yes Inject 25 Units into the skin daily. [provider]  Active Self  Vitamin D, Ergocalciferol, (DRISDOL) 1.25 MG (50000 UNIT) CAPS capsule [id number] Yes Take 50,000 Units by mouth every Wednesday. [provider]  Active Self            Recommendation:   Continue Current Plan of Care  Follow Up Plan:   Telephone follow up appointment date/time:  12/02/24 at 2:45 pm  Warren Quivers RN CM Population Health-Complex Care Management [facility] [phone]

## 2024-12-02 ENCOUNTER — Telehealth

## 2024-12-05 ENCOUNTER — Encounter (INDEPENDENT_AMBULATORY_CARE_PROVIDER_SITE_OTHER): Admitting: Ophthalmology
